# Patient Record
Sex: Female | Born: 1935
Health system: Southern US, Community
[De-identification: ages and names within clinical notes are randomized; demographics above are authoritative.]

## PROBLEM LIST (undated history)

## (undated) DIAGNOSIS — L0390 Cellulitis, unspecified: Secondary | ICD-10-CM

## (undated) HISTORY — PX: PR VEIN BYPASS GRAFT,AORTO-FEM-POP: 35551

## (undated) HISTORY — DX: Cellulitis, unspecified: L03.90

---

## 2008-09-23 ENCOUNTER — Ambulatory Visit: Payer: Self-pay | Admitting: Vascular Surgery

## 2008-11-04 ENCOUNTER — Ambulatory Visit: Payer: Self-pay | Admitting: Vascular Surgery

## 2008-11-25 ENCOUNTER — Ambulatory Visit: Payer: Self-pay | Admitting: Vascular Surgery

## 2008-12-01 ENCOUNTER — Ambulatory Visit: Payer: Self-pay | Admitting: Vascular Surgery

## 2008-12-01 ENCOUNTER — Ambulatory Visit (HOSPITAL_COMMUNITY): Admission: RE | Admit: 2008-12-01 | Discharge: 2008-12-01 | Payer: Self-pay | Admitting: Vascular Surgery

## 2008-12-16 ENCOUNTER — Ambulatory Visit: Payer: Self-pay | Admitting: Vascular Surgery

## 2008-12-19 ENCOUNTER — Inpatient Hospital Stay (HOSPITAL_COMMUNITY): Admission: RE | Admit: 2008-12-19 | Discharge: 2008-12-21 | Payer: Self-pay | Admitting: Vascular Surgery

## 2009-01-06 ENCOUNTER — Ambulatory Visit: Payer: Self-pay | Admitting: Vascular Surgery

## 2009-02-03 ENCOUNTER — Ambulatory Visit: Payer: Self-pay | Admitting: Vascular Surgery

## 2009-04-02 ENCOUNTER — Ambulatory Visit: Payer: Self-pay | Admitting: Vascular Surgery

## 2009-05-14 ENCOUNTER — Ambulatory Visit: Payer: Self-pay | Admitting: Vascular Surgery

## 2010-07-16 ENCOUNTER — Ambulatory Visit: Payer: Self-pay | Admitting: Vascular Surgery

## 2010-11-16 LAB — CBC
HCT: 30.4 % — ABNORMAL LOW (ref 36.0–46.0)
HCT: 34.3 % — ABNORMAL LOW (ref 36.0–46.0)
Hemoglobin: 10.5 g/dL — ABNORMAL LOW (ref 12.0–15.0)
Hemoglobin: 11.8 g/dL — ABNORMAL LOW (ref 12.0–15.0)
MCHC: 34.5 g/dL (ref 30.0–36.0)
MCHC: 34.7 g/dL (ref 30.0–36.0)
MCV: 94.3 fL (ref 78.0–100.0)
Platelets: 166 10*3/uL (ref 150–400)
Platelets: 211 K/uL (ref 150–400)
RBC: 3.64 MIL/uL — ABNORMAL LOW (ref 3.87–5.11)
RDW: 13.8 % (ref 11.5–15.5)
RDW: 13.9 % (ref 11.5–15.5)
WBC: 7.1 K/uL (ref 4.0–10.5)

## 2010-11-16 LAB — COMPREHENSIVE METABOLIC PANEL WITH GFR
ALT: 11 U/L (ref 0–35)
AST: 15 U/L (ref 0–37)
Albumin: 3.5 g/dL (ref 3.5–5.2)
Alkaline Phosphatase: 101 U/L (ref 39–117)
BUN: 13 mg/dL (ref 6–23)
CO2: 23 meq/L (ref 19–32)
Calcium: 9 mg/dL (ref 8.4–10.5)
Chloride: 111 meq/L (ref 96–112)
Creatinine, Ser: 1.05 mg/dL (ref 0.4–1.2)
GFR calc Af Amer: >60 mL/min (ref >60)
GFR calc non Af Amer: 52 mL/min — ABNORMAL LOW (ref >60)
Glucose, Bld: 112 mg/dL — ABNORMAL HIGH (ref 70–99)
Potassium: 4.1 meq/L (ref 3.5–5.1)
Sodium: 141 meq/L (ref 135–145)
Total Bilirubin: 0.5 mg/dL (ref 0.3–1.2)
Total Protein: 5.9 g/dL — ABNORMAL LOW (ref 6.0–8.3)

## 2010-11-16 LAB — BASIC METABOLIC PANEL
BUN: 13 mg/dL (ref 6–23)
CO2: 26 mEq/L (ref 19–32)
Calcium: 8.6 mg/dL (ref 8.4–10.5)
GFR calc non Af Amer: >60 mL/min (ref >60)
Glucose, Bld: 116 mg/dL — ABNORMAL HIGH (ref 70–99)
Potassium: 3.7 mEq/L (ref 3.5–5.1)
Sodium: 140 mEq/L (ref 135–145)

## 2010-11-16 LAB — URINALYSIS, ROUTINE W REFLEX MICROSCOPIC
Glucose, UA: NEGATIVE mg/dL
Hgb urine dipstick: NEGATIVE
Ketones, ur: NEGATIVE mg/dL
Nitrite: NEGATIVE
Protein, ur: NEGATIVE mg/dL
Specific Gravity, Urine: 1.023 (ref 1.005–1.030)
Urobilinogen, UA: 1 mg/dL (ref 0.0–1.0)
pH: 6 (ref 5.0–8.0)

## 2010-11-16 LAB — APTT: aPTT: 29 seconds (ref 24–37)

## 2010-11-16 LAB — BLOOD GAS, ARTERIAL
Acid-base deficit: 0.2 mmol/L (ref 0.0–2.0)
Bicarbonate: 23.3 meq/L (ref 20.0–24.0)
Drawn by: 181601
FIO2: 0.21 %
O2 Saturation: 97.4 %
Patient temperature: 98.6
TCO2: 24.4 mmol/L (ref 0–100)
pCO2 arterial: 34.5 mmHg — ABNORMAL LOW (ref 35.0–45.0)
pH, Arterial: 7.445 — ABNORMAL HIGH (ref 7.350–7.400)
pO2, Arterial: 85.3 mmHg (ref 80.0–100.0)

## 2010-11-16 LAB — ABO/RH: ABO/RH(D): A POS

## 2010-11-16 LAB — TYPE AND SCREEN: Antibody Screen: NEGATIVE

## 2010-11-16 LAB — PROTIME-INR: INR: 1 (ref 0.00–1.49)

## 2010-11-17 LAB — POCT I-STAT, CHEM 8
Calcium, Ion: 1.08 mmol/L — ABNORMAL LOW (ref 1.12–1.32)
Glucose, Bld: 96 mg/dL (ref 70–99)
HCT: 41 % (ref 36.0–46.0)
Hemoglobin: 13.9 g/dL (ref 12.0–15.0)

## 2010-12-21 NOTE — Assessment & Plan Note (Signed)
OFFICE VISIT   DEMIA, VIERA  DOB:  06/16/1936                                       01/06/2009  ZOXWR#:[id number]   I saw the patient in the office today for followup after her recent left  femoral to below knee pop bypass with a 6 mm Propaten graft on  12/19/2008.  This is a 75 year old woman who I have been following with  a punctate wound adjacent to her left medial malleolus.  She had  combined arterial insufficiency and chronic venous insufficiency and the  wound was not progressing.  She previously had a vein taken from both  legs and did not have any usable saphenous vein on either side by vein  mapping.  She underwent the bypass on 12/19/2008 and returns for her  first followup visit.   PHYSICAL EXAMINATION:  On examination she has a palpable femoral and  popliteal pulse and palpable dorsalis pedis pulse in the left.  The  wound looks much better in terms of the color of the granulation tissue  and it measures 1.9 cm in maximum diameter.   I have stressed with her the importance of compression therapy and  elevation given that this is a venous ulcer.  I will see her back in 1  month.  She knows to call sooner if she has problems.   Di Kindle. Edilia Bo, M.D.  Electronically Signed   CSD/MEDQ  D:  01/06/2009  T:  01/07/2009  Job:  [id number]

## 2010-12-21 NOTE — Assessment & Plan Note (Signed)
OFFICE VISIT   SISTER, [name]  DOB:  February 12, 1936                                       11/25/2008  ZOXWR#:[id number]   I saw the patient for continued follow-up of her left leg wound.  I have  been following her with a punctate wound on the aspect of her left foot.  She has had a toe pressure of 67 mmHg, suggesting adequate circulation  for healing despite the fact that she has evidence of infrainguinal  arterial occlusive disease on exam.  She comes in for a 3-week follow-up  of this.  She had no significant fever or chills.  She had no  significant drainage.  She had been doing dressing changes with  hydrogel.   On examination, the wound looks the same and measures 19 mm x 15 mm and  has thus not really improved over the last 3 weeks.  For this reason I  have recommended that we proceed with arteriography to see what options  she might have for revascularization.  As noted previously, she has  previously had vein taken from both legs and so our options for  revascularization will be limited by this.  We have also discussed the  possibility of finding an SFA lesion amenable to angioplasty.  She would  like to discuss this with her family and then will call to schedule her  arteriogram.  We have discussed the indications for arteriography and  the potential complications including but not limited to bleeding,  arterial injury and renal insufficiency.  All of her questions were  answered.  The only other possibility would be to place a VAC on the  wound, which I have discussed with her.   Tamara Kindle. [name], M.D.  Electronically Signed   CSD/MEDQ  D:  11/25/2008  T:  11/26/2008  Job:  [id number]

## 2010-12-21 NOTE — Assessment & Plan Note (Signed)
OFFICE VISIT   Tamara Johnson, Tamara Johnson  DOB:  09-28-1935                                       05/14/2009  EAVWU#:[id number]   I saw the patient for continued followup of her left medial malleolar  wound.  This is a 75 year old woman who had a punctate wound adjacent to  her left medial malleolus for some time.  She has both combined arterial  insufficiency and chronic venous insufficiency.  Her arteriogram showed  a long segment superficial femoral artery occlusion and she underwent a  left femoral to below knee popliteal artery bypass with a 6 mm Propaten  graft on 12/19/2008.  Of note, she had undergone vein mapping which did  not show any usable saphenous vein for autogenous conduit.  She comes in  for a routine wound check.  Of note, she has had no fever or chills.  She has had no chest pain or chest pressure.   PHYSICAL EXAMINATION:  Blood pressure is 158/82, heart rate is 61.  She  has a palpable left femoral pulse.  I cannot palpate a popliteal pulse  but she has fairly brisk popliteal, anterior tibial and posterior tibial  and peroneal signal at the Doppler.  The wound has epithelialized with  really no open area at this point.   At this point I have recommended that she keep the skin well lubricated  with Eucerin but she did not need to continue any dressing changes as  the wound has now epithelialized.  We will follow her fem-pop graft on  our protocol and I plan on seeing her back in 6 months.  She knows to  call sooner if she has problems.  Unfortunately she does continue to  smoke and she understands this can certainly affect graft patency and  wound healing.   Di Kindle. Edilia Bo, M.D.  Electronically Signed   CSD/MEDQ  D:  05/14/2009  T:  05/15/2009  Job:  [id number]

## 2010-12-21 NOTE — Consult Note (Signed)
NEW PATIENT CONSULTATION   CALYNN, FERRERO  DOB:  April 04, 1936                                       09/23/2008  ZOXWR#:[id number]   I saw this patient in the office today in consultation concerning a  wound on her left foot.  This is a pleasant 75 year old woman who 2  weeks ago developed a small punctate wound adjacent to her medial  malleolus on the left foot.  She had been doing dressing changes for  this.  She was noted to have diminished pedal pulses and sent for  vascular evaluation.  She had a Doppler study done at [facility]  which showed an ABI of 60% on the right and 79% on the left with  monophasic Doppler signals in both feet.   Of note, the patient does give a 2-year history of calf claudication in  the right calf which occurs at less than half a block.  These symptoms  have been stable over the last 2 years.  There are no other aggravating  or alleviating factors associated with this.  She has also had some  occasional paresthesias along the lateral aspect of the right leg which  she has also had for 2 years.  She denies any history of rest pain.  She  did previously have an ulcer on the lateral left leg which healed.  She  does have a history of a venous disease.  She had multiple previous  DVTs.  Her most recent DVT was in the left leg approximately 5 years  ago.  She was on Coumadin temporarily but is currently not on Coumadin.   PAST MEDICAL HISTORY:  Fairly unremarkable.  She denies any history of  diabetes, hypertension, hypercholesterolemia, history of previous  myocardial infarction, history of congestive heart failure, or history  of COPD.   FAMILY HISTORY:  There is no history of premature cardiovascular  disease.   SOCIAL HISTORY:  She is married.  She has 1 child.  She smokes a pack  per day of cigarettes, although she is currently trying to quit.  She is  taking Chantix for this.   REVIEW OF SYSTEMS:  Unremarkable except for  her claudication and  nonhealing ulcer, and is documented on the medical history form in her  chart.   MEDICATIONS:  Likewise her medications are documented.   PHYSICAL EXAMINATION:  This is a pleasant 75 year old woman who appears  her stated age.  Blood pressure is 137/77, heart rate is 64.  HEENT:  Unremarkable.  Neck is supple.  No cervical lymphadenopathy.  I do not  detect any carotid bruits.  Lungs are clear bilaterally to auscultation.  Cardiac exam she has a regular rate and rhythm.  Abdomen:  Soft and  nontender.  She has normal pitched bowel sounds.  I cannot palpate an  aneurysm.  She has a slightly diminished right femoral pulse with normal  left femoral pulse.  I cannot palpate popliteal or pedal pulses on  either side.  She has a monophasic dorsalis pedis and posterior tibial  signal bilaterally.  She has a small punctate 5 mm wound adjacent her  left medial malleolus with no significant drainage or cellulitis  currently.  Neurologic:  Exam is nonfocal.   She did have a Doppler study at [facility] that showed an ABI of  60% on the  right and 79% on the left.   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  However, appears that she has adequate circulation to heal  this.  I have explained that if it does not heal, I think we need to  proceed with arteriography.  I think it is probably a venous stasis  ulcer.  She has significant chronic venous insufficiency.  I have  discussed the importance of some mild compression along with her  dressing changes and also intermittent leg elevation.  If her peripheral  vascular disease prohibits her from elevating her leg, this might be  another reason to consider proceeding with arteriography.  However, she  has currently only had the wound for 2 weeks and it is quite small so I  think it is reasonable to simply check  this again in 6 week.  She knows  to call sooner if the wound progresses.  Hopefully, this will gradually  improve without any further intervention.  We have also had a long  discussion about the importance of tobacco cessation.   Di Kindle. Edilia Bo, M.D.  Electronically Signed   CSD/MEDQ  D:  09/23/2008  T:  09/24/2008  Job:  [id number]   cc:   Sharyne Peach

## 2010-12-21 NOTE — Assessment & Plan Note (Signed)
OFFICE VISIT   Tamara Johnson, Tamara Johnson  DOB:  Aug 19, 1935                                       11/04/2008  JWJXB#:[id number]   I saw the patient in the office today for continued followup of her left  foot wound.  I had seen her in consultation on September 23, 2008, with a  small punctate wound adjacent to her left medial malleolus.  At that  time she had a normal femoral pulse with monophasic pedal signals and it  was felt she had infrainguinal arterial occlusive disease bilaterally.  I felt she likely had adequate circulation to heal this wound, but she  comes in for a routine visit.  Unfortunately, since I saw her last, her  husband was on a ventilator for 11 days in the hospital and she spent a  tremendous amount of time at the hospital on her feet and the wound has  progressed some.  She comes in today for a followup visit.  She had no  fever or chills.   On examination, she has a normal femoral pulse on the left with  monophasic Doppler signals in the left foot.  She had ABIs done in the  office today which were 57% on the right and 67% on the left.  Of note,  her toe pressure on the left was 67 mmHg.  The wound measures 2 cm x 1.5  cm in diameter.  There was some fibrin tissue which I tried to debride,  although it is somewhat tender.   I have encouraged her to try to spend less time on her feet now that her  husband is doing better and to continue with dressing changes with  hydrogel daily.  I think the wound has increased in size related to her  being on her feet so much and will have her also use a 4-inch Ace for  mild compression of the wound.  I plan on seeing her back in 3 weeks.  If this wound progresses, I think we need to proceed with arteriography  and evaluate her for a bypass.  She has had a vein previously taken from  both legs but could potentially benefit from prosthetic bypass.  Of  note, however, her toe pressure is 67 mmHg, which does  suggest adequate  circulation for healing.  Hopefully, the wound will start to show some  signs of improvement now that she can stay off her feet some.  She knows  to call sooner if she has problems.   Di Kindle. Edilia Bo, M.D.  Electronically Signed   CSD/MEDQ  D:  11/04/2008  T:  11/05/2008  Job:  [id number]   cc:   Sharyne Peach

## 2010-12-21 NOTE — Assessment & Plan Note (Signed)
OFFICE VISIT   AVION, PATELLA  DOB:  Sep 02, 1935                                       02/03/2009  ZOXWR#:[id number]   I saw the patient in the office today for continued followup of her  wound on her left foot on the medial malleolus.  When I saw her last  this measured 1.9 cm in maximum diameter.  She comes in today for a  wound check.  She is doing well except that her husband is now being  evaluated by hospice.  She has not been elevating her leg as much as  normal for this reason.  She has had no significant drainage and no  fever.   PHYSICAL EXAMINATION:  On examination she has a 1+ dorsalis pedis pulse  on the left foot.  The foot is warm and well-perfused.  This wound still  measures 1.9 cm in maximum diameter and is thus not changed  significantly in size.  For this reason I have recommended we try the  VAC to help stimulate granulation tissue and hopefully get this to  contract quicker.  We will arrange for this to be changed on Mondays,  Wednesdays and Fridays and I will see her back in 2 weeks.  She knows to  call sooner if she has problems.   Di Kindle. Edilia Bo, M.D.  Electronically Signed   CSD/MEDQ  D:  02/03/2009  T:  02/04/2009  Job:  [id number]

## 2010-12-21 NOTE — Op Note (Signed)
Tamara Johnson, Tamara Johnson NO.:  [id number]   MEDICAL RECORD NO.:  [id number]          PATIENT TYPE:  INP   LOCATION:  2024                         FACILITY:  [facility]   PHYSICIAN:  Di Kindle. Edilia Bo, M.D.DATE OF BIRTH:  [birth year]   DATE OF PROCEDURE:  12/19/2008  DATE OF DISCHARGE:                               OPERATIVE REPORT   PREOPERATIVE DIAGNOSIS:  Nonhealing wound of the left leg with  superficial femoral artery occlusion.   POSTOPERATIVE DIAGNOSIS:  Nonhealing wound of the left leg with  superficial femoral artery occlusion.   PROCEDURE:  Left femoral to below-knee popliteal artery bypass graft  with a 6-mm PROPATEN graft and intraoperative arteriogram.   SURGEON:  Di Kindle. Edilia Bo, MD   ASSISTANT:  Billee Cashing, RNFA   ANESTHESIA:  General.   INDICATIONS:  This is a 75 year old woman who I have been following with  a punctate wound adjacent to her left medial malleolus.  She has  combined arterial insufficiency and chronic venous insufficiency.  The  wound failed to heal despite the toe pressure of 67, which suggested she  had reasonable circulation.  An arteriogram was obtained, which showed a  superficial femoral artery occlusion with reconstitution of the  popliteal artery at the level of the knee.  Fem-pop bypass grafting was  recommended as her best chance for limb salvage.  Of note, she has  significant chronic venous insufficiency and has had both saphenous vein  stripped from her legs.  Vein mapping did not show any usable saphenous  vein for autogenous conduit.  The procedure and potential complications  including, but not limited to bleeding, wound healing problems, graft  thrombosis and limb loss were all discussed with the patient  preoperatively.  All her questions were answered.  She was agreeable to  proceed.   TECHNIQUE:  The patient was taken to the operating room and received a  general anesthetic.  The left lower extremity  was prepped and draped in  usual sterile fashion.  After the incision was made longitudinally in  the left groin, the common femoral artery was dissected free along with  one posterior branch.  There was an excellent pulse here.  I did ligate  some venous branches in the groin to lower the venous pressure given her  significant chronic venous insufficiency.  Separate longitudinal  incision was made below-the-knee on the medial aspect of leg and the  popliteal space was entered.  The below-knee popliteal artery was  exposed and was patent.  A tunnel was created between the 2 incisions,  and a 6-mm PROPATEN graft tunneled between the 2 incisions.  The patient  was then heparinized.  Attention was first turned to the proximal  anastomosis.  The common femoral artery was clamped proximally and  distally and a longitudinal arteriotomy was made.  The PROPATEN graft  was spatulated and sewn end-to-side to the common femoral artery using  continuous 6-0 Prolene suture.  The proximal anastomosis was flushed  first and there was an excellent inflow.  Anastomosis was completed and  the graft clamped.  The graft  was pulled to the appropriate tension for  anastomosis to the below-knee popliteal artery.  A tourniquet was placed  on the thigh.  The leg was exsanguinated with an Esmarch bandage and the  tourniquet inflated to 250 mmHg.  Under tourniquet control, a  longitudinal arteriotomy was made in the below-knee popliteal artery.  The graft was cut to appropriate length, spatulated and sewn end-to-side  to the artery using continuous 6-0 Prolene suture.  At the completion,  there was an excellent signal with the foot with the Doppler, which was  graft dependent.  Hemostasis was obtained in the wounds, and the heparin  was partially reversed with protamine.  Below-knee incision was closed  with a deep layer of 3-0 Vicryl and the skin closed with 4-0  subcuticular stitch.  The groin incision was closed  with a deep layer of  2-0 Vicryl.  Subcutaneous layer was closed with 2-0 Vicryl.  The skin  was closed with a 4-0 subcuticular stitch.  Sterile dressing was  applied.  The patient tolerated the procedure well and was transferred  to the recovery room in satisfactory condition.  All needle and sponge  counts were correct.      Di Kindle. Edilia Bo, M.D.  Electronically Signed     CSD/MEDQ  D:  12/19/2008  T:  12/20/2008  Job:  [id number]

## 2010-12-21 NOTE — H&P (Signed)
HISTORY AND PHYSICAL EXAMINATION   Dec 16, 2008   Re:  Tamara Johnson, BEHAN                  DOB:  19-Oct-1935   REASON FOR ADMISSION:  Nonhealing wound of the left foot.   HISTORY:  This is a pleasant 75 year old woman who I have been following  with a punctate wound adjacent to her left medial malleolus.  I  originally saw her in consultation on 09/23/2008.  She has a toe  pressure on the left at 67 mmHg suggesting adequate circulation for  healing.  However, the wound really did not show any significant  improvement despite aggressive outpatient care.  She underwent an  arteriogram on 12/01/2008 which showed a long segment occlusion of her  superficial femoral artery with reconstitution of her below knee  popliteal artery and three vessel runoff on the left.  On her most  recent visit today, 12/16/2008, the wound has not shown any significant  improvement.  She did undergo vein mapping today and there was no usable  saphenous vein remaining, as she had vein taken from both legs.  We also  looked at her cephalic veins which were small on both sides and not  usable.  She is admitted for fem-pop bypass grafting as her best chance  for limb salvage.   PAST MEDICAL HISTORY:  Is fairly unremarkable.  She denies any history  of diabetes, hypertension, hypercholesterolemia, history of previous  myocardial infarction, history of congestive heart failure or history of  COPD.   FAMILY HISTORY:  There is no history of premature cardiovascular  disease.   SOCIAL HISTORY:  She is married.  She has one child.  She smokes a pack  per day of cigarettes although she is in the process of trying to quit.   REVIEW OF SYSTEMS:  GENERAL:  She has had no recent weight loss, weight  gain or problems with her appetite.  CARDIAC:  She has had no chest pain, chest pressure, palpitations or  arrhythmias.  PULMONARY:  She has had no productive cough, bronchitis, asthma or  wheezing.  GI:   She has had no recent change in her bowel habits and has no history  of peptic ulcer disease.  GU:  She has had no dysuria or frequency.  VASCULAR:  She has had no history of DVT or phlebitis.  She has had  bilateral lower extremity claudication more significantly on the left  side.  She has had no history of stroke, TIAs or amaurosis fugax.  NEURO:  She has had no dizziness, blackouts, headaches or seizures.  ORTHO:  She has had no arthritis, joint pain, muscle pain or rash.  ENT:  She has had no recent change in her eyesight.  No change in her  hearing.  HEMATOLOGIC:  She has had no bleeding problems or clotting disorders.   ALLERGIES:  Aspirin.   MEDICATIONS:  Zyrtec and Chantix.   PHYSICAL EXAMINATION:  This is a pleasant 75 year old woman who appears  her stated age.  Her blood pressure is 149/69, heart rate is 70.  HEENT  is unremarkable.  I do not detect any carotid bruits.  Neck is supple.  Lungs are clear bilaterally to auscultation.  On cardiac exam she has a  regular rate and rhythm.  Her abdomen is soft and nontender.  She has  normal pitched bowel sounds.  I cannot palpate an aneurysm.  She has  palpable femoral pulses bilaterally.  I cannot palpate popliteal or  pedal pulses on either side.  She has monophasic Doppler signals in her  feet bilaterally.  She has a 2 cm in diameter wound adjacent to the  medial malleolus of the left foot.   Despite aggressive outpatient wound care the wound has really not shown  any evidence of improvement and actually has gotten slightly larger.  I  feel the only chance for limb salvage is attempted revascularization.  Unfortunately she does not have adequate vein for an autogenous bypass.  I have recommended left femoral to below knee popliteal artery bypass  grafting.  We have discussed the indications for the procedure and the  potential complications including but not limited to graft thrombosis,  wound healing problems, bleeding and  limb loss.  All of her questions  were answered and she is agreeable to proceed.  Her surgery has been  scheduled for May 14.   Di Kindle. Edilia Bo, M.D.  Electronically Signed   CSD/MEDQ  D:  12/16/2008  T:  12/17/2008  Job:  [id number]

## 2010-12-21 NOTE — Assessment & Plan Note (Signed)
OFFICE VISIT   Tamara Johnson, Tamara Johnson  DOB:  May 22, 1936                                       04/02/2009  OZHYQ#:[id number]   I saw the patient in the office today for continued followup of her left  medial malleolar wound.  She has had a left fem-pop bypass graft which  was done with a 6 mm Propaten graft on 12/19/2008.  She had a VAC on  this wound and comes in today for a routine wound check.  Of note, since  I saw her last she lost her husband who was in hospice 22 days before  dying.   On examination blood pressure is 124/79, heart rate is 66.  The foot is  warm and well-perfused.  The wound measures 1 cm in maximum diameter.   I debrided some fibrin tissue today and the wound appears reasonably  well-perfused.  At this point I think we will switch to wet-to-dry  dressing changes with a 2x2 to stimulate granulation and hopefully this  will now epithelialize and completely heal.  She feels comfortable doing  the dressing changes so she will do this twice a day.  I will see her  back in 4 weeks.  She knows to call sooner if she has problems.   Di Kindle. Edilia Bo, M.D.  Electronically Signed   CSD/MEDQ  D:  04/02/2009  T:  04/03/2009  Job:  [id number]

## 2010-12-21 NOTE — Op Note (Signed)
NAMEMAUDE, HETTICH                ACCOUNT NO.:  [id number]   MEDICAL RECORD NO.:  [id number]          PATIENT TYPE:  AMB   LOCATION:  SDS                          FACILITY:  [facility]   PHYSICIAN:  Di Kindle. Edilia Bo, M.D.DATE OF BIRTH:  19-Dec-1935   DATE OF PROCEDURE:  12/01/2008  DATE OF DISCHARGE:                               OPERATIVE REPORT   PREOPERATIVE DIAGNOSIS:  Nonhealing wound of the left foot with  infrainguinal arterial occlusive disease.   POSTOPERATIVE DIAGNOSIS:  Nonhealing wound of the left foot with  infrainguinal arterial occlusive disease.   PROCEDURES:  1. Ultrasound-guided access to the right common femoral artery.  2. Aortogram with bilateral lower extremity runoff.  3. Selective catheterization of the left external iliac artery.  4. Femoral artery closure with Perclose ProGlide device.   INDICATIONS:  This is a pleasant 75 year old woman who I have been  following with a punctate wound adjacent to her left medial malleolus.  This has not really shown any evidence of shrinkage despite aggressive  outpatient wound care.  She has evidence of infrainguinal arterial  occlusive disease on exam, although she had a toe pressure of 67 mmHg,  the wound has not really improved.  She is brought in for diagnostic  arteriography to determine if she is a candidate for revascularization.   TECHNIQUE:  The patient was taken to the PV lab and sedated with a  milligram of Versed and 50 mcg of fentanyl.  Both groins were prepped  and draped in the usual sterile fashion.  The skin was anesthetized with  1% lidocaine and under ultrasound guidance, the right common femoral  artery was cannulated and a guidewire introduced into the infrarenal  aorta under fluoroscopic control.  A 5-French sheath was introduced over  the wire.  Pigtail catheter was positioned at the L1 vertebral body and  flush aortogram obtained.  The catheter was then repositioned above the  aortic  bifurcation and oblique iliac projections were obtained.  The  pigtail catheter was then exchanged for an IMA catheter, which was  positioned into the proximal left common iliac artery.  An angled  Glidewire was advanced down into the external iliac artery on the left  and then the IMA catheter exchanged for the end-hole catheter.  Selective left external iliac arteriogram was obtained with left lower  extremity runoff.  The end-hole catheter was then removed and right  lower extremity films were obtained through the right femoral sheath.  At the completion of the procedure, the 5-French sheath was exchanged  for the Perclose device, which was advanced over the wire.  The wire was  then removed and then the catheter advanced until there was good blood  return from the marker port.  The foot was then deployed and the  catheter retracted until blood to the marker port stopped.  The suture  was then cut and then the foot disengaged and the catheter retracted.  Attention was maintained on the rail suture and then this was tightened  using the Enclose device.  The suture was then cut and there was good  hemostasis obtained.  Pressure was held for 2 minutes.   FINDINGS:  There were single renal arteries bilaterally with no  significant renal artery stenosis identified.  There is mild diffuse  disease in the infrarenal aorta, but no focal stenosis.  Bilateral  common iliac, external iliac, and hypogastric arteries are patent  bilaterally.   On the left side, the superficial femoral artery is occluded at its  origin.  There is reconstitution of the popliteal artery at the level of  the knee with 3-vessel runoff via the anterior tibial, peroneal, and  posterior tibial arteries.  There are extensive collaterals.  On the  right side, the superficial femoral artery is occluded at its origin.  The common femoral and deep femoral arteries are patent.  There was  reconstitution of the above-knee  popliteal artery with some mild disease  above the knee.  There was three-vessel runoff on the right via the  anterior tibial, posterior tibial, and peroneal arteries.   CONCLUSIONS:  1. Long segment SFA occlusion on the left with patent below-knee      popliteal and three-vessel runoff.  2. Long segment SFA occlusion on the right with patent popliteal      artery and three-vessel runoff.      Di Kindle. Edilia Bo, M.D.  Electronically Signed     CSD/MEDQ  D:  12/01/2008  T:  12/01/2008  Job:  [id number]   cc:   Sharyne Peach

## 2010-12-21 NOTE — Procedures (Signed)
VASCULAR LAB EXAM   INDICATION:  Preop vein mapping, bilateral lower extremity vein  stripping per patient.   HISTORY:  Diabetes:  No.  Cardiac:  No.  Hypertension:  No.   EXAM:  Bilateral greater saphenous vein, short saphenous vein, and  cephalic veins were mapped.   See attached worksheet.   IMPRESSION:  1. Right greater saphenous vein shows evidence of stripping.  2. Left greater saphenous vein shows evidence of stripping with a      short straight segment in the proximal thigh and a very short      segment in proximal calf.  3. Bilateral short saphenous veins appear very small, dropping to <0.2      cm by midcalf.  4. Bilateral cephalic veins appear <0.2 cm throughout.   ___________________________________________  Di Kindle. Edilia Bo, M.D.   AS/MEDQ  D:  12/16/2008  T:  12/16/2008  Job:  [id number]

## 2010-12-24 NOTE — Discharge Summary (Signed)
NAMELASSIE, DEMOREST NO.:  [id number]   MEDICAL RECORD NO.:  [id number]          PATIENT TYPE:  INP   LOCATION:  2024                         FACILITY:  [facility]   PHYSICIAN:  Di Kindle. Edilia Bo, M.D.DATE OF BIRTH:  [birth year]   DATE OF ADMISSION:  12/19/2008  DATE OF DISCHARGE:  12/21/2008                               DISCHARGE SUMMARY   FINAL DISCHARGE DIAGNOSES:  1. Ischemic left lower extremity resulting in nonhealing wound      adjacent to the left medial malleolus.  2. History of tobacco abuse.   PROCEDURES PERFORMED:  Left femoral to below-knee popliteal artery  bypass grafting with a 6-mm Propaten graft and intraoperative  arteriogram by Dr. Edilia Bo on Dec 19, 2008.   COMPLICATIONS:  None.   CONDITION AT DISCHARGE:  Stable improving.   DISCHARGE MEDICATIONS:  She is instructed to resume all previously  scheduled medications consisting of Zyrtec p.r.n., Chantix 1 mg p.o.  b.i.d.  She was given a prescription for Tylox 1-2 p.o. q.4 h. p.r.n.  pain total #30 were given.   DISPOSITION:  Following careful instructions regarding the care of her  wounds and activity level, she was discharged home.  She was given a  return appointment with Dr. Edilia Bo in 2 weeks.  The office will arrange  a visit.   BRIEF IDENTIFYING STATEMENT:  For complete details, please refer the  typed history and physical.  Briefly, this very pleasant 75 year old  woman has a fairly unremarkable medical history.  She has developed a  poorly healing wound on her left medial malleolus.  Arteriogram  demonstrated long segment occlusion of her superficial femoral artery on  her left.  She had good reconstitution with three-vessel runoff below  the knee.  Dr. Edilia Bo recommended left femoral to popliteal bypass  grafting to improve her wound healing ability.  She was informed of the  risks and benefits of the procedure and after careful consideration he  elected to proceed with  surgery.   HOSPITAL COURSE:  Preoperative workup was completed as an outpatient.  She was brought in through same-day surgery on Dec 19, 2008 and  underwent the aforementioned revascularization procedure.  For complete  details, please refer the typed operative report.  The procedure was  without complication.  She was returned to the post anesthesia care unit  extubated.  Following stabilization, she was transferred to a bed on a  surgical step-down unit.  She was observed overnight and was able to be transferred to a bed on a  surgical convalescent floor.  Following her transfer, her diet and  activity level were increased as tolerated.  She was able to walk with  the use of a rolling walker.  She was felt stable and was discharged  home on Dec 21, 2008.      Wilmon Arms, PA      Di Kindle. Edilia Bo, M.D.  Electronically Signed    KEL/MEDQ  D:  12/22/2008  T:  12/23/2008  Job:  [id number]

## 2012-06-28 ENCOUNTER — Encounter: Payer: Self-pay | Admitting: Vascular Surgery

## 2013-10-02 DIAGNOSIS — L259 Unspecified contact dermatitis, unspecified cause: Secondary | ICD-10-CM | POA: Diagnosis not present

## 2013-12-17 DIAGNOSIS — L97909 Non-pressure chronic ulcer of unspecified part of unspecified lower leg with unspecified severity: Secondary | ICD-10-CM | POA: Diagnosis not present

## 2013-12-17 DIAGNOSIS — L981 Factitial dermatitis: Secondary | ICD-10-CM | POA: Diagnosis not present

## 2013-12-17 DIAGNOSIS — I8310 Varicose veins of unspecified lower extremity with inflammation: Secondary | ICD-10-CM | POA: Diagnosis not present

## 2014-01-01 DIAGNOSIS — L259 Unspecified contact dermatitis, unspecified cause: Secondary | ICD-10-CM | POA: Diagnosis not present

## 2014-01-01 DIAGNOSIS — L97909 Non-pressure chronic ulcer of unspecified part of unspecified lower leg with unspecified severity: Secondary | ICD-10-CM | POA: Diagnosis not present

## 2014-01-08 DIAGNOSIS — D1801 Hemangioma of skin and subcutaneous tissue: Secondary | ICD-10-CM | POA: Diagnosis not present

## 2014-01-08 DIAGNOSIS — L570 Actinic keratosis: Secondary | ICD-10-CM | POA: Diagnosis not present

## 2014-01-08 DIAGNOSIS — L97909 Non-pressure chronic ulcer of unspecified part of unspecified lower leg with unspecified severity: Secondary | ICD-10-CM | POA: Diagnosis not present

## 2014-01-08 DIAGNOSIS — L820 Inflamed seborrheic keratosis: Secondary | ICD-10-CM | POA: Diagnosis not present

## 2014-01-15 DIAGNOSIS — L97909 Non-pressure chronic ulcer of unspecified part of unspecified lower leg with unspecified severity: Secondary | ICD-10-CM | POA: Diagnosis not present

## 2014-01-22 DIAGNOSIS — L97909 Non-pressure chronic ulcer of unspecified part of unspecified lower leg with unspecified severity: Secondary | ICD-10-CM | POA: Diagnosis not present

## 2014-01-29 DIAGNOSIS — L97909 Non-pressure chronic ulcer of unspecified part of unspecified lower leg with unspecified severity: Secondary | ICD-10-CM | POA: Diagnosis not present

## 2014-02-05 DIAGNOSIS — L97909 Non-pressure chronic ulcer of unspecified part of unspecified lower leg with unspecified severity: Secondary | ICD-10-CM | POA: Diagnosis not present

## 2014-02-12 DIAGNOSIS — L97909 Non-pressure chronic ulcer of unspecified part of unspecified lower leg with unspecified severity: Secondary | ICD-10-CM | POA: Diagnosis not present

## 2014-02-19 DIAGNOSIS — L97909 Non-pressure chronic ulcer of unspecified part of unspecified lower leg with unspecified severity: Secondary | ICD-10-CM | POA: Diagnosis not present

## 2014-02-26 DIAGNOSIS — L259 Unspecified contact dermatitis, unspecified cause: Secondary | ICD-10-CM | POA: Diagnosis not present

## 2014-02-26 DIAGNOSIS — L97909 Non-pressure chronic ulcer of unspecified part of unspecified lower leg with unspecified severity: Secondary | ICD-10-CM | POA: Diagnosis not present

## 2014-03-05 DIAGNOSIS — L259 Unspecified contact dermatitis, unspecified cause: Secondary | ICD-10-CM | POA: Diagnosis not present

## 2014-03-05 DIAGNOSIS — L97909 Non-pressure chronic ulcer of unspecified part of unspecified lower leg with unspecified severity: Secondary | ICD-10-CM | POA: Diagnosis not present

## 2014-10-04 DIAGNOSIS — M545 Low back pain: Secondary | ICD-10-CM | POA: Diagnosis not present

## 2014-12-17 DIAGNOSIS — H3531 Nonexudative age-related macular degeneration: Secondary | ICD-10-CM | POA: Diagnosis not present

## 2015-02-12 DIAGNOSIS — H3531 Nonexudative age-related macular degeneration: Secondary | ICD-10-CM | POA: Diagnosis not present

## 2015-04-29 DIAGNOSIS — Z23 Encounter for immunization: Secondary | ICD-10-CM | POA: Diagnosis not present

## 2015-09-20 DIAGNOSIS — L0390 Cellulitis, unspecified: Secondary | ICD-10-CM | POA: Diagnosis not present

## 2015-09-28 ENCOUNTER — Encounter: Payer: Self-pay | Admitting: Vascular Surgery

## 2015-09-28 ENCOUNTER — Other Ambulatory Visit: Payer: Self-pay

## 2015-09-28 DIAGNOSIS — R230 Cyanosis: Secondary | ICD-10-CM

## 2015-10-08 DIAGNOSIS — L255 Unspecified contact dermatitis due to plants, except food: Secondary | ICD-10-CM | POA: Diagnosis not present

## 2015-10-16 ENCOUNTER — Encounter: Payer: Self-pay | Admitting: Vascular Surgery

## 2015-10-19 ENCOUNTER — Ambulatory Visit (HOSPITAL_COMMUNITY)
Admission: RE | Admit: 2015-10-19 | Discharge: 2015-10-19 | Disposition: A | Discharge status: Home / Self Care | Payer: Medicare Other | Source: Ambulatory Visit | Attending: Vascular Surgery | Admitting: Vascular Surgery

## 2015-10-19 ENCOUNTER — Ambulatory Visit (INDEPENDENT_AMBULATORY_CARE_PROVIDER_SITE_OTHER)
Admission: RE | Admit: 2015-10-19 | Discharge: 2015-10-19 | Disposition: A | Discharge status: Home / Self Care | Payer: Medicare Other | Source: Ambulatory Visit | Attending: Vascular Surgery | Admitting: Vascular Surgery

## 2015-10-19 DIAGNOSIS — R938 Abnormal findings on diagnostic imaging of other specified body structures: Secondary | ICD-10-CM | POA: Insufficient documentation

## 2015-10-19 DIAGNOSIS — R230 Cyanosis: Secondary | ICD-10-CM | POA: Insufficient documentation

## 2015-10-19 DIAGNOSIS — R0989 Other specified symptoms and signs involving the circulatory and respiratory systems: Secondary | ICD-10-CM | POA: Diagnosis present

## 2015-10-21 ENCOUNTER — Ambulatory Visit (INDEPENDENT_AMBULATORY_CARE_PROVIDER_SITE_OTHER): Payer: Medicare Other | Admitting: Vascular Surgery

## 2015-10-21 ENCOUNTER — Encounter: Payer: Self-pay | Admitting: Vascular Surgery

## 2015-10-21 VITALS — BP 136/75 | HR 76 | Temp 97.1°F | Resp 18 | Ht 66.6 in | Wt 133.0 lb

## 2015-10-21 DIAGNOSIS — I739 Peripheral vascular disease, unspecified: Secondary | ICD-10-CM | POA: Diagnosis not present

## 2015-10-21 DIAGNOSIS — L03116 Cellulitis of left lower limb: Secondary | ICD-10-CM | POA: Diagnosis not present

## 2015-10-21 NOTE — Progress Notes (Signed)
Referring Physician: Dr.Hawk History of Present Illness:  Patient is a 80 y.o. year old female who presents for evaluation of cellulitis bilateral LE.  She had itching that started 6 weeks ago and began to scatch her legs enough that they became red.  She saw her family doctor who ordered Keflex BID with triamcinolone cream topical for the itching.    She was sent her for F/U to evaluate her Fem-pop by pass that was performed by Dr. Scot Dock in 2010.  She states she walks as much aas she wants and has no calf pain.  She has not had any open ulcers since her surgery reoccur.  She completely healed her medial ankle ulcer that was present prior to her fem-pop by pass.  Other medical problems include  does not have a problem list on file.  She denise HTN, DM, hyperlipidemia.  She does still smoke.  Past Medical History  Diagnosis Date  . Cellulitis     Past Surgical History  Procedure Laterality Date  . Pr vein bypass graft,aorto-fem-pop      Social History Social History  Substance Use Topics  . Smoking status: Current Every Day Smoker -- 1.00 packs/day    Types: Cigarettes  . Smokeless tobacco: Never Used  . Alcohol Use: No    Family History Family History  Problem Relation Age of Onset  . Heart attack Father     Allergies  Allergies  Allergen Reactions  . Asa [Aspirin]      Current Outpatient Prescriptions  Medication Sig Dispense Refill  . Multiple Vitamins-Minerals (ICAPS AREDS 2) CAPS Take by mouth.    . triamcinolone ointment (KENALOG) 0.5 %   0   No current facility-administered medications for this visit.    ROS:   General:  No weight loss, Fever, chills  HEENT: No recent headaches, no nasal bleeding, no visual changes, no sore throat  Neurologic: No dizziness, blackouts, seizures. No recent symptoms of stroke or mini- stroke. No recent episodes of slurred speech, or temporary blindness.  Cardiac: No recent episodes of chest pain/pressure, no shortness  of breath at rest.  No shortness of breath with exertion.  Denies history of atrial fibrillation or irregular heartbeat  Vascular: No history of rest pain in feet.  No history of claudication.  No history of non-healing ulcer, No history of DVT   Pulmonary: No home oxygen, no productive cough, no hemoptysis,  No asthma or wheezing  Musculoskeletal:  [ ]  Arthritis, [ ]  Low back pain,  [ ]  Joint pain  Hematologic:No history of hypercoagulable state.  No history of easy bleeding.  No history of anemia  Gastrointestinal: No hematochezia or melena,  No gastroesophageal reflux, no trouble swallowing  Urinary: [ ]  chronic Kidney disease, [ ]  on HD - [ ]  MWF or [ ]  TTHS, [ ]  Burning with urination, [ ]  Frequent urination, [ ]  Difficulty urinating;   Skin: positive rashes  Psychological: No history of anxiety,  No history of depression   Physical Examination  Filed Vitals:   10/21/15 1430  BP: 136/75  Pulse: 76  Temp: 97.1 F (36.2 C)  Resp: 18  Height: 5' 6.6" (1.692 m)  Weight: 133 lb (60.328 kg)  SpO2: 98%    Body mass index is 21.07 kg/(m^2).  General:  Alert and oriented, no acute distress HEENT: Normal Neck: No bruit or JVD Pulmonary: Clear to auscultation bilaterally Cardiac: Regular Rate and Rhythm without murmur Abdomen: Soft, non-tender, non-distended, no mass, no  scars Skin: bilateral mild erythema with excoriations on the lower legs.  No ulcers, medical ankle well healed scar. Extremity Pulses:  2+ radial, brachial, femoral, dorsalis pedis,  pulses bilaterally Musculoskeletal: No deformity or edema  Neurologic: Upper and lower extremity motor 5/5 and symmetric  DATA:   ABI's 0.6 bilaterally with toe pressure <0.69 bilaterally Duplex shows no flow in the left fem-pop by pass graft.   ASSESSMENT:  PAD s/p fem-pop by pass left LE. Cellulitis healing with the aide of Keflex times 7 days and topical triamcinolone.    PLAN:   The left fem-pop is occluded.  She  has developed collaterals over time due to her activity level and walking daily.  She has DP palpable pulses bilaterally.  She has no op ulcers and the erythema is improving per her and her son.    She will f/u in 1 year for repeat ABI's.  If she has troubles or concerns before that she will call us sooner.  Continue activity as tolerates.  Continue to keep skin moisturized daily.  She may want to try benadryl topical as an adjunct.   We recommend that she stop smoking.  Theda Sers Reiana [facility] PA-C [facility] Office: [phone]  The patient was seen in conjunction with Dr. Scot Dock

## 2016-02-10 DIAGNOSIS — L300 Nummular dermatitis: Secondary | ICD-10-CM | POA: Diagnosis not present

## 2016-02-10 DIAGNOSIS — L299 Pruritus, unspecified: Secondary | ICD-10-CM | POA: Diagnosis not present

## 2016-02-10 DIAGNOSIS — I8310 Varicose veins of unspecified lower extremity with inflammation: Secondary | ICD-10-CM | POA: Diagnosis not present

## 2016-03-14 DIAGNOSIS — L209 Atopic dermatitis, unspecified: Secondary | ICD-10-CM | POA: Diagnosis not present

## 2016-03-14 DIAGNOSIS — L97911 Non-pressure chronic ulcer of unspecified part of right lower leg limited to breakdown of skin: Secondary | ICD-10-CM | POA: Diagnosis not present

## 2016-03-14 DIAGNOSIS — L299 Pruritus, unspecified: Secondary | ICD-10-CM | POA: Diagnosis not present

## 2016-03-21 DIAGNOSIS — L97911 Non-pressure chronic ulcer of unspecified part of right lower leg limited to breakdown of skin: Secondary | ICD-10-CM | POA: Diagnosis not present

## 2016-03-21 DIAGNOSIS — L299 Pruritus, unspecified: Secondary | ICD-10-CM | POA: Diagnosis not present

## 2016-03-21 DIAGNOSIS — B86 Scabies: Secondary | ICD-10-CM | POA: Diagnosis not present

## 2016-04-04 DIAGNOSIS — L300 Nummular dermatitis: Secondary | ICD-10-CM | POA: Diagnosis not present

## 2016-04-04 DIAGNOSIS — L97911 Non-pressure chronic ulcer of unspecified part of right lower leg limited to breakdown of skin: Secondary | ICD-10-CM | POA: Diagnosis not present

## 2016-04-18 DIAGNOSIS — L97911 Non-pressure chronic ulcer of unspecified part of right lower leg limited to breakdown of skin: Secondary | ICD-10-CM | POA: Diagnosis not present

## 2016-05-11 DIAGNOSIS — Z23 Encounter for immunization: Secondary | ICD-10-CM | POA: Diagnosis not present

## 2016-08-14 DIAGNOSIS — J0100 Acute maxillary sinusitis, unspecified: Secondary | ICD-10-CM | POA: Diagnosis not present

## 2016-10-14 ENCOUNTER — Encounter: Payer: Self-pay | Admitting: Vascular Surgery

## 2016-10-25 ENCOUNTER — Other Ambulatory Visit: Payer: Self-pay

## 2016-10-25 DIAGNOSIS — I739 Peripheral vascular disease, unspecified: Secondary | ICD-10-CM

## 2016-10-26 ENCOUNTER — Inpatient Hospital Stay (HOSPITAL_COMMUNITY): Admission: RE | Admit: 2016-10-26 | Payer: Medicare Other | Source: Ambulatory Visit

## 2016-10-26 ENCOUNTER — Ambulatory Visit: Payer: Medicare Other | Admitting: Vascular Surgery

## 2017-06-14 DIAGNOSIS — Z23 Encounter for immunization: Secondary | ICD-10-CM | POA: Diagnosis not present

## 2018-06-16 DIAGNOSIS — Z79899 Other long term (current) drug therapy: Secondary | ICD-10-CM | POA: Diagnosis not present

## 2018-06-16 DIAGNOSIS — F17210 Nicotine dependence, cigarettes, uncomplicated: Secondary | ICD-10-CM | POA: Diagnosis not present

## 2018-06-16 DIAGNOSIS — K5190 Ulcerative colitis, unspecified, without complications: Secondary | ICD-10-CM | POA: Diagnosis not present

## 2018-06-16 DIAGNOSIS — J45909 Unspecified asthma, uncomplicated: Secondary | ICD-10-CM | POA: Diagnosis not present

## 2018-06-16 DIAGNOSIS — K529 Noninfective gastroenteritis and colitis, unspecified: Secondary | ICD-10-CM | POA: Diagnosis not present

## 2018-06-25 DIAGNOSIS — Z1339 Encounter for screening examination for other mental health and behavioral disorders: Secondary | ICD-10-CM | POA: Diagnosis not present

## 2018-06-25 DIAGNOSIS — R110 Nausea: Secondary | ICD-10-CM | POA: Diagnosis not present

## 2018-06-25 DIAGNOSIS — K529 Noninfective gastroenteritis and colitis, unspecified: Secondary | ICD-10-CM | POA: Diagnosis not present

## 2018-06-25 DIAGNOSIS — Z1331 Encounter for screening for depression: Secondary | ICD-10-CM | POA: Diagnosis not present

## 2018-06-25 DIAGNOSIS — Z9181 History of falling: Secondary | ICD-10-CM | POA: Diagnosis not present

## 2018-06-25 DIAGNOSIS — Z23 Encounter for immunization: Secondary | ICD-10-CM | POA: Diagnosis not present

## 2018-07-11 DIAGNOSIS — K591 Functional diarrhea: Secondary | ICD-10-CM | POA: Diagnosis not present

## 2018-07-17 DIAGNOSIS — D128 Benign neoplasm of rectum: Secondary | ICD-10-CM | POA: Diagnosis not present

## 2018-07-17 DIAGNOSIS — F17210 Nicotine dependence, cigarettes, uncomplicated: Secondary | ICD-10-CM | POA: Diagnosis not present

## 2018-07-17 DIAGNOSIS — R935 Abnormal findings on diagnostic imaging of other abdominal regions, including retroperitoneum: Secondary | ICD-10-CM | POA: Diagnosis not present

## 2018-07-17 DIAGNOSIS — K648 Other hemorrhoids: Secondary | ICD-10-CM | POA: Diagnosis not present

## 2018-07-17 DIAGNOSIS — D122 Benign neoplasm of ascending colon: Secondary | ICD-10-CM | POA: Diagnosis not present

## 2018-07-17 DIAGNOSIS — R197 Diarrhea, unspecified: Secondary | ICD-10-CM | POA: Diagnosis not present

## 2018-07-17 DIAGNOSIS — Z79899 Other long term (current) drug therapy: Secondary | ICD-10-CM | POA: Diagnosis not present

## 2018-07-17 DIAGNOSIS — K621 Rectal polyp: Secondary | ICD-10-CM | POA: Diagnosis not present

## 2018-07-17 DIAGNOSIS — Z800 Family history of malignant neoplasm of digestive organs: Secondary | ICD-10-CM | POA: Diagnosis not present

## 2018-07-17 DIAGNOSIS — Z1211 Encounter for screening for malignant neoplasm of colon: Secondary | ICD-10-CM | POA: Diagnosis not present

## 2019-01-22 DIAGNOSIS — L853 Xerosis cutis: Secondary | ICD-10-CM | POA: Diagnosis not present

## 2019-01-22 DIAGNOSIS — R5383 Other fatigue: Secondary | ICD-10-CM | POA: Diagnosis not present

## 2019-01-22 DIAGNOSIS — Z79899 Other long term (current) drug therapy: Secondary | ICD-10-CM | POA: Diagnosis not present

## 2019-01-22 DIAGNOSIS — D692 Other nonthrombocytopenic purpura: Secondary | ICD-10-CM | POA: Diagnosis not present

## 2019-02-21 DIAGNOSIS — B351 Tinea unguium: Secondary | ICD-10-CM | POA: Diagnosis not present

## 2019-02-21 DIAGNOSIS — B353 Tinea pedis: Secondary | ICD-10-CM | POA: Diagnosis not present

## 2019-03-13 DIAGNOSIS — L97911 Non-pressure chronic ulcer of unspecified part of right lower leg limited to breakdown of skin: Secondary | ICD-10-CM | POA: Diagnosis not present

## 2019-03-13 DIAGNOSIS — L0390 Cellulitis, unspecified: Secondary | ICD-10-CM | POA: Diagnosis not present

## 2019-03-13 DIAGNOSIS — L97921 Non-pressure chronic ulcer of unspecified part of left lower leg limited to breakdown of skin: Secondary | ICD-10-CM | POA: Diagnosis not present

## 2019-03-20 DIAGNOSIS — L97911 Non-pressure chronic ulcer of unspecified part of right lower leg limited to breakdown of skin: Secondary | ICD-10-CM | POA: Diagnosis not present

## 2019-03-20 DIAGNOSIS — L0390 Cellulitis, unspecified: Secondary | ICD-10-CM | POA: Diagnosis not present

## 2019-03-27 DIAGNOSIS — L97911 Non-pressure chronic ulcer of unspecified part of right lower leg limited to breakdown of skin: Secondary | ICD-10-CM | POA: Diagnosis not present

## 2019-03-27 DIAGNOSIS — L0390 Cellulitis, unspecified: Secondary | ICD-10-CM | POA: Diagnosis not present

## 2019-03-27 DIAGNOSIS — L97921 Non-pressure chronic ulcer of unspecified part of left lower leg limited to breakdown of skin: Secondary | ICD-10-CM | POA: Diagnosis not present

## 2019-04-03 DIAGNOSIS — L97911 Non-pressure chronic ulcer of unspecified part of right lower leg limited to breakdown of skin: Secondary | ICD-10-CM | POA: Diagnosis not present

## 2019-04-03 DIAGNOSIS — L97921 Non-pressure chronic ulcer of unspecified part of left lower leg limited to breakdown of skin: Secondary | ICD-10-CM | POA: Diagnosis not present

## 2019-04-10 DIAGNOSIS — L570 Actinic keratosis: Secondary | ICD-10-CM | POA: Diagnosis not present

## 2019-04-10 DIAGNOSIS — C44729 Squamous cell carcinoma of skin of left lower limb, including hip: Secondary | ICD-10-CM | POA: Diagnosis not present

## 2019-04-10 DIAGNOSIS — L97921 Non-pressure chronic ulcer of unspecified part of left lower leg limited to breakdown of skin: Secondary | ICD-10-CM | POA: Diagnosis not present

## 2019-04-10 DIAGNOSIS — L97911 Non-pressure chronic ulcer of unspecified part of right lower leg limited to breakdown of skin: Secondary | ICD-10-CM | POA: Diagnosis not present

## 2019-04-18 DIAGNOSIS — L97921 Non-pressure chronic ulcer of unspecified part of left lower leg limited to breakdown of skin: Secondary | ICD-10-CM | POA: Diagnosis not present

## 2019-04-18 DIAGNOSIS — L97911 Non-pressure chronic ulcer of unspecified part of right lower leg limited to breakdown of skin: Secondary | ICD-10-CM | POA: Diagnosis not present

## 2019-04-22 DIAGNOSIS — L97911 Non-pressure chronic ulcer of unspecified part of right lower leg limited to breakdown of skin: Secondary | ICD-10-CM | POA: Diagnosis not present

## 2019-04-22 DIAGNOSIS — L97921 Non-pressure chronic ulcer of unspecified part of left lower leg limited to breakdown of skin: Secondary | ICD-10-CM | POA: Diagnosis not present

## 2019-05-08 DIAGNOSIS — L97921 Non-pressure chronic ulcer of unspecified part of left lower leg limited to breakdown of skin: Secondary | ICD-10-CM | POA: Diagnosis not present

## 2019-05-08 DIAGNOSIS — L97911 Non-pressure chronic ulcer of unspecified part of right lower leg limited to breakdown of skin: Secondary | ICD-10-CM | POA: Diagnosis not present

## 2019-05-08 DIAGNOSIS — L570 Actinic keratosis: Secondary | ICD-10-CM | POA: Diagnosis not present

## 2019-05-15 DIAGNOSIS — L570 Actinic keratosis: Secondary | ICD-10-CM | POA: Diagnosis not present

## 2019-05-15 DIAGNOSIS — L820 Inflamed seborrheic keratosis: Secondary | ICD-10-CM | POA: Diagnosis not present

## 2019-05-15 DIAGNOSIS — L97921 Non-pressure chronic ulcer of unspecified part of left lower leg limited to breakdown of skin: Secondary | ICD-10-CM | POA: Diagnosis not present

## 2019-05-15 DIAGNOSIS — L97911 Non-pressure chronic ulcer of unspecified part of right lower leg limited to breakdown of skin: Secondary | ICD-10-CM | POA: Diagnosis not present

## 2019-05-22 DIAGNOSIS — L97921 Non-pressure chronic ulcer of unspecified part of left lower leg limited to breakdown of skin: Secondary | ICD-10-CM | POA: Diagnosis not present

## 2019-05-22 DIAGNOSIS — L97911 Non-pressure chronic ulcer of unspecified part of right lower leg limited to breakdown of skin: Secondary | ICD-10-CM | POA: Diagnosis not present

## 2019-05-29 DIAGNOSIS — L578 Other skin changes due to chronic exposure to nonionizing radiation: Secondary | ICD-10-CM | POA: Diagnosis not present

## 2019-05-29 DIAGNOSIS — L97911 Non-pressure chronic ulcer of unspecified part of right lower leg limited to breakdown of skin: Secondary | ICD-10-CM | POA: Diagnosis not present

## 2019-05-29 DIAGNOSIS — L97921 Non-pressure chronic ulcer of unspecified part of left lower leg limited to breakdown of skin: Secondary | ICD-10-CM | POA: Diagnosis not present

## 2019-05-29 DIAGNOSIS — L570 Actinic keratosis: Secondary | ICD-10-CM | POA: Diagnosis not present

## 2019-06-05 DIAGNOSIS — L97911 Non-pressure chronic ulcer of unspecified part of right lower leg limited to breakdown of skin: Secondary | ICD-10-CM | POA: Diagnosis not present

## 2019-06-05 DIAGNOSIS — L578 Other skin changes due to chronic exposure to nonionizing radiation: Secondary | ICD-10-CM | POA: Diagnosis not present

## 2019-06-12 DIAGNOSIS — L570 Actinic keratosis: Secondary | ICD-10-CM | POA: Diagnosis not present

## 2019-06-12 DIAGNOSIS — L97911 Non-pressure chronic ulcer of unspecified part of right lower leg limited to breakdown of skin: Secondary | ICD-10-CM | POA: Diagnosis not present

## 2019-07-01 DIAGNOSIS — Z23 Encounter for immunization: Secondary | ICD-10-CM | POA: Diagnosis not present

## 2019-08-29 DIAGNOSIS — L97911 Non-pressure chronic ulcer of unspecified part of right lower leg limited to breakdown of skin: Secondary | ICD-10-CM | POA: Diagnosis not present

## 2019-09-05 DIAGNOSIS — L97911 Non-pressure chronic ulcer of unspecified part of right lower leg limited to breakdown of skin: Secondary | ICD-10-CM | POA: Diagnosis not present

## 2019-09-09 ENCOUNTER — Telehealth: Payer: Self-pay

## 2019-09-09 NOTE — Telephone Encounter (Signed)
Pt called requesting an appt for a non-healing area on R ankle. She stated she has been seeing dermatology for this for weeks. Pt has not been here for more than 2 years, a new pt appt has been scheduled for her.

## 2019-09-12 DIAGNOSIS — L97911 Non-pressure chronic ulcer of unspecified part of right lower leg limited to breakdown of skin: Secondary | ICD-10-CM | POA: Diagnosis not present

## 2019-09-12 DIAGNOSIS — L0390 Cellulitis, unspecified: Secondary | ICD-10-CM | POA: Diagnosis not present

## 2019-09-18 DIAGNOSIS — L97911 Non-pressure chronic ulcer of unspecified part of right lower leg limited to breakdown of skin: Secondary | ICD-10-CM | POA: Diagnosis not present

## 2019-09-25 DIAGNOSIS — L97911 Non-pressure chronic ulcer of unspecified part of right lower leg limited to breakdown of skin: Secondary | ICD-10-CM | POA: Diagnosis not present

## 2019-10-02 ENCOUNTER — Other Ambulatory Visit: Payer: Self-pay

## 2019-10-02 ENCOUNTER — Encounter: Payer: Self-pay | Admitting: Vascular Surgery

## 2019-10-02 ENCOUNTER — Ambulatory Visit (INDEPENDENT_AMBULATORY_CARE_PROVIDER_SITE_OTHER): Payer: Medicare Other | Admitting: Vascular Surgery

## 2019-10-02 ENCOUNTER — Ambulatory Visit (HOSPITAL_COMMUNITY)
Admission: RE | Admit: 2019-10-02 | Discharge: 2019-10-02 | Disposition: A | Discharge status: Home / Self Care | Payer: Medicare Other | Source: Ambulatory Visit | Attending: Vascular Surgery | Admitting: Vascular Surgery

## 2019-10-02 ENCOUNTER — Other Ambulatory Visit: Payer: Self-pay | Admitting: *Deleted

## 2019-10-02 VITALS — BP 176/75 | HR 78 | Temp 98.0°F | Resp 20 | Ht 66.0 in | Wt 116.0 lb

## 2019-10-02 DIAGNOSIS — L97909 Non-pressure chronic ulcer of unspecified part of unspecified lower leg with unspecified severity: Secondary | ICD-10-CM | POA: Diagnosis not present

## 2019-10-02 DIAGNOSIS — R0989 Other specified symptoms and signs involving the circulatory and respiratory systems: Secondary | ICD-10-CM | POA: Diagnosis not present

## 2019-10-02 DIAGNOSIS — I739 Peripheral vascular disease, unspecified: Secondary | ICD-10-CM | POA: Insufficient documentation

## 2019-10-02 DIAGNOSIS — I70299 Other atherosclerosis of native arteries of extremities, unspecified extremity: Secondary | ICD-10-CM | POA: Diagnosis not present

## 2019-10-02 DIAGNOSIS — F17210 Nicotine dependence, cigarettes, uncomplicated: Secondary | ICD-10-CM | POA: Diagnosis not present

## 2019-10-02 NOTE — Progress Notes (Signed)
REASON FOR CONSULT:    Nonhealing wound of right ankle.   ASSESSMENT & PLAN:   ATHEROSCLEROSIS WITH NONHEALING ULCER RIGHT LEG: Given that this patient has had this ulcer for 8 to 10 months according to the son and has evidence of significant infrainguinal arterial occlusive disease, I think this could become a limb threatening problem.  I have recommended that we proceed with arteriography to see what options we might have for revascularization.  Hopefully she would be a candidate for endovascular approach given that she is 51.  I spent more than 3 minutes with her discussing the importance of tobacco cessation.  We discussed its role in contributing to atherosclerosis and also how it affects the microcirculation with respect to vasospasm.  She will make an effort to cut back on cigarettes and will discuss options with her primary care physician also.  I have reviewed with the patient the indications for arteriography. In addition, I have reviewed the potential complications of arteriography including but not limited to: Bleeding, arterial injury, arterial thrombosis, dye action, renal insufficiency, or other unpredictable medical problems. I have explained to the patient that if we find disease amenable to angioplasty we could potentially address this at the same time. I have discussed the potential complications of angioplasty and stenting, including but not limited to: Bleeding, arterial thrombosis, arterial injury, dissection, or the need for surgical intervention.  This has been scheduled for Friday, 10/04/2019  RIGHT CAROTID BRUIT: She does have a right carotid bruit and next time she returns to the office she will need a carotid duplex scan which I can order after her arteriogram on Friday.   Deitra Mayo, MD Office: [phone]   HPI:   Tamara Johnson is a pleasant 84 y.o. female, who has previously undergone a left femoropopliteal bypass graft in 2010.  This graft is chronically  occluded.  She has had ulcers on both ankles but the ulcer on the right ankle according to the son has been present for 8 to 10 months.  Patient says it has been present for 4 months.  This was treated with an Unna boot but continues to persist on the right although the ulcer on the left is healed.  She does describe some calf claudication bilaterally and also rest pain of the right foot.  Her risk factors for peripheral vascular disease include hypertension, a family history of premature cardiovascular disease, and tobacco use.  She smokes a pack per day of cigarettes and has been smoking for over 40 years.  Past Medical History:  Diagnosis Date  . Cellulitis     Family History  Problem Relation Age of Onset  . Heart attack Father     SOCIAL HISTORY: Social History   Socioeconomic History  . Marital status: Married    Spouse name: Not on file  . Number of children: Not on file  . Years of education: Not on file  . Highest education level: Not on file  Occupational History  . Not on file  Tobacco Use  . Smoking status: Current Every Day Smoker    Packs/day: 1.00    Types: Cigarettes  . Smokeless tobacco: Never Used  Substance and Sexual Activity  . Alcohol use: No    Alcohol/week: 0.0 standard drinks  . Drug use: No  . Sexual activity: Not on file  Other Topics Concern  . Not on file  Social History Narrative  . Not on file   Social Determinants of Health  Financial Resource Strain:   . Difficulty of Paying Living Expenses: Not on file  Food Insecurity:   . Worried About Charity fundraiser in the Last Year: Not on file  . Ran Out of Food in the Last Year: Not on file  Transportation Needs:   . Lack of Transportation (Medical): Not on file  . Lack of Transportation (Non-Medical): Not on file  Physical Activity:   . Days of Exercise per Week: Not on file  . Minutes of Exercise per Session: Not on file  Stress:   . Feeling of Stress : Not on file  Social  Connections:   . Frequency of Communication with Friends and Family: Not on file  . Frequency of Social Gatherings with Friends and Family: Not on file  . Attends Religious Services: Not on file  . Active Member of Clubs or Organizations: Not on file  . Attends Archivist Meetings: Not on file  . Marital Status: Not on file  Intimate Partner Violence:   . Fear of Current or Ex-Partner: Not on file  . Emotionally Abused: Not on file  . Physically Abused: Not on file  . Sexually Abused: Not on file    Allergies  Allergen Reactions  . Asa [Aspirin]     Current Outpatient Medications  Medication Sig Dispense Refill  . fexofenadine (ALLEGRA) 60 MG tablet Take 60 mg by mouth 2 (two) times daily.    . Multiple Vitamins-Minerals (ICAPS AREDS 2) CAPS Take by mouth.     No current facility-administered medications for this visit.    REVIEW OF SYSTEMS:  [X]  denotes positive finding, [ ]  denotes negative finding Cardiac  Comments:  Chest pain or chest pressure:    Shortness of breath upon exertion:    Short of breath when lying flat:    Irregular heart rhythm:        Vascular    Pain in calf, thigh, or hip brought on by ambulation: x   Pain in feet at night that wakes you up from your sleep:  x   Blood clot in your veins:    Leg swelling:         Pulmonary    Oxygen at home:    Productive cough:     Wheezing:         Neurologic    Sudden weakness in arms or legs:     Sudden numbness in arms or legs:     Sudden onset of difficulty speaking or slurred speech:    Temporary loss of vision in one eye:     Problems with dizziness:         Gastrointestinal    Blood in stool:     Vomited blood:         Genitourinary    Burning when urinating:     Blood in urine:        Psychiatric    Major depression:         Hematologic    Bleeding problems:    Problems with blood clotting too easily:        Skin    Rashes or ulcers: x       Constitutional    Fever or  chills:     PHYSICAL EXAM:   Vitals:   10/02/19 1050  BP: (!) 176/75  Pulse: 78  Resp: 20  Temp: 98 F (36.7 C)  SpO2: 97%  Weight: 116 lb (52.6 kg)  Height: 5\' 6"  (1.676  m)    GENERAL: The patient is a well-nourished female, in no acute distress. The vital signs are documented above. CARDIAC: There is a regular rate and rhythm.  VASCULAR: She has a right carotid bruit. On the right side, which is the side of concern she has a palpable femoral pulse.  I cannot palpate pedal pulses. On the left side she has a palpable femoral pulse.  I cannot palpate pedal pulses. She has no significant lower extremity swelling. PULMONARY: There is good air exchange bilaterally without wheezing or rales. ABDOMEN: Soft and non-tender with normal pitched bowel sounds.  I do not palpate an abdominal aortic aneurysm. MUSCULOSKELETAL: There are no major deformities or cyanosis. NEUROLOGIC: No focal weakness or paresthesias are detected. SKIN: She has a small wound on her right lateral malleolus as documented below.    PSYCHIATRIC: The patient has a normal affect.  DATA:    ARTERIAL DOPPLER STUDY: I have independently interpreted her arterial Doppler study today.  On the right side, which is the side of concern, there are monophasic Doppler signals in the dorsalis pedis and posterior tibial position.  ABI is 46%.  Toe pressure is 48 mmHg.  On the left side there a monophasic dorsalis pedis and posterior tibial signals.  ABI is 49%.  Toe pressures 51 mmHg.  LABS: I do not see any recent labs.  We will have to check her renal function prior to her arteriogram.  Her GFR was normal back in 2010.

## 2019-10-02 NOTE — H&P (View-Only) (Signed)
REASON FOR CONSULT:    Nonhealing wound of right ankle.   ASSESSMENT & PLAN:   ATHEROSCLEROSIS WITH NONHEALING ULCER RIGHT LEG: Given that this patient has had this ulcer for 8 to 10 months according to the son and has evidence of significant infrainguinal arterial occlusive disease, I think this could become a limb threatening problem.  I have recommended that we proceed with arteriography to see what options we might have for revascularization.  Hopefully she would be a candidate for endovascular approach given that she is 11.  I spent more than 3 minutes with her discussing the importance of tobacco cessation.  We discussed its role in contributing to atherosclerosis and also how it affects the microcirculation with respect to vasospasm.  She will make an effort to cut back on cigarettes and will discuss options with her primary care physician also.  I have reviewed with the patient the indications for arteriography. In addition, I have reviewed the potential complications of arteriography including but not limited to: Bleeding, arterial injury, arterial thrombosis, dye action, renal insufficiency, or other unpredictable medical problems. I have explained to the patient that if we find disease amenable to angioplasty we could potentially address this at the same time. I have discussed the potential complications of angioplasty and stenting, including but not limited to: Bleeding, arterial thrombosis, arterial injury, dissection, or the need for surgical intervention.  This has been scheduled for Friday, 10/04/2019  RIGHT CAROTID BRUIT: She does have a right carotid bruit and next time she returns to the office she will need a carotid duplex scan which I can order after her arteriogram on Friday.   Deitra Mayo, MD Office: [phone]   HPI:   Tamara Johnson is a pleasant 84 y.o. female, who has previously undergone a left femoropopliteal bypass graft in 2010.  This graft is chronically  occluded.  She has had ulcers on both ankles but the ulcer on the right ankle according to the son has been present for 8 to 10 months.  Patient says it has been present for 4 months.  This was treated with an Unna boot but continues to persist on the right although the ulcer on the left is healed.  She does describe some calf claudication bilaterally and also rest pain of the right foot.  Her risk factors for peripheral vascular disease include hypertension, a family history of premature cardiovascular disease, and tobacco use.  She smokes a pack per day of cigarettes and has been smoking for over 40 years.  Past Medical History:  Diagnosis Date  . Cellulitis     Family History  Problem Relation Age of Onset  . Heart attack Father     SOCIAL HISTORY: Social History   Socioeconomic History  . Marital status: Married    Spouse name: Not on file  . Number of children: Not on file  . Years of education: Not on file  . Highest education level: Not on file  Occupational History  . Not on file  Tobacco Use  . Smoking status: Current Every Day Smoker    Packs/day: 1.00    Types: Cigarettes  . Smokeless tobacco: Never Used  Substance and Sexual Activity  . Alcohol use: No    Alcohol/week: 0.0 standard drinks  . Drug use: No  . Sexual activity: Not on file  Other Topics Concern  . Not on file  Social History Narrative  . Not on file   Social Determinants of Health  Financial Resource Strain:   . Difficulty of Paying Living Expenses: Not on file  Food Insecurity:   . Worried About Charity fundraiser in the Last Year: Not on file  . Ran Out of Food in the Last Year: Not on file  Transportation Needs:   . Lack of Transportation (Medical): Not on file  . Lack of Transportation (Non-Medical): Not on file  Physical Activity:   . Days of Exercise per Week: Not on file  . Minutes of Exercise per Session: Not on file  Stress:   . Feeling of Stress : Not on file  Social  Connections:   . Frequency of Communication with Friends and Family: Not on file  . Frequency of Social Gatherings with Friends and Family: Not on file  . Attends Religious Services: Not on file  . Active Member of Clubs or Organizations: Not on file  . Attends Archivist Meetings: Not on file  . Marital Status: Not on file  Intimate Partner Violence:   . Fear of Current or Ex-Partner: Not on file  . Emotionally Abused: Not on file  . Physically Abused: Not on file  . Sexually Abused: Not on file    Allergies  Allergen Reactions  . Asa [Aspirin]     Current Outpatient Medications  Medication Sig Dispense Refill  . fexofenadine (ALLEGRA) 60 MG tablet Take 60 mg by mouth 2 (two) times daily.    . Multiple Vitamins-Minerals (ICAPS AREDS 2) CAPS Take by mouth.     No current facility-administered medications for this visit.    REVIEW OF SYSTEMS:  [X]  denotes positive finding, [ ]  denotes negative finding Cardiac  Comments:  Chest pain or chest pressure:    Shortness of breath upon exertion:    Short of breath when lying flat:    Irregular heart rhythm:        Vascular    Pain in calf, thigh, or hip brought on by ambulation: x   Pain in feet at night that wakes you up from your sleep:  x   Blood clot in your veins:    Leg swelling:         Pulmonary    Oxygen at home:    Productive cough:     Wheezing:         Neurologic    Sudden weakness in arms or legs:     Sudden numbness in arms or legs:     Sudden onset of difficulty speaking or slurred speech:    Temporary loss of vision in one eye:     Problems with dizziness:         Gastrointestinal    Blood in stool:     Vomited blood:         Genitourinary    Burning when urinating:     Blood in urine:        Psychiatric    Major depression:         Hematologic    Bleeding problems:    Problems with blood clotting too easily:        Skin    Rashes or ulcers: x       Constitutional    Fever or  chills:     PHYSICAL EXAM:   Vitals:   10/02/19 1050  BP: (!) 176/75  Pulse: 78  Resp: 20  Temp: 98 F (36.7 C)  SpO2: 97%  Weight: 116 lb (52.6 kg)  Height: 5\' 6"  (1.676  m)    GENERAL: The patient is a well-nourished female, in no acute distress. The vital signs are documented above. CARDIAC: There is a regular rate and rhythm.  VASCULAR: She has a right carotid bruit. On the right side, which is the side of concern she has a palpable femoral pulse.  I cannot palpate pedal pulses. On the left side she has a palpable femoral pulse.  I cannot palpate pedal pulses. She has no significant lower extremity swelling. PULMONARY: There is good air exchange bilaterally without wheezing or rales. ABDOMEN: Soft and non-tender with normal pitched bowel sounds.  I do not palpate an abdominal aortic aneurysm. MUSCULOSKELETAL: There are no major deformities or cyanosis. NEUROLOGIC: No focal weakness or paresthesias are detected. SKIN: She has a small wound on her right lateral malleolus as documented below.    PSYCHIATRIC: The patient has a normal affect.  DATA:    ARTERIAL DOPPLER STUDY: I have independently interpreted her arterial Doppler study today.  On the right side, which is the side of concern, there are monophasic Doppler signals in the dorsalis pedis and posterior tibial position.  ABI is 46%.  Toe pressure is 48 mmHg.  On the left side there a monophasic dorsalis pedis and posterior tibial signals.  ABI is 49%.  Toe pressures 51 mmHg.  LABS: I do not see any recent labs.  We will have to check her renal function prior to her arteriogram.  Her GFR was normal back in 2010.

## 2019-10-03 ENCOUNTER — Other Ambulatory Visit (HOSPITAL_COMMUNITY)
Admission: RE | Admit: 2019-10-03 | Discharge: 2019-10-03 | Disposition: A | Discharge status: Home / Self Care | Payer: Medicare Other | Source: Ambulatory Visit | Attending: Vascular Surgery | Admitting: Vascular Surgery

## 2019-10-03 DIAGNOSIS — Z01812 Encounter for preprocedural laboratory examination: Secondary | ICD-10-CM | POA: Insufficient documentation

## 2019-10-03 DIAGNOSIS — Z20822 Contact with and (suspected) exposure to covid-19: Secondary | ICD-10-CM | POA: Diagnosis not present

## 2019-10-03 LAB — SARS CORONAVIRUS 2 (TAT 6-24 HRS): SARS Coronavirus 2: NEGATIVE

## 2019-10-04 ENCOUNTER — Ambulatory Visit (HOSPITAL_COMMUNITY)
Admission: RE | Admit: 2019-10-04 | Discharge: 2019-10-04 | Disposition: A | Discharge status: Home / Self Care | Payer: Medicare Other | Attending: Vascular Surgery | Admitting: Vascular Surgery

## 2019-10-04 ENCOUNTER — Encounter (HOSPITAL_COMMUNITY)
Admission: RE | Disposition: A | Discharge status: Home / Self Care | Payer: Self-pay | Source: Home / Self Care | Attending: Vascular Surgery

## 2019-10-04 ENCOUNTER — Other Ambulatory Visit: Payer: Self-pay

## 2019-10-04 ENCOUNTER — Encounter: Payer: Self-pay | Admitting: Vascular Surgery

## 2019-10-04 DIAGNOSIS — F17210 Nicotine dependence, cigarettes, uncomplicated: Secondary | ICD-10-CM | POA: Insufficient documentation

## 2019-10-04 DIAGNOSIS — I10 Essential (primary) hypertension: Secondary | ICD-10-CM | POA: Insufficient documentation

## 2019-10-04 DIAGNOSIS — I70238 Atherosclerosis of native arteries of right leg with ulceration of other part of lower right leg: Secondary | ICD-10-CM | POA: Diagnosis not present

## 2019-10-04 DIAGNOSIS — Z8249 Family history of ischemic heart disease and other diseases of the circulatory system: Secondary | ICD-10-CM | POA: Insufficient documentation

## 2019-10-04 DIAGNOSIS — Z886 Allergy status to analgesic agent status: Secondary | ICD-10-CM | POA: Diagnosis not present

## 2019-10-04 DIAGNOSIS — I70221 Atherosclerosis of native arteries of extremities with rest pain, right leg: Secondary | ICD-10-CM

## 2019-10-04 DIAGNOSIS — L97219 Non-pressure chronic ulcer of right calf with unspecified severity: Secondary | ICD-10-CM | POA: Diagnosis not present

## 2019-10-04 HISTORY — PX: ABDOMINAL AORTOGRAM W/LOWER EXTREMITY: CATH118223

## 2019-10-04 LAB — POCT I-STAT, CHEM 8
BUN: 21 mg/dL (ref 8–23)
Calcium, Ion: 1.15 mmol/L (ref 1.15–1.40)
Chloride: 105 mmol/L (ref 98–111)
Creatinine, Ser: 0.9 mg/dL (ref 0.44–1.00)
Glucose, Bld: 105 mg/dL — ABNORMAL HIGH (ref 70–99)
HCT: 39 % (ref 36.0–46.0)
Hemoglobin: 13.3 g/dL (ref 12.0–15.0)
Potassium: 3.9 mmol/L (ref 3.5–5.1)
Sodium: 141 mmol/L (ref 135–145)
TCO2: 30 mmol/L (ref 22–32)

## 2019-10-04 SURGERY — ABDOMINAL AORTOGRAM W/LOWER EXTREMITY
Anesthesia: LOCAL | Laterality: Bilateral

## 2019-10-04 MED ORDER — LIDOCAINE HCL (PF) 1 % IJ SOLN
INTRAMUSCULAR | Status: AC
  Filled 2019-10-04: qty 30

## 2019-10-04 MED ORDER — SODIUM CHLORIDE 0.9 % IV SOLN: INTRAVENOUS | Status: DC

## 2019-10-04 MED ORDER — MIDAZOLAM HCL 2 MG/2ML IJ SOLN
INTRAMUSCULAR | Status: AC
  Filled 2019-10-04: qty 2

## 2019-10-04 MED ORDER — HYDRALAZINE HCL 20 MG/ML IJ SOLN: 5.0000 mg | INTRAMUSCULAR | Status: DC | PRN

## 2019-10-04 MED ORDER — LABETALOL HCL 5 MG/ML IV SOLN: 10.0000 mg | INTRAVENOUS | Status: DC | PRN

## 2019-10-04 MED ORDER — SODIUM CHLORIDE 0.9 % IV SOLN: 250.0000 mL | INTRAVENOUS | Status: DC | PRN

## 2019-10-04 MED ORDER — HEPARIN (PORCINE) IN NACL 1000-0.9 UT/500ML-% IV SOLN
INTRAVENOUS | Status: AC
  Filled 2019-10-04: qty 500

## 2019-10-04 MED ORDER — MIDAZOLAM HCL 2 MG/2ML IJ SOLN
INTRAMUSCULAR | Status: DC | PRN
  Administered 2019-10-04: 0.5 mg via INTRAVENOUS

## 2019-10-04 MED ORDER — SODIUM CHLORIDE 0.9% FLUSH: 3.0000 mL | INTRAVENOUS | Status: DC | PRN

## 2019-10-04 MED ORDER — LIDOCAINE HCL (PF) 1 % IJ SOLN
INTRAMUSCULAR | Status: DC | PRN
  Administered 2019-10-04: 15 mL via INTRADERMAL

## 2019-10-04 MED ORDER — SODIUM CHLORIDE 0.9 % WEIGHT BASED INFUSION: 1.0000 mL/kg/h | INTRAVENOUS | Status: DC

## 2019-10-04 MED ORDER — ONDANSETRON HCL 4 MG/2ML IJ SOLN: 4.0000 mg | Freq: Four times a day (QID) | INTRAMUSCULAR | Status: DC | PRN

## 2019-10-04 MED ORDER — SODIUM CHLORIDE 0.9% FLUSH: 3.0000 mL | Freq: Two times a day (BID) | INTRAVENOUS | Status: DC

## 2019-10-04 MED ORDER — HEPARIN (PORCINE) IN NACL 1000-0.9 UT/500ML-% IV SOLN
INTRAVENOUS | Status: DC | PRN
  Administered 2019-10-04 (×2): 500 mL

## 2019-10-04 MED ORDER — FENTANYL CITRATE (PF) 100 MCG/2ML IJ SOLN
INTRAMUSCULAR | Status: AC
  Filled 2019-10-04: qty 2

## 2019-10-04 MED ORDER — ACETAMINOPHEN 325 MG PO TABS: 650.0000 mg | ORAL_TABLET | ORAL | Status: DC | PRN

## 2019-10-04 MED ORDER — FENTANYL CITRATE (PF) 100 MCG/2ML IJ SOLN
INTRAMUSCULAR | Status: DC | PRN
  Administered 2019-10-04: 25 ug via INTRAVENOUS

## 2019-10-04 MED ORDER — IODIXANOL 320 MG/ML IV SOLN
INTRAVENOUS | Status: DC | PRN
  Administered 2019-10-04: 105 mL via INTRA_ARTERIAL

## 2019-10-04 SURGICAL SUPPLY — 12 items
CATH ANGIO 5F PIGTAIL 65CM (CATHETERS) ×2 IMPLANT
CATH CROSS OVER TEMPO 5F (CATHETERS) ×2 IMPLANT
CATH STRAIGHT 5FR 65CM (CATHETERS) ×2 IMPLANT
CLOSURE MYNX CONTROL 5F (Vascular Products) ×2 IMPLANT
KIT MICROPUNCTURE NIT STIFF (SHEATH) ×2 IMPLANT
KIT PV (KITS) ×2 IMPLANT
SHEATH PINNACLE 5F 10CM (SHEATH) ×2 IMPLANT
SHEATH PROBE COVER 6X72 (BAG) ×2 IMPLANT
SYR MEDRAD MARK V 150ML (SYRINGE) ×2 IMPLANT
TRANSDUCER W/STOPCOCK (MISCELLANEOUS) ×2 IMPLANT
TRAY PV CATH (CUSTOM PROCEDURE TRAY) ×2 IMPLANT
WIRE BENTSON .035X145CM (WIRE) ×2 IMPLANT

## 2019-10-04 NOTE — Discharge Instructions (Signed)
Femoral Site Care This sheet gives you information about how to care for yourself after your procedure. Your health care provider may also give you more specific instructions. If you have problems or questions, contact your health care provider. What can I expect after the procedure? After the procedure, it is common to have:  Bruising that usually fades within 1-2 weeks.  Tenderness at the site. Follow these instructions at home: Wound care  Follow instructions from your health care provider about how to take care of your insertion site. Make sure you: ? Wash your hands with soap and water before you change your bandage (dressing). If soap and water are not available, use hand sanitizer. ? Change your dressing as told by your health care provider. ? Leave stitches (sutures), skin glue, or adhesive strips in place. These skin closures may need to stay in place for 2 weeks or longer. If adhesive strip edges start to loosen and curl up, you may trim the loose edges. Do not remove adhesive strips completely unless your health care provider tells you to do that.  Do not take baths, swim, or use a hot tub until your health care provider approves.  You may shower 24-48 hours after the procedure or as told by your health care provider. ? Gently wash the site with plain soap and water. ? Pat the area dry with a clean towel. ? Do not rub the site. This may cause bleeding.  Do not apply powder or lotion to the site. Keep the site clean and dry.  Check your femoral site every day for signs of infection. Check for: ? Redness, swelling, or pain. ? Fluid or blood. ? Warmth. ? Pus or a bad smell. Activity  For the first 2-3 days after your procedure, or as long as directed: ? Avoid climbing stairs as much as possible. ? Do not squat.  Do not lift anything that is heavier than 10 lb (4.5 kg), or the limit that you are told, until your health care provider says that it is safe.  Rest as  directed. ? Avoid sitting for a long time without moving. Get up to take short walks every 1-2 hours.  Do not drive for 24 hours if you were given a medicine to help you relax (sedative). General instructions  Take over-the-counter and prescription medicines only as told by your health care provider.  Keep all follow-up visits as told by your health care provider. This is important. Contact a health care provider if you have:  A fever or chills.  You have redness, swelling, or pain around your insertion site. Get help right away if:  The catheter insertion area swells very fast.  You pass out.  You suddenly start to sweat or your skin gets clammy.  The catheter insertion area is bleeding, and the bleeding does not stop when you hold steady pressure on the area.  The area near or just beyond the catheter insertion site becomes pale, cool, tingly, or numb. These symptoms may represent a serious problem that is an emergency. Do not wait to see if the symptoms will go away. Get medical help right away. Call your local emergency services (911 in the [place] Do not drive yourself to the hospital. Summary  After the procedure, it is common to have bruising that usually fades within 1-2 weeks.  Check your femoral site every day for signs of infection.  Do not lift anything that is heavier than 10 lb (4.5 kg), or the   limit that you are told, until your health care provider says that it is safe. This information is not intended to replace advice given to you by your health care provider. Make sure you discuss any questions you have with your health care provider. Document Revised: 08/07/2017 Document Reviewed: 08/07/2017 Elsevier Patient Education  2020 [employer]  

## 2019-10-04 NOTE — Op Note (Signed)
PATIENT: Tamara Johnson      MRN: [id number] DOB: 1935/11/27    DATE OF PROCEDURE: 10/04/2019  INDICATIONS:    Tamara Johnson is a 84 y.o. female who presents with rest pain of the right foot and also a wound she has had on her lateral malleolus for some time. She presents for arteriography.  PROCEDURE:    1. Conscious sedation 2. Ultrasound-guided access to the left common femoral artery 3. Aortogram with bilateral iliac arteriogram 4. Selective catheterization of the right external iliac artery with right lower extremity runoff 5. Retrograde left femoral arteriogram 6. Mynx closure of left common femoral artery  SURGEON: Judeth Cornfield. Scot Dock, MD, FACS  ANESTHESIA: Local with sedation  EBL: Minimal  TECHNIQUE: The patient was brought to the peripheral vascular lab and was sedated. The period of conscious sedation was 32 minutes.  During that time period, I was present face-to-face 100% of the time.  The patient was administered half a milligram of Versed and 25 mcg of fentanyl. The patient's heart rate, blood pressure, and oxygen saturation were monitored by the nurse continuously during the procedure.  Both groins were prepped and draped in the usual sterile fashion.  Under ultrasound guidance, after the skin was anesthetized, I cannulated the left common femoral artery with a micropuncture needle and a micropuncture sheath was introduced over a wire.  This was exchanged for a 5 [place] sheath over a Bentson wire.  By ultrasound the femoral artery was patent. A real-time image was obtained and sent to the server.  A pigtail catheter was positioned at the L1 vertebral body and flush aortogram obtained. The catheter was in position above the aortic bifurcation and oblique iliac projections were obtained. I then exchanged the pigtail catheter for a crossover catheter which was positioned into the right common iliac artery. I advanced the wire into the external iliac artery and exchanged  the crossover catheter for a straight catheter. Selective right external iliac arteriogram was obtained with right lower extremity runoff. I then remove this catheter over a sheath.  A retrograde left femoral arteriogram was then obtained with left lower extremity runoff.  At the completion of the procedure the 5 French sheath was prepped and then the minx device positioned through the sheath and then deployed without difficulty with good hemostasis.  FINDINGS:   1. There are single renal arteries bilaterally with no significant renal artery stenosis identified. The infrarenal aorta has mild diffuse disease with some eccentric plaque in the mid aorta but this does not appear to produce a significant stenosis. 2. On the right side, which is the symptomatic side, the common iliac artery and external iliac artery are patent. The hypogastric artery is patent.  The common femoral artery is patent with 1 focal area of stenosis. The deep femoral artery has a proximal stenosis but is patent below that. The superficial femoral artery is occluded at its origin with reconstitution of the above-knee popliteal artery. There is three-vessel runoff on the right via the anterior tibial, posterior tibial, and peroneal arteries. The posterior tibial is a dominant runoff. 3. On the left side the common iliac external iliac and hypogastric arteries are patent. The common femoral and deep femoral artery are patent. The superficial femoral artery is occluded at its origin with reconstitution of the popliteal artery at the level of the knee. There is two-vessel runoff on the left via the posterior tibial artery and peroneal artery.  CLINICAL NOTE: This patient was not a candidate  for an endovascular approach. Given that she is 34 I will see her back in the office to see if the wound is improving. If it is not I think we would have to consider her for right Pham above-knee or below-knee popliteal artery bypass. I will vein map  her when she comes in and also get a carotid duplex that she had a right carotid bruit.   Deitra Mayo, MD, FACS [facility]  DATE OF DICTATION:   10/04/2019

## 2019-10-04 NOTE — Progress Notes (Signed)
Ambulated in hallway and to the bathroom tol well  No bleeding noted before or after ambulation.

## 2019-10-04 NOTE — Interval H&P Note (Signed)
History and Physical Interval Note:  10/04/2019 10:35 AM  Tamara Johnson  has presented today for surgery, with the diagnosis of claudication.  The various methods of treatment have been discussed with the patient and family. After consideration of risks, benefits and other options for treatment, the patient has consented to  Procedure(s): ABDOMINAL AORTOGRAM W/LOWER EXTREMITY (N/A) as a surgical intervention.  The patient's history has been reviewed, patient examined, no change in status, stable for surgery.  I have reviewed the patient's chart and labs.  Questions were answered to the patient's satisfaction.     Deitra Mayo

## 2019-10-17 ENCOUNTER — Other Ambulatory Visit: Payer: Self-pay | Admitting: *Deleted

## 2019-10-17 DIAGNOSIS — R0989 Other specified symptoms and signs involving the circulatory and respiratory systems: Secondary | ICD-10-CM

## 2019-10-17 DIAGNOSIS — I70299 Other atherosclerosis of native arteries of extremities, unspecified extremity: Secondary | ICD-10-CM

## 2019-10-17 DIAGNOSIS — I739 Peripheral vascular disease, unspecified: Secondary | ICD-10-CM

## 2019-10-17 DIAGNOSIS — L97909 Non-pressure chronic ulcer of unspecified part of unspecified lower leg with unspecified severity: Secondary | ICD-10-CM

## 2019-10-18 ENCOUNTER — Ambulatory Visit (HOSPITAL_COMMUNITY)
Admission: RE | Admit: 2019-10-18 | Discharge: 2019-10-18 | Disposition: A | Discharge status: Home / Self Care | Payer: Medicare Other | Source: Ambulatory Visit | Attending: Vascular Surgery | Admitting: Vascular Surgery

## 2019-10-18 ENCOUNTER — Ambulatory Visit (INDEPENDENT_AMBULATORY_CARE_PROVIDER_SITE_OTHER)
Admission: RE | Admit: 2019-10-18 | Discharge: 2019-10-18 | Disposition: A | Discharge status: Home / Self Care | Payer: Medicare Other | Source: Ambulatory Visit | Attending: Vascular Surgery | Admitting: Vascular Surgery

## 2019-10-18 ENCOUNTER — Other Ambulatory Visit: Payer: Self-pay

## 2019-10-18 DIAGNOSIS — I739 Peripheral vascular disease, unspecified: Secondary | ICD-10-CM | POA: Insufficient documentation

## 2019-10-18 DIAGNOSIS — I70299 Other atherosclerosis of native arteries of extremities, unspecified extremity: Secondary | ICD-10-CM

## 2019-10-18 DIAGNOSIS — L97909 Non-pressure chronic ulcer of unspecified part of unspecified lower leg with unspecified severity: Secondary | ICD-10-CM | POA: Insufficient documentation

## 2019-10-18 DIAGNOSIS — R0989 Other specified symptoms and signs involving the circulatory and respiratory systems: Secondary | ICD-10-CM

## 2019-10-22 ENCOUNTER — Telehealth (HOSPITAL_COMMUNITY): Payer: Self-pay

## 2019-10-22 NOTE — Telephone Encounter (Signed)

## 2019-10-23 ENCOUNTER — Other Ambulatory Visit: Payer: Self-pay

## 2019-10-23 ENCOUNTER — Encounter: Payer: Self-pay | Admitting: Vascular Surgery

## 2019-10-23 ENCOUNTER — Ambulatory Visit (INDEPENDENT_AMBULATORY_CARE_PROVIDER_SITE_OTHER): Payer: Self-pay | Admitting: Vascular Surgery

## 2019-10-23 VITALS — BP 142/79 | HR 78 | Temp 97.7°F | Resp 20 | Ht 66.0 in | Wt 114.0 lb

## 2019-10-23 DIAGNOSIS — I70299 Other atherosclerosis of native arteries of extremities, unspecified extremity: Secondary | ICD-10-CM

## 2019-10-23 DIAGNOSIS — L97909 Non-pressure chronic ulcer of unspecified part of unspecified lower leg with unspecified severity: Secondary | ICD-10-CM

## 2019-10-23 MED ORDER — ATORVASTATIN CALCIUM 10 MG PO TABS: 10.0000 mg | ORAL_TABLET | Freq: Every day | ORAL | 11 refills | Status: DC

## 2019-10-23 MED ORDER — ASPIRIN EC 81 MG PO TBEC: 81.0000 mg | DELAYED_RELEASE_TABLET | Freq: Every day | ORAL | 2 refills | Status: DC

## 2019-10-23 NOTE — Progress Notes (Signed)
Patient name: Tamara Johnson MRN: [id number] DOB: [birth year] Sex: female  REASON FOR VISIT:   Critical limb ischemia.  HPI:   Tamara Johnson is a pleasant 84 y.o. female who I saw in consultation on 10/02/2019 with peripheral vascular disease and a nonhealing wound of the right leg.  The patient had developed an ulcer on her right leg approximately 8 to 10 months ago and had evidence of significant infrainguinal arterial occlusive disease on exam.  I felt that this was a limb threatening problem.  We discussed the importance of tobacco cessation.  I set her up for an arteriogram hoping that we would find something to do from a endovascular standpoint given her age and somewhat debilitated state.  In addition at the time of that visit she had a right carotid bruit.  Since her arteriogram she has no specific complaints.  She states that the wound on her right ankle has improved.  She denies fever or chills.  She denies any history of stroke, TIAs, expressive or receptive aphasia, or amaurosis fugax.  Past Medical History:  Diagnosis Date  . Cellulitis     Family History  Problem Relation Age of Onset  . Heart attack Father     SOCIAL HISTORY: Social History   Tobacco Use  . Smoking status: Current Every Day Smoker    Packs/day: 1.00    Types: Cigarettes  . Smokeless tobacco: Never Used  Substance Use Topics  . Alcohol use: No    Alcohol/week: 0.0 standard drinks    Allergies  Allergen Reactions  . Asa [Aspirin]     Stomach pain    Current Outpatient Medications  Medication Sig Dispense Refill  . acetaminophen (TYLENOL) 500 MG tablet Take 1,000 mg by mouth 2 (two) times daily as needed for moderate pain.    . fexofenadine (ALLEGRA) 180 MG tablet Take 180 mg by mouth daily.    . Multiple Vitamins-Minerals (ICAPS AREDS 2) CAPS Take 1 tablet by mouth in the morning and at bedtime.     . mupirocin cream (BACTROBAN) 2 % Apply 1 application topically every Wednesday.     Marland Kitchen  aspirin EC 81 MG tablet Take 1 tablet (81 mg total) by mouth daily. 150 tablet 2  . atorvastatin (LIPITOR) 10 MG tablet Take 1 tablet (10 mg total) by mouth daily. 30 tablet 11   No current facility-administered medications for this visit.    REVIEW OF SYSTEMS:  [X]  denotes positive finding, [ ]  denotes negative finding Cardiac  Comments:  Chest pain or chest pressure:    Shortness of breath upon exertion:    Short of breath when lying flat:    Irregular heart rhythm:        Vascular    Pain in calf, thigh, or hip brought on by ambulation:    Pain in feet at night that wakes you up from your sleep:     Blood clot in your veins:    Leg swelling:         Pulmonary    Oxygen at home:    Productive cough:     Wheezing:         Neurologic    Sudden weakness in arms or legs:     Sudden numbness in arms or legs:     Sudden onset of difficulty speaking or slurred speech:    Temporary loss of vision in one eye:     Problems with dizziness:  Gastrointestinal    Blood in stool:     Vomited blood:         Genitourinary    Burning when urinating:     Blood in urine:        Psychiatric    Major depression:         Hematologic    Bleeding problems:    Problems with blood clotting too easily:        Skin    Rashes or ulcers:        Constitutional    Fever or chills:     PHYSICAL EXAM:   Vitals:   10/23/19 1032 10/23/19 1035  BP: 129/72 (!) 142/79  Pulse: 78   Resp: 20   Temp: 97.7 F (36.5 C)   SpO2: 97%   Weight: 114 lb (51.7 kg)   Height: 5\' 6"  (1.676 m)     GENERAL: The patient is a well-nourished female, in no acute distress. The vital signs are documented above. CARDIAC: There is a regular rate and rhythm.  VASCULAR: She has a right carotid bruit. I cannot palpate pedal pulses. PULMONARY: There is good air exchange bilaterally without wheezing or rales. ABDOMEN: Soft and non-tender with normal pitched bowel sounds.  MUSCULOSKELETAL: There are no  major deformities or cyanosis. NEUROLOGIC: No focal weakness or paresthesias are detected. SKIN: The wound on her right lateral malleolus has improved.    PSYCHIATRIC: The patient has a normal affect.  DATA:    CAROTID DUPLEX: I have reviewed the carotid duplex scan that was done on 10/18/2019.  On the right side, which was the side with the bruit, there is a 60 to 79% right carotid stenosis.  The right vertebral artery is patent with antegrade flow.  On the left side there is a less than 39% stenosis.  The left vertebral artery is patent with antegrade flow.  BILATERAL SAPHENOUS VEIN MAP: I have reviewed the saphenous vein map that was done on 10/18/2019.  She did not have usable saphenous vein in either lower extremity.  MEDICAL ISSUES:   PERIPHERAL VASCULAR DISEASE: The patient has a superficial femoral artery occlusion on the right.  Her toe pressure is 48 mmHg which is borderline.  However the wound has slightly improved.  Given that she is not a candidate for endovascular approach and her only option would be a prosthetic bypass to the below-knee popliteal artery I think we will only consider this if the wound regresses.  We have again discussed the importance of tobacco cessation.  I encouraged her to stay as active as possible.  If the wound regresses then I think she would be a candidate for a right femoral to below-knee popliteal artery bypass with PTFE.  Her vein map shows that she did not have adequate vein for autogenous conduit.  RIGHT CAROTID BRUIT: She has an asymptomatic 60 to 79% right carotid stenosis.  I have ordered a follow-up carotid duplex scan in 6 months.  She understands we would not consider right carotid endarterectomy was the stenosis progressed to greater than 80% or she developed new right hemispheric symptoms.  I have sent a prescription to her pharmacy for 81 mg of enteric-coated aspirin daily and also a low-dose Lipitor (10 mg p.o. daily).  Deitra Mayo  [facility] [phone]

## 2019-10-24 ENCOUNTER — Other Ambulatory Visit: Payer: Self-pay | Admitting: *Deleted

## 2019-10-24 DIAGNOSIS — I739 Peripheral vascular disease, unspecified: Secondary | ICD-10-CM

## 2019-10-24 DIAGNOSIS — Z23 Encounter for immunization: Secondary | ICD-10-CM | POA: Diagnosis not present

## 2019-10-24 DIAGNOSIS — R0989 Other specified symptoms and signs involving the circulatory and respiratory systems: Secondary | ICD-10-CM

## 2019-11-21 DIAGNOSIS — Z23 Encounter for immunization: Secondary | ICD-10-CM | POA: Diagnosis not present

## 2020-04-21 ENCOUNTER — Telehealth: Payer: Self-pay | Admitting: *Deleted

## 2020-04-21 NOTE — Telephone Encounter (Signed)
Patient son called requesting an appt for patient due to new wound developing on her left ankle he describes it as quarter size red area with pea size open area. He states patient does not have any fever at this time. Spoke with Dr Carlis Abbott ok to schedule patient on PA schedule with ABI prior to being seen . Patient son give appt time to be seen 04/24/20 10:00 for ABI and 1045 for office visit.

## 2020-04-24 ENCOUNTER — Other Ambulatory Visit: Payer: Self-pay

## 2020-04-24 ENCOUNTER — Ambulatory Visit (HOSPITAL_COMMUNITY)
Admission: RE | Admit: 2020-04-24 | Discharge: 2020-04-24 | Disposition: A | Discharge status: Home / Self Care | Payer: Medicare Other | Source: Ambulatory Visit | Attending: Vascular Surgery | Admitting: Vascular Surgery

## 2020-04-24 ENCOUNTER — Ambulatory Visit (INDEPENDENT_AMBULATORY_CARE_PROVIDER_SITE_OTHER): Payer: Medicare Other | Admitting: Physician Assistant

## 2020-04-24 VITALS — BP 136/76 | HR 69 | Temp 97.9°F | Resp 20 | Ht 66.0 in | Wt 113.0 lb

## 2020-04-24 DIAGNOSIS — I739 Peripheral vascular disease, unspecified: Secondary | ICD-10-CM

## 2020-04-24 DIAGNOSIS — L97321 Non-pressure chronic ulcer of left ankle limited to breakdown of skin: Secondary | ICD-10-CM

## 2020-04-24 MED ORDER — ATORVASTATIN CALCIUM 10 MG PO TABS: 10.0000 mg | ORAL_TABLET | Freq: Every day | ORAL | 6 refills | Status: DC

## 2020-04-24 MED ORDER — MUPIROCIN 2 % EX OINT: 1.0000 "application " | TOPICAL_OINTMENT | Freq: Two times a day (BID) | CUTANEOUS | 0 refills | Status: DC

## 2020-04-24 MED ORDER — ASPIRIN EC 81 MG PO TBEC: 81.0000 mg | DELAYED_RELEASE_TABLET | Freq: Every day | ORAL | 2 refills | Status: DC

## 2020-04-24 NOTE — Addendum Note (Signed)
Addended by: Barbie Banner on: 04/24/2020 02:40 PM   Modules accepted: Orders

## 2020-04-24 NOTE — Progress Notes (Signed)
Office Note     CC:  follow up Requesting Provider:  Bonnita Nasuti, MD  HPI: Tamara Johnson is a 84 y.o. (1936/07/09) female who presents with new left lower extremity wound.  The patient has a history of peripheral arterial disease and underwent arteriogram in February of this year.  This showed chronic occlusion of both superficial femoral arteries.  She was deemed not to be a candidate for endovascular intervention.  Follow-up vein mapping and reevaluation in the office by Dr. Scot Dock in March of this year.  Her right medial malleolus wound at that time showed improvement and she was advised she would need lower extremity bypass with PTFE graft should she regress.  Her daughter-in-law accompanies her today.  The patient complains of pain of her left medial malleolus.  Approxi-2 weeks ago she bumped this area against a hard surface and the wound has been more painful.  She denies fever or chills.  She denies claudication or rest pain.  She was seen by dermatologist for other skin issues and Mupirocin ointment was recommended for the ulcer.  She has been applying this daily.  She continues to decrease her smoking and is down to approximate 4 to 5 cigarettes daily.  She is compliant with her statin and aspirin.  She walks approximately half mile a day with her dogs.  Past Medical History:  Diagnosis Date  . Cellulitis     Past Surgical History:  Procedure Laterality Date  . ABDOMINAL AORTOGRAM W/LOWER EXTREMITY Bilateral 10/04/2019   Procedure: ABDOMINAL AORTOGRAM W/LOWER EXTREMITY;  Surgeon: Angelia Mould, MD;  Location: [facility];  Service: Cardiovascular;  Laterality: Bilateral;  . PR VEIN BYPASS GRAFT,AORTO-FEM-POP      Social History   Socioeconomic History  . Marital status: Married    Spouse name: Not on file  . Number of children: Not on file  . Years of education: Not on file  . Highest education level: Not on file  Occupational History  . Not on file    Tobacco Use  . Smoking status: Current Every Day Smoker    Packs/day: 1.00    Types: Cigarettes  . Smokeless tobacco: Never Used  Vaping Use  . Vaping Use: Never used  Substance and Sexual Activity  . Alcohol use: No    Alcohol/week: 0.0 standard drinks  . Drug use: No  . Sexual activity: Not on file  Other Topics Concern  . Not on file  Social History Narrative  . Not on file   Social Determinants of Health   Financial Resource Strain:   . Difficulty of Paying Living Expenses: Not on file  Food Insecurity:   . Worried About Charity fundraiser in the Last Year: Not on file  . Ran Out of Food in the Last Year: Not on file  Transportation Needs:   . Lack of Transportation (Medical): Not on file  . Lack of Transportation (Non-Medical): Not on file  Physical Activity:   . Days of Exercise per Week: Not on file  . Minutes of Exercise per Session: Not on file  Stress:   . Feeling of Stress : Not on file  Social Connections:   . Frequency of Communication with Friends and Family: Not on file  . Frequency of Social Gatherings with Friends and Family: Not on file  . Attends Religious Services: Not on file  . Active Member of Clubs or Organizations: Not on file  . Attends Archivist Meetings: Not  on file  . Marital Status: Not on file  Intimate Partner Violence:   . Fear of Current or Ex-Partner: Not on file  . Emotionally Abused: Not on file  . Physically Abused: Not on file  . Sexually Abused: Not on file   Family History  Problem Relation Age of Onset  . Heart attack Father     Current Outpatient Medications  Medication Sig Dispense Refill  . acetaminophen (TYLENOL) 500 MG tablet Take 1,000 mg by mouth 2 (two) times daily as needed for moderate pain.    Marland Kitchen aspirin EC 81 MG tablet Take 1 tablet (81 mg total) by mouth daily. 150 tablet 2  . atorvastatin (LIPITOR) 10 MG tablet Take 1 tablet (10 mg total) by mouth daily. 30 tablet 11  . fexofenadine (ALLEGRA)  180 MG tablet Take 180 mg by mouth daily.    . Multiple Vitamins-Minerals (ICAPS AREDS 2) CAPS Take 1 tablet by mouth in the morning and at bedtime.     . mupirocin cream (BACTROBAN) 2 % Apply 1 application topically every Wednesday.      No current facility-administered medications for this visit.    Allergies  Allergen Reactions  . Asa [Aspirin]     Stomach pain     REVIEW OF SYSTEMS:   [X]  denotes positive finding, [ ]  denotes negative finding Cardiac  Comments:  Chest pain or chest pressure:    Shortness of breath upon exertion:    Short of breath when lying flat:    Irregular heart rhythm:        Vascular    Pain in calf, thigh, or hip brought on by ambulation:    Pain in feet at night that wakes you up from your sleep:     Blood clot in your veins:    Leg swelling:         Pulmonary    Oxygen at home:    Productive cough:     Wheezing:         Neurologic    Sudden weakness in arms or legs:     Sudden numbness in arms or legs:     Sudden onset of difficulty speaking or slurred speech:    Temporary loss of vision in one eye:     Problems with dizziness:         Gastrointestinal    Blood in stool:     Vomited blood:         Genitourinary    Burning when urinating:     Blood in urine:        Psychiatric    Major depression:         Hematologic    Bleeding problems:    Problems with blood clotting too easily:        Skin    Rashes or ulcers:        Constitutional    Fever or chills:      PHYSICAL EXAMINATION:  Vitals:   04/24/20 1039  BP: 136/76  Pulse: 69  Resp: 20  Temp: 97.9 F (36.6 C)  SpO2: 99%   General:  WDWN in NAD; vital signs documented above Gait: Unaided, no ataxia HENT: WNL, normocephalic Pulmonary: normal non-labored breathing , without Rales, rhonchi,  wheezing Cardiac: regular HR,  Skin: without rashes Vascular Exam/Pulses: Extremities: without ischemic changes, without Gangrene , without cellulitis; with open wounds;  approximately 1/2 to 1 cm ulcer involving skin edge disruption of the left medial ankle.  No  surrounding cellulitis.  No drainage.  She has dopplerable AT, digital, PT and peroneal signals on the left.  She has dopplerable digital, PT and peroneal signals on the right.  Motor function and sensation are intact Musculoskeletal: no muscle wasting or atrophy  Neurologic: A&O X 3;  No focal weakness or paresthesias are detected Psychiatric:  The pt has Normal affect.  Left medial ankle     Non-Invasive Vascular Imaging:   04/24/2020 ABI/TBIToday's ABIToday's TBIPrevious ABIPrevious TBI  +-------+-----------+-----------+------------+------------+  Right 0.56    0.29    0.46    0.27      +-------+-----------+-----------+------------+------------+  Left  0.67    0.33    0.49    0.29   Right great toe pressure is 48.  Waveforms are monophasic Left great toe pressure is 53 waveforms are monophasic  Arteriogram dated October 04, 2019 by Dr. Scot Dock: Right SFA occlusion with three-vessel runoff.  Left SFA occlusion with reconstitution of the popliteal artery at the level of the knee.  Posterior tibial and peroneal runoff.  ASSESSMENT/PLAN:: 84 y.o. female here for left medial malleolus ulcer.  Ischemic versus venous stasis.  She does not have edema.  Her ABIs and toe pressures are essentially unchanged and are no worse as compared to February of this year.  I recommended continue observation and protecting the wound with light dressing.  She can continue the ointment.  Continue walking program.  She is doing well with smoking cessation and states her goal is to be smoking only 1 cigarette/day when she presents for follow-up.  We will refill her statin and aspirin.  Encouraged her and her daughter-in-law to call our office if she develops rest pain or worsening appearance of this area.  Follow-up in 3 to 4 weeks to reassess wound healing.  History of bilateral  carotid artery stenosis.  Patient has follow-up carotid duplex appointment arranged in November.  Barbie Banner, PA-C [facility] [phone]  Clinic MD:   Donzetta Matters

## 2020-04-27 DIAGNOSIS — Z20822 Contact with and (suspected) exposure to covid-19: Secondary | ICD-10-CM | POA: Diagnosis not present

## 2020-05-20 ENCOUNTER — Other Ambulatory Visit: Payer: Self-pay

## 2020-05-20 ENCOUNTER — Ambulatory Visit (INDEPENDENT_AMBULATORY_CARE_PROVIDER_SITE_OTHER): Payer: Medicare Other | Admitting: Physician Assistant

## 2020-05-20 VITALS — BP 147/78 | HR 81 | Temp 97.8°F | Resp 20 | Ht 66.0 in | Wt 115.2 lb

## 2020-05-20 DIAGNOSIS — L97909 Non-pressure chronic ulcer of unspecified part of unspecified lower leg with unspecified severity: Secondary | ICD-10-CM

## 2020-05-20 DIAGNOSIS — I739 Peripheral vascular disease, unspecified: Secondary | ICD-10-CM

## 2020-05-20 DIAGNOSIS — I70299 Other atherosclerosis of native arteries of extremities, unspecified extremity: Secondary | ICD-10-CM

## 2020-05-20 NOTE — Progress Notes (Signed)
POST OPERATIVE OFFICE NOTE    CC:  F/u for surgery  HPI:  This is a 84 y.o. female who is here for a wound check on the medial malleolus of her left LE.  She is s/p angiogram  on 10/04/2019 by Dr. Scot Dock.   She was noted to have SFA occlusion on the left with reconstitution of the popliteal artery and there is two-vessel runoff on the left via the posterior tibial artery and peroneal artery.  She was at a funeral and stood still for 1 hour.  After that she developed pain at the wound site for 24 hours.  Since then she is back to baseline.  She stays active around the house and walks outside.  She has cut down on her smoking and take ASA and Statin daily.  She denise rest pain, changes with the wound appearence and no symptoms of claudication.   Allergies  Allergen Reactions  . Asa [Aspirin]     Stomach pain    Current Outpatient Medications  Medication Sig Dispense Refill  . acetaminophen (TYLENOL) 500 MG tablet Take 1,000 mg by mouth 2 (two) times daily as needed for moderate pain.    Marland Kitchen aspirin EC 81 MG tablet Take 1 tablet (81 mg total) by mouth daily. 150 tablet 2  . atorvastatin (LIPITOR) 10 MG tablet Take 1 tablet (10 mg total) by mouth daily. 30 tablet 11  . atorvastatin (LIPITOR) 10 MG tablet Take 1 tablet (10 mg total) by mouth daily. 30 tablet 6  . cetirizine (ZYRTEC) 10 MG tablet Take 10 mg by mouth daily.    . Multiple Vitamins-Minerals (ICAPS AREDS 2) CAPS Take 1 tablet by mouth in the morning and at bedtime.     . mupirocin cream (BACTROBAN) 2 % Apply 1 application topically every Wednesday.     . mupirocin ointment (BACTROBAN) 2 % Apply 1 application topically 2 (two) times daily. 22 g 0   No current facility-administered medications for this visit.     ROS:  See HPI  Physical Exam:   Extremities:  B LE doppler DP/PT/Peroneal signals.  Multiple varicose veins B LE without edema.  Motor and sensation are intact.  The wound appearence has not changed.  No drainage  and very superficial.   Lungs: non labored breathing Heart :  RRR  Assessment/Plan:   PAD with left medial malleolus chronic non healing wound.    This is a 84 y.o. female who is s/p: angiogram: 1. There are single renal arteries bilaterally with no significant renal artery stenosis identified. The infrarenal aorta has mild diffuse disease with some eccentric plaque in the mid aorta but this does not appear to produce a significant stenosis. 2. On the right side, which is the symptomatic side, the common iliac artery and external iliac artery are patent. The hypogastric artery is patent.  The common femoral artery is patent with 1 focal area of stenosis. The deep femoral artery has a proximal stenosis but is patent below that. The superficial femoral artery is occluded at its origin with reconstitution of the above-knee popliteal artery. There is three-vessel runoff on the right via the anterior tibial, posterior tibial, and peroneal arteries. The posterior tibial is a dominant runoff. 3. On the left side the common iliac external iliac and hypogastric arteries are patent. The common femoral and deep femoral artery are patent. The superficial femoral artery is occluded at its origin with reconstitution of the popliteal artery at the level of the knee. There is  two-vessel runoff on the left via the posterior tibial artery and peroneal artery.    The wound is unchanged without signs of infection.  She denise symptoms of claudication and rest pain.  At this point she is stable and so is the non healing wound.  She will cont. To work on decreased tobacco use, take her daily ASA and Statin.    Roxy Horseman PA-C [facility] [phone]  Clinic MD:  Scot Dock

## 2020-05-21 ENCOUNTER — Other Ambulatory Visit: Payer: Self-pay

## 2020-05-21 DIAGNOSIS — L97909 Non-pressure chronic ulcer of unspecified part of unspecified lower leg with unspecified severity: Secondary | ICD-10-CM

## 2020-06-10 ENCOUNTER — Encounter: Payer: Self-pay | Admitting: Vascular Surgery

## 2020-06-10 ENCOUNTER — Ambulatory Visit (INDEPENDENT_AMBULATORY_CARE_PROVIDER_SITE_OTHER): Payer: Medicare Other | Admitting: Vascular Surgery

## 2020-06-10 ENCOUNTER — Ambulatory Visit (HOSPITAL_COMMUNITY)
Admission: RE | Admit: 2020-06-10 | Discharge: 2020-06-10 | Disposition: A | Discharge status: Home / Self Care | Payer: Medicare Other | Source: Ambulatory Visit | Attending: Vascular Surgery | Admitting: Vascular Surgery

## 2020-06-10 ENCOUNTER — Ambulatory Visit (INDEPENDENT_AMBULATORY_CARE_PROVIDER_SITE_OTHER)
Admission: RE | Admit: 2020-06-10 | Discharge: 2020-06-10 | Disposition: A | Discharge status: Home / Self Care | Payer: Medicare Other | Source: Ambulatory Visit | Attending: Vascular Surgery | Admitting: Vascular Surgery

## 2020-06-10 ENCOUNTER — Other Ambulatory Visit: Payer: Self-pay

## 2020-06-10 VITALS — BP 130/63 | HR 68 | Temp 97.8°F | Resp 20 | Ht 66.0 in | Wt 115.0 lb

## 2020-06-10 DIAGNOSIS — R0989 Other specified symptoms and signs involving the circulatory and respiratory systems: Secondary | ICD-10-CM

## 2020-06-10 DIAGNOSIS — I70299 Other atherosclerosis of native arteries of extremities, unspecified extremity: Secondary | ICD-10-CM | POA: Diagnosis not present

## 2020-06-10 DIAGNOSIS — L97909 Non-pressure chronic ulcer of unspecified part of unspecified lower leg with unspecified severity: Secondary | ICD-10-CM | POA: Diagnosis not present

## 2020-06-10 DIAGNOSIS — I6523 Occlusion and stenosis of bilateral carotid arteries: Secondary | ICD-10-CM | POA: Diagnosis not present

## 2020-06-10 MED ORDER — MUPIROCIN 2 % EX OINT: TOPICAL_OINTMENT | CUTANEOUS | 1 refills | Status: DC

## 2020-06-10 MED ORDER — ATORVASTATIN CALCIUM 10 MG PO TABS
10.0000 mg | ORAL_TABLET | Freq: Every day | ORAL | Status: AC
Start: 2020-06-10 — End: 2020-07-10

## 2020-06-10 MED ORDER — ASPIRIN EC 81 MG PO TBEC: 81.0000 mg | DELAYED_RELEASE_TABLET | Freq: Every day | ORAL | 2 refills | Status: DC

## 2020-06-10 NOTE — Progress Notes (Signed)
REASON FOR VISIT:   Follow-up of peripheral vascular disease and carotid disease  MEDICAL ISSUES:   PERIPHERAL VASCULAR DISEASE: The patient's wounds on both feet have healed.  She is undergone a previous arteriogram and did not have any good options from an endovascular standpoint for revascularization.  Given her age she is not an ideal candidate for surgery.  However fortunately her circulation is stable and her ABIs are stable as documented below.  We have again had a long discussion about the importance of tobacco cessation.  According to the daughter she smoking up to a pack and a half a day.  She had smoked 2 packs/day previously.  I have refilled her prescription for Lipitor and aspirin.  I encouraged her to stay as active as possible.  RIGHT CAROTID STENOSIS: The patient has an asymptomatic 60 to 79% right carotid stenosis with no significant stenosis on the left.  She is asymptomatic.  She is on aspirin and is on a statin.  I explained we would not consider right carotid endarterectomy less the stenosis progressed to greater than 80% or she develop new right hemispheric symptoms.  I have ordered a follow-up carotid duplex scan in 6 months and I will see her back at that time.  She knows to call sooner if she has problems.   HPI:   Tamara Johnson is a pleasant 84 y.o. female who I last saw in February of this year.  She had presented with rest pain of the right foot and a wound on the lateral malleolus.  She underwent an arteriogram on 10/04/2019.  On the right side the common iliac and external iliac arteries were patent.  The common femoral artery was patent with 1 focal area of stenosis.  The deep femoral artery had a proximal stenosis but was patent below that.  The superficial femoral artery was occluded at its origin with reconstitution of the above-knee popliteal artery.  There was three-vessel runoff on the right.  She was not a candidate for an endovascular approach.  Given her  age I felt we would only consider bypass if the wounds fail to heal.  She comes in for routine follow-up visit.  She did have a right carotid bruit and also I recommended a carotid duplex scan.  Since I saw her last she has been putting Bactroban on her left medial malleolar wound which is now completely healed.  The wound on her right leg is also healed.  I do not get any symptoms of claudication although her activity is fairly limited.  She denies any history of rest pain.  She is on aspirin and is on a statin.  She continues to smoke.  With respect to her carotid stenosis I previously detected a carotid bruit which prompted a duplex scan which showed a 60 to 79% right carotid stenosis.  She denies any history of stroke, TIAs, expressive or receptive aphasia, or amaurosis fugax.  Past Medical History:  Diagnosis Date  . Cellulitis     Family History  Problem Relation Age of Onset  . Heart attack Father     SOCIAL HISTORY: Social History   Tobacco Use  . Smoking status: Current Every Day Smoker    Packs/day: 1.00    Types: Cigarettes  . Smokeless tobacco: Never Used  Substance Use Topics  . Alcohol use: No    Alcohol/week: 0.0 standard drinks    Allergies  Allergen Reactions  . Asa [Aspirin]     Stomach  pain    Current Outpatient Medications  Medication Sig Dispense Refill  . acetaminophen (TYLENOL) 500 MG tablet Take 1,000 mg by mouth 2 (two) times daily as needed for moderate pain.    Marland Kitchen aspirin EC 81 MG tablet Take 1 tablet (81 mg total) by mouth daily. 150 tablet 2  . atorvastatin (LIPITOR) 10 MG tablet Take 1 tablet (10 mg total) by mouth daily. 30 tablet 11  . atorvastatin (LIPITOR) 10 MG tablet Take 1 tablet (10 mg total) by mouth daily. 30 tablet 6  . cetirizine (ZYRTEC) 10 MG tablet Take 10 mg by mouth daily.    . Multiple Vitamins-Minerals (ICAPS AREDS 2) CAPS Take 1 tablet by mouth in the morning and at bedtime.     . mupirocin cream (BACTROBAN) 2 % Apply 1  application topically every Wednesday.     . mupirocin ointment (BACTROBAN) 2 % Apply 1 application topically 2 (two) times daily. 22 g 0   No current facility-administered medications for this visit.    REVIEW OF SYSTEMS:  [X]  denotes positive finding, [ ]  denotes negative finding Cardiac  Comments:  Chest pain or chest pressure:    Shortness of breath upon exertion:    Short of breath when lying flat:    Irregular heart rhythm:        Vascular    Pain in calf, thigh, or hip brought on by ambulation:    Pain in feet at night that wakes you up from your sleep:     Blood clot in your veins:    Leg swelling:         Pulmonary    Oxygen at home:    Productive cough:     Wheezing:         Neurologic    Sudden weakness in arms or legs:     Sudden numbness in arms or legs:     Sudden onset of difficulty speaking or slurred speech:    Temporary loss of vision in one eye:     Problems with dizziness:         Gastrointestinal    Blood in stool:     Vomited blood:         Genitourinary    Burning when urinating:     Blood in urine:        Psychiatric    Major depression:         Hematologic    Bleeding problems:    Problems with blood clotting too easily:        Skin    Rashes or ulcers:        Constitutional    Fever or chills:     PHYSICAL EXAM:   Vitals:   06/10/20 1123 06/10/20 1125  BP: 125/89 130/63  Pulse: 68   Resp: 20   Temp: 97.8 F (36.6 C)   SpO2: 90%   Weight: 115 lb (52.2 kg)   Height: 5\' 6"  (1.676 m)     GENERAL: The patient is a well-nourished female, in no acute distress. The vital signs are documented above. CARDIAC: There is a regular rate and rhythm.  VASCULAR: I do not detect carotid bruits. He has palpable femoral pulses. I cannot palpate pedal pulses. PULMONARY: There is good air exchange bilaterally without wheezing or rales. ABDOMEN: Soft and non-tender with normal pitched bowel sounds.  MUSCULOSKELETAL: There are no major  deformities or cyanosis. NEUROLOGIC: No focal weakness or paresthesias are detected. SKIN: There are no  ulcers or rashes noted. PSYCHIATRIC: The patient has a normal affect.  DATA:    ARTERIAL DOPPLER STUDY: I have independently interpreted her arterial Doppler study today.  On the right side there is a monophasic dorsalis pedis and posterior tibial signal.  The ABI is 55%.  The toe pressures 56 mmHg.  On the left side there is a monophasic dorsalis pedis and posterior tibial signal.  ABI 65%.  Toe pressure 62 mmHg.  Her ABIs have not changed significantly since her previous study.   CAROTID DUPLEX: I have independently interpreted her carotid duplex scan today.  On the right side there is a 60 to 79% stenosis.  The right vertebral artery is patent with antegrade flow.  On the left side there is a less than 39% stenosis.  The left vertebral artery is patent with antegrade flow.  Deitra Mayo [facility] [phone]

## 2020-06-14 DIAGNOSIS — F17210 Nicotine dependence, cigarettes, uncomplicated: Secondary | ICD-10-CM | POA: Diagnosis not present

## 2020-06-14 DIAGNOSIS — J441 Chronic obstructive pulmonary disease with (acute) exacerbation: Secondary | ICD-10-CM | POA: Diagnosis not present

## 2020-06-14 DIAGNOSIS — R0602 Shortness of breath: Secondary | ICD-10-CM | POA: Diagnosis not present

## 2020-06-14 DIAGNOSIS — R059 Cough, unspecified: Secondary | ICD-10-CM | POA: Diagnosis not present

## 2020-06-14 DIAGNOSIS — J439 Emphysema, unspecified: Secondary | ICD-10-CM | POA: Diagnosis not present

## 2020-06-14 DIAGNOSIS — J449 Chronic obstructive pulmonary disease, unspecified: Secondary | ICD-10-CM | POA: Diagnosis not present

## 2020-07-19 DIAGNOSIS — Z23 Encounter for immunization: Secondary | ICD-10-CM | POA: Diagnosis not present

## 2020-07-28 DIAGNOSIS — E119 Type 2 diabetes mellitus without complications: Secondary | ICD-10-CM | POA: Diagnosis not present

## 2020-07-28 DIAGNOSIS — E038 Other specified hypothyroidism: Secondary | ICD-10-CM | POA: Diagnosis not present

## 2020-07-28 DIAGNOSIS — I10 Essential (primary) hypertension: Secondary | ICD-10-CM | POA: Diagnosis not present

## 2020-07-28 DIAGNOSIS — E782 Mixed hyperlipidemia: Secondary | ICD-10-CM | POA: Diagnosis not present

## 2020-07-28 DIAGNOSIS — D518 Other vitamin B12 deficiency anemias: Secondary | ICD-10-CM | POA: Diagnosis not present

## 2020-07-28 DIAGNOSIS — E559 Vitamin D deficiency, unspecified: Secondary | ICD-10-CM | POA: Diagnosis not present

## 2020-07-28 DIAGNOSIS — J449 Chronic obstructive pulmonary disease, unspecified: Secondary | ICD-10-CM | POA: Diagnosis not present

## 2020-09-13 ENCOUNTER — Other Ambulatory Visit: Payer: Self-pay | Admitting: Vascular Surgery

## 2021-09-10 ENCOUNTER — Other Ambulatory Visit: Payer: Self-pay | Admitting: Vascular Surgery

## 2021-10-06 ENCOUNTER — Other Ambulatory Visit: Payer: Self-pay

## 2021-10-06 DIAGNOSIS — I6523 Occlusion and stenosis of bilateral carotid arteries: Secondary | ICD-10-CM

## 2021-10-21 ENCOUNTER — Ambulatory Visit (HOSPITAL_COMMUNITY)
Admission: RE | Admit: 2021-10-21 | Discharge: 2021-10-21 | Disposition: A | Discharge status: Home / Self Care | Payer: Medicare Other | Source: Ambulatory Visit | Attending: Vascular Surgery | Admitting: Vascular Surgery

## 2021-10-21 ENCOUNTER — Ambulatory Visit (INDEPENDENT_AMBULATORY_CARE_PROVIDER_SITE_OTHER): Payer: Medicare Other | Admitting: Vascular Surgery

## 2021-10-21 ENCOUNTER — Other Ambulatory Visit: Payer: Self-pay

## 2021-10-21 ENCOUNTER — Encounter: Payer: Self-pay | Admitting: Vascular Surgery

## 2021-10-21 VITALS — BP 151/66 | HR 86 | Temp 97.7°F | Resp 20 | Ht 66.0 in | Wt 118.0 lb

## 2021-10-21 DIAGNOSIS — I6521 Occlusion and stenosis of right carotid artery: Secondary | ICD-10-CM | POA: Diagnosis not present

## 2021-10-21 DIAGNOSIS — I6523 Occlusion and stenosis of bilateral carotid arteries: Secondary | ICD-10-CM | POA: Diagnosis present

## 2021-10-21 DIAGNOSIS — I739 Peripheral vascular disease, unspecified: Secondary | ICD-10-CM

## 2021-10-21 NOTE — Progress Notes (Signed)
? ? ?REASON FOR VISIT:  ? ?Follow-up of right carotid stenosis. ? ?MEDICAL ISSUES:  ? ?RIGHT CAROTID STENOSIS: This patient has an asymptomatic 60 to 79% right carotid stenosis.  She is on aspirin and is on a statin.  We have again discussed the importance of tobacco cessation.  I explained that we would not consider right carotid endarterectomy unless she developed right hemispheric symptoms or the stenosis progressed to greater than 80%.  I did explain that we need to follow this on a 81-monthinterval.  I have ordered a follow-up carotid duplex scan in 6 months and I will see her back at that time.  She knows to call sooner if she has problems. ? ?PERIPHERAL ARTERIAL DISEASE: This patient does have evidence of peripheral arterial disease bilaterally.  On the right side she has a known right superficial femoral artery occlusion.  She also has combined venous insufficiency.  She has been elevating her legs have encouraged her to not elevate her legs as this could potentially compromise her circulation.  She stated that it did hurt when she elevated her legs.  I have ordered follow-up ABIs when she returns in 6 months.  She will call sooner if the wound on the left leg is not improving or she develops any new wounds on the right side.  If she does develop worsening symptoms we would have to consider arteriography and reevaluate her for revascularization.  I have encouraged her to stay as active as possible ? ?HPI:  ? ?Tamara OFALLONis a pleasant 86y.o. female who comes in for continued follow-up of her peripheral arterial disease and right carotid stenosis.  I last saw her on 06/10/2020 with a 60 to 79% asymptomatic right carotid stenosis.  She was scheduled for a carotid duplex scan in 6 months but the best I can tell she was then lost to follow-up.  In addition I was following her with peripheral arterial disease.  She has a known superficial femoral artery occlusion on the right.  She had undergone an  arteriogram was not a candidate for endovascular approach.  Her only option on the right would have been a prosthetic for him below-knee pop bypass.  Her vein map showed that she did not have adequate vein for autogenous conduit. ? ?On my history today, the patient denies any history of stroke, TIAs, expressive or receptive aphasia, or amaurosis fugax.  She is on aspirin and is on a statin. ? ?She did bump her left leg and has a small wound on the pretibial area.  She is also complained of some soreness in her right ankle.  Its been somewhat erythematous.  She does not have any open wounds.  I do not get any clear-cut history of claudication although I think her activity is fairly limited.  She denies any history of rest pain. ? ?She is cut back to about 10 cigarettes a day. ? ?Past Medical History:  ?Diagnosis Date  ? Cellulitis   ? ? ?Family History  ?Problem Relation Age of Onset  ? Heart attack Father   ? ? ?SOCIAL HISTORY: ?Social History  ? ?Tobacco Use  ? Smoking status: Every Day  ?  Packs/day: 1.00  ?  Types: Cigarettes  ? Smokeless tobacco: Never  ?Substance Use Topics  ? Alcohol use: No  ?  Alcohol/week: 0.0 standard drinks  ? ? ?Allergies  ?Allergen Reactions  ? Asa [Aspirin]   ?  Stomach pain  ? ? ?Current Outpatient Medications  ?  Medication Sig Dispense Refill  ? acetaminophen (TYLENOL) 500 MG tablet Take 1,000 mg by mouth 2 (two) times daily as needed for moderate pain.    ? aspirin EC 81 MG tablet Take 1 tablet (81 mg total) by mouth daily. 150 tablet 2  ? ASPIRIN LOW DOSE 81 MG EC tablet TAKE 1 TABLET BY MOUTH EVERY DAY 150 tablet 2  ? atorvastatin (LIPITOR) 10 MG tablet Take 1 tablet (10 mg total) by mouth daily. 30 tablet 6  ? atorvastatin (LIPITOR) 10 MG tablet TAKE 1 TABLET(10 MG) BY MOUTH DAILY 30 tablet 11  ? cetirizine (ZYRTEC) 10 MG tablet Take 10 mg by mouth daily.    ? Multiple Vitamins-Minerals (ICAPS AREDS 2) CAPS Take 1 tablet by mouth in the morning and at bedtime.     ? mupirocin  cream (BACTROBAN) 2 % Apply 1 application topically every Wednesday.     ? mupirocin ointment (BACTROBAN) 2 % Apply to wound twice a day. 22 g 1  ? ?No current facility-administered medications for this visit.  ? ? ?REVIEW OF SYSTEMS:  ?'[X]'$  denotes positive finding, '[ ]'$  denotes negative finding ?Cardiac  Comments:  ?Chest pain or chest pressure:    ?Shortness of breath upon exertion: x   ?Short of breath when lying flat:    ?Irregular heart rhythm:    ?    ?Vascular    ?Pain in calf, thigh, or hip brought on by ambulation: x   ?Pain in feet at night that wakes you up from your sleep:     ?Blood clot in your veins:    ?Leg swelling:     ?    ?Pulmonary    ?Oxygen at home:    ?Productive cough:     ?Wheezing:     ?    ?Neurologic    ?Sudden weakness in arms or legs:     ?Sudden numbness in arms or legs:     ?Sudden onset of difficulty speaking or slurred speech:    ?Temporary loss of vision in one eye:     ?Problems with dizziness:     ?    ?Gastrointestinal    ?Blood in stool:     ?Vomited blood:     ?    ?Genitourinary    ?Burning when urinating:     ?Blood in urine:    ?    ?Psychiatric    ?Major depression:     ?    ?Hematologic    ?Bleeding problems:    ?Problems with blood clotting too easily:    ?    ?Skin    ?Rashes or ulcers: x   ?    ?Constitutional    ?Fever or chills:    ? ?PHYSICAL EXAM:  ? ?Vitals:  ? 10/21/21 1403 10/21/21 1406  ?BP: (!) 150/77 (!) 151/66  ?Pulse: 86   ?Resp: 20   ?Temp: 97.7 ?F (36.5 ?C)   ?SpO2: 92%   ?Weight: 118 lb (53.5 kg)   ?Height: '5\' 6"'$  (1.676 m)   ? ? ?GENERAL: The patient is a well-nourished female, in no acute distress. The vital signs are documented above. ?CARDIAC: There is a regular rate and rhythm.  ?VASCULAR: I do not detect carotid bruits. ?She has palpable femoral pulses. ?On the right side I cannot palpate pedal pulses.  She has a monophasic dorsalis pedis and posterior tibial signal with the Doppler. ?On the left side she has a biphasic posterior tibial signal with  a monophasic  anterior tibial signal. ?She has mild right lower extremity swelling. ? ? ? ? ?PULMONARY: There is good air exchange bilaterally without wheezing or rales. ?ABDOMEN: Soft and non-tender with normal pitched bowel sounds.  ?MUSCULOSKELETAL: There are no major deformities or cyanosis. ?NEUROLOGIC: No focal weakness or paresthesias are detected. ?SKIN: She has a wound on her left pretibial area where she bumped her leg. ? ? ?PSYCHIATRIC: The patient has a normal affect. ? ?DATA:   ? ?CAROTID DUPLEX: I have independently interpreted her carotid duplex scan today.   ? ?On the right side she has a 60 to 79% carotid stenosis.  End-diastolic velocity 78 cm/s.  The right vertebral artery is patent with antegrade flow. ? ?On the left side she has a less than 39% stenosis.  The left vertebral artery is patent with antegrade flow. ? ? ?Deitra Mayo ?[facility] ?Office [phone] ?

## 2021-10-25 ENCOUNTER — Other Ambulatory Visit: Payer: Self-pay | Admitting: *Deleted

## 2021-10-25 DIAGNOSIS — L97909 Non-pressure chronic ulcer of unspecified part of unspecified lower leg with unspecified severity: Secondary | ICD-10-CM

## 2021-10-25 DIAGNOSIS — I6521 Occlusion and stenosis of right carotid artery: Secondary | ICD-10-CM

## 2021-11-26 ENCOUNTER — Telehealth: Payer: Self-pay | Admitting: *Deleted

## 2021-11-26 NOTE — Telephone Encounter (Signed)
Patient son Tamara Johnson called and states patient wound is getting worse she is having drainage and low grade fever at times. Patient scheduled to be seen for wound check on 11/29/21 by PA. Advised patients son to take patient to Er if symptoms get worse. Ronnie verbalized understanding. ?

## 2021-11-29 ENCOUNTER — Other Ambulatory Visit: Payer: Self-pay

## 2021-11-29 ENCOUNTER — Ambulatory Visit (INDEPENDENT_AMBULATORY_CARE_PROVIDER_SITE_OTHER): Payer: Medicare Other | Admitting: Physician Assistant

## 2021-11-29 VITALS — BP 161/80 | HR 86 | Temp 98.2°F | Resp 20 | Ht 66.0 in | Wt 119.7 lb

## 2021-11-29 DIAGNOSIS — L97909 Non-pressure chronic ulcer of unspecified part of unspecified lower leg with unspecified severity: Secondary | ICD-10-CM | POA: Diagnosis not present

## 2021-11-29 DIAGNOSIS — S91001D Unspecified open wound, right ankle, subsequent encounter: Secondary | ICD-10-CM | POA: Diagnosis not present

## 2021-11-29 DIAGNOSIS — I70299 Other atherosclerosis of native arteries of extremities, unspecified extremity: Secondary | ICD-10-CM

## 2021-11-29 MED ORDER — OXYCODONE-ACETAMINOPHEN 5-325 MG PO TABS: 1.0000 | ORAL_TABLET | ORAL | 0 refills | Status: DC | PRN

## 2021-11-29 MED ORDER — CEPHALEXIN 500 MG PO CAPS: 500.0000 mg | ORAL_CAPSULE | Freq: Three times a day (TID) | ORAL | 0 refills | Status: DC

## 2021-11-29 NOTE — Progress Notes (Signed)
?Office Note  ? ? ? ?CC:  follow up ?Requesting Provider:  Bonnita Nasuti, MD ? ?HPI: Tamara Johnson is a 86 y.o. (12/09/1935) female who returns to clinic due to worsening right lateral ankle wound.  She was last seen by Dr. Scot Dock on 10/21/2021.  At that time, right lateral ankle wound was nearly healed.  She has a known right SFA occlusion.  She reports worsening pain and redness to this area.  It is keeping her from sleeping most nights.  She was sent back to our office from the wound center.  She is a daily smoker.  She continues to take aspirin, Plavix, statin daily.  She is also followed by Dr. Scot Dock for carotid artery stenosis.  She has a follow-up duplex in 6 months. ? ? ?Past Medical History:  ?Diagnosis Date  ? Cellulitis   ? ? ?Past Surgical History:  ?Procedure Laterality Date  ? ABDOMINAL AORTOGRAM W/LOWER EXTREMITY Bilateral 10/04/2019  ? Procedure: ABDOMINAL AORTOGRAM W/LOWER EXTREMITY;  Surgeon: Angelia Mould, MD;  Location: [facility];  Service: Cardiovascular;  Laterality: Bilateral;  ? PR VEIN BYPASS GRAFT,AORTO-FEM-POP    ? ? ?Social History  ? ?Socioeconomic History  ? Marital status: Married  ?  Spouse name: Not on file  ? Number of children: Not on file  ? Years of education: Not on file  ? Highest education level: Not on file  ?Occupational History  ? Not on file  ?Tobacco Use  ? Smoking status: Every Day  ?  Packs/day: 1.00  ?  Types: Cigarettes  ?  Passive exposure: Never  ? Smokeless tobacco: Never  ?Vaping Use  ? Vaping Use: Never used  ?Substance and Sexual Activity  ? Alcohol use: No  ?  Alcohol/week: 0.0 standard drinks  ? Drug use: No  ? Sexual activity: Not on file  ?Other Topics Concern  ? Not on file  ?Social History Narrative  ? Not on file  ? ?Social Determinants of Health  ? ?Financial Resource Strain: Not on file  ?Food Insecurity: Not on file  ?Transportation Needs: Not on file  ?Physical Activity: Not on file  ?Stress: Not on file  ?Social Connections: Not  on file  ?Intimate Partner Violence: Not on file  ? ? ?Family History  ?Problem Relation Age of Onset  ? Heart attack Father   ? ? ?Current Outpatient Medications  ?Medication Sig Dispense Refill  ? acetaminophen (TYLENOL) 500 MG tablet Take 1,000 mg by mouth 2 (two) times daily as needed for moderate pain.    ? aspirin EC 81 MG tablet Take 1 tablet (81 mg total) by mouth daily. 150 tablet 2  ? ASPIRIN LOW DOSE 81 MG EC tablet TAKE 1 TABLET BY MOUTH EVERY DAY 150 tablet 2  ? atorvastatin (LIPITOR) 10 MG tablet Take 1 tablet (10 mg total) by mouth daily. 30 tablet 6  ? atorvastatin (LIPITOR) 10 MG tablet TAKE 1 TABLET(10 MG) BY MOUTH DAILY 30 tablet 11  ? buPROPion (ZYBAN) 150 MG 12 hr tablet Take by mouth.    ? cephALEXin (KEFLEX) 500 MG capsule Take 1 capsule (500 mg total) by mouth 3 (three) times daily. 21 capsule 0  ? cetirizine (ZYRTEC) 10 MG tablet Take 10 mg by mouth daily.    ? FEROSUL 325 (65 Fe) MG tablet Take 325 mg by mouth daily.    ? levothyroxine (SYNTHROID) 25 MCG tablet Take 25 mcg by mouth every morning.    ? Multiple  Vitamins-Minerals (ICAPS AREDS 2) CAPS Take 1 tablet by mouth in the morning and at bedtime.     ? mupirocin cream (BACTROBAN) 2 % Apply 1 application topically every Wednesday.     ? mupirocin ointment (BACTROBAN) 2 % Apply to wound twice a day. 22 g 1  ? oxyCODONE-acetaminophen (PERCOCET/ROXICET) 5-325 MG tablet Take 1 tablet by mouth every 4 (four) hours as needed for severe pain. 20 tablet 0  ? rOPINIRole (REQUIP) 0.5 MG tablet Take 0.5 mg by mouth at bedtime.    ? SSD 1 % cream Apply topically daily.    ? triamcinolone cream (KENALOG) 0.1 % Apply topically 2 (two) times daily.    ? Vitamin D, Ergocalciferol, (DRISDOL) 1.25 MG (50000 UNIT) CAPS capsule Take 50,000 Units by mouth once a week.    ? ?No current facility-administered medications for this visit.  ? ? ?Allergies  ?Allergen Reactions  ? Asa [Aspirin]   ?  Stomach pain  ? ? ? ?REVIEW OF SYSTEMS:  ? ?'[X]'$  denotes positive  finding, '[ ]'$  denotes negative finding ?Cardiac  Comments:  ?Chest pain or chest pressure:    ?Shortness of breath upon exertion:    ?Short of breath when lying flat:    ?Irregular heart rhythm:    ?    ?Vascular    ?Pain in calf, thigh, or hip brought on by ambulation:    ?Pain in feet at night that wakes you up from your sleep:     ?Blood clot in your veins:    ?Leg swelling:     ?    ?Pulmonary    ?Oxygen at home:    ?Productive cough:     ?Wheezing:     ?    ?Neurologic    ?Sudden weakness in arms or legs:     ?Sudden numbness in arms or legs:     ?Sudden onset of difficulty speaking or slurred speech:    ?Temporary loss of vision in one eye:     ?Problems with dizziness:     ?    ?Gastrointestinal    ?Blood in stool:     ?Vomited blood:     ?    ?Genitourinary    ?Burning when urinating:     ?Blood in urine:    ?    ?Psychiatric    ?Major depression:     ?    ?Hematologic    ?Bleeding problems:    ?Problems with blood clotting too easily:    ?    ?Skin    ?Rashes or ulcers:    ?    ?Constitutional    ?Fever or chills:    ? ? ?PHYSICAL EXAMINATION: ? ?Vitals:  ? 11/29/21 1019  ?BP: (!) 161/80  ?Pulse: 86  ?Resp: 20  ?Temp: 98.2 ?F (36.8 ?C)  ?TempSrc: Temporal  ?SpO2: 95%  ?Weight: 119 lb 11.2 oz (54.3 kg)  ?Height: '5\' 6"'$  (1.676 m)  ? ? ?General:  WDWN in NAD; vital signs documented above ?Gait: Not observed ?HENT: WNL, normocephalic ?Pulmonary: normal non-labored breathing , without Rales, rhonchi,  wheezing ?Cardiac: regular HR ?Abdomen: soft, NT, no masses ?Skin: without rashes ?Vascular Exam/Pulses: ? Right Left  ?Radial 2+ (normal) 2+ (normal)  ?Femoral 2+ (normal) 2+ (normal)  ?DP absent absent  ?PT absent absent  ? ?Extremities: Multiple small superficial wounds with surrounding erythema of the right lateral ankle ?Musculoskeletal: no muscle wasting or atrophy  ?Neurologic: A&O X 3;  No focal weakness or paresthesias  are detected ?Psychiatric:  The pt has Normal affect. ? ? ?Non-Invasive Vascular Imaging:    ?None ? ? ? ? ?ASSESSMENT/PLAN:: 86 y.o. female returns to clinic due to worsening wound of right lateral ankle ? ?-Subjectively patient has had worsening pain and tissue changes over the right lateral ankle.  Pain is keeping her up at night. ?-Case was discussed with Dr. Trula Slade in the office today.  Dr. Scot Dock is out of town this week however I do not feel like treatment can wait an additional week.  Patient will be scheduled for right leg arteriogram with Dr. Trula Slade tomorrow 4/25.  This was discussed with the patient and her son including risks and they agree to proceed.  Patient will be bridged with Percocet.  I have also prescribed Keflex due to the appearance of the wound. ? ? ?Dagoberto Ligas, PA-C ?[facility] ?[phone] ? ?Clinic MD:   Trula Slade ? ?

## 2021-11-29 NOTE — H&P (View-Only) (Signed)
?Office Note  ? ? ? ?CC:  follow up ?Requesting Provider:  Bonnita Nasuti, MD ? ?HPI: Tamara Johnson is a 86 y.o. (1935/09/13) female who returns to clinic due to worsening right lateral ankle wound.  She was last seen by Dr. Scot Dock on 10/21/2021.  At that time, right lateral ankle wound was nearly healed.  She has a known right SFA occlusion.  She reports worsening pain and redness to this area.  It is keeping her from sleeping most nights.  She was sent back to our office from the wound center.  She is a daily smoker.  She continues to take aspirin, Plavix, statin daily.  She is also followed by Dr. Scot Dock for carotid artery stenosis.  She has a follow-up duplex in 6 months. ? ? ?Past Medical History:  ?Diagnosis Date  ? Cellulitis   ? ? ?Past Surgical History:  ?Procedure Laterality Date  ? ABDOMINAL AORTOGRAM W/LOWER EXTREMITY Bilateral 10/04/2019  ? Procedure: ABDOMINAL AORTOGRAM W/LOWER EXTREMITY;  Surgeon: Angelia Mould, MD;  Location: [facility];  Service: Cardiovascular;  Laterality: Bilateral;  ? PR VEIN BYPASS GRAFT,AORTO-FEM-POP    ? ? ?Social History  ? ?Socioeconomic History  ? Marital status: Married  ?  Spouse name: Not on file  ? Number of children: Not on file  ? Years of education: Not on file  ? Highest education level: Not on file  ?Occupational History  ? Not on file  ?Tobacco Use  ? Smoking status: Every Day  ?  Packs/day: 1.00  ?  Types: Cigarettes  ?  Passive exposure: Never  ? Smokeless tobacco: Never  ?Vaping Use  ? Vaping Use: Never used  ?Substance and Sexual Activity  ? Alcohol use: No  ?  Alcohol/week: 0.0 standard drinks  ? Drug use: No  ? Sexual activity: Not on file  ?Other Topics Concern  ? Not on file  ?Social History Narrative  ? Not on file  ? ?Social Determinants of Health  ? ?Financial Resource Strain: Not on file  ?Food Insecurity: Not on file  ?Transportation Needs: Not on file  ?Physical Activity: Not on file  ?Stress: Not on file  ?Social Connections: Not  on file  ?Intimate Partner Violence: Not on file  ? ? ?Family History  ?Problem Relation Age of Onset  ? Heart attack Father   ? ? ?Current Outpatient Medications  ?Medication Sig Dispense Refill  ? acetaminophen (TYLENOL) 500 MG tablet Take 1,000 mg by mouth 2 (two) times daily as needed for moderate pain.    ? aspirin EC 81 MG tablet Take 1 tablet (81 mg total) by mouth daily. 150 tablet 2  ? ASPIRIN LOW DOSE 81 MG EC tablet TAKE 1 TABLET BY MOUTH EVERY DAY 150 tablet 2  ? atorvastatin (LIPITOR) 10 MG tablet Take 1 tablet (10 mg total) by mouth daily. 30 tablet 6  ? atorvastatin (LIPITOR) 10 MG tablet TAKE 1 TABLET(10 MG) BY MOUTH DAILY 30 tablet 11  ? buPROPion (ZYBAN) 150 MG 12 hr tablet Take by mouth.    ? cephALEXin (KEFLEX) 500 MG capsule Take 1 capsule (500 mg total) by mouth 3 (three) times daily. 21 capsule 0  ? cetirizine (ZYRTEC) 10 MG tablet Take 10 mg by mouth daily.    ? FEROSUL 325 (65 Fe) MG tablet Take 325 mg by mouth daily.    ? levothyroxine (SYNTHROID) 25 MCG tablet Take 25 mcg by mouth every morning.    ? Multiple  Vitamins-Minerals (ICAPS AREDS 2) CAPS Take 1 tablet by mouth in the morning and at bedtime.     ? mupirocin cream (BACTROBAN) 2 % Apply 1 application topically every Wednesday.     ? mupirocin ointment (BACTROBAN) 2 % Apply to wound twice a day. 22 g 1  ? oxyCODONE-acetaminophen (PERCOCET/ROXICET) 5-325 MG tablet Take 1 tablet by mouth every 4 (four) hours as needed for severe pain. 20 tablet 0  ? rOPINIRole (REQUIP) 0.5 MG tablet Take 0.5 mg by mouth at bedtime.    ? SSD 1 % cream Apply topically daily.    ? triamcinolone cream (KENALOG) 0.1 % Apply topically 2 (two) times daily.    ? Vitamin D, Ergocalciferol, (DRISDOL) 1.25 MG (50000 UNIT) CAPS capsule Take 50,000 Units by mouth once a week.    ? ?No current facility-administered medications for this visit.  ? ? ?Allergies  ?Allergen Reactions  ? Asa [Aspirin]   ?  Stomach pain  ? ? ? ?REVIEW OF SYSTEMS:  ? ?'[X]'$  denotes positive  finding, '[ ]'$  denotes negative finding ?Cardiac  Comments:  ?Chest pain or chest pressure:    ?Shortness of breath upon exertion:    ?Short of breath when lying flat:    ?Irregular heart rhythm:    ?    ?Vascular    ?Pain in calf, thigh, or hip brought on by ambulation:    ?Pain in feet at night that wakes you up from your sleep:     ?Blood clot in your veins:    ?Leg swelling:     ?    ?Pulmonary    ?Oxygen at home:    ?Productive cough:     ?Wheezing:     ?    ?Neurologic    ?Sudden weakness in arms or legs:     ?Sudden numbness in arms or legs:     ?Sudden onset of difficulty speaking or slurred speech:    ?Temporary loss of vision in one eye:     ?Problems with dizziness:     ?    ?Gastrointestinal    ?Blood in stool:     ?Vomited blood:     ?    ?Genitourinary    ?Burning when urinating:     ?Blood in urine:    ?    ?Psychiatric    ?Major depression:     ?    ?Hematologic    ?Bleeding problems:    ?Problems with blood clotting too easily:    ?    ?Skin    ?Rashes or ulcers:    ?    ?Constitutional    ?Fever or chills:    ? ? ?PHYSICAL EXAMINATION: ? ?Vitals:  ? 11/29/21 1019  ?BP: (!) 161/80  ?Pulse: 86  ?Resp: 20  ?Temp: 98.2 ?F (36.8 ?C)  ?TempSrc: Temporal  ?SpO2: 95%  ?Weight: 119 lb 11.2 oz (54.3 kg)  ?Height: '5\' 6"'$  (1.676 m)  ? ? ?General:  WDWN in NAD; vital signs documented above ?Gait: Not observed ?HENT: WNL, normocephalic ?Pulmonary: normal non-labored breathing , without Rales, rhonchi,  wheezing ?Cardiac: regular HR ?Abdomen: soft, NT, no masses ?Skin: without rashes ?Vascular Exam/Pulses: ? Right Left  ?Radial 2+ (normal) 2+ (normal)  ?Femoral 2+ (normal) 2+ (normal)  ?DP absent absent  ?PT absent absent  ? ?Extremities: Multiple small superficial wounds with surrounding erythema of the right lateral ankle ?Musculoskeletal: no muscle wasting or atrophy  ?Neurologic: A&O X 3;  No focal weakness or paresthesias  are detected ?Psychiatric:  The pt has Normal affect. ? ? ?Non-Invasive Vascular Imaging:    ?None ? ? ? ? ?ASSESSMENT/PLAN:: 86 y.o. female returns to clinic due to worsening wound of right lateral ankle ? ?-Subjectively patient has had worsening pain and tissue changes over the right lateral ankle.  Pain is keeping her up at night. ?-Case was discussed with Dr. Trula Slade in the office today.  Dr. Scot Dock is out of town this week however I do not feel like treatment can wait an additional week.  Patient will be scheduled for right leg arteriogram with Dr. Trula Slade tomorrow 4/25.  This was discussed with the patient and her son including risks and they agree to proceed.  Patient will be bridged with Percocet.  I have also prescribed Keflex due to the appearance of the wound. ? ? ?Dagoberto Ligas, PA-C ?[facility] ?[phone] ? ?Clinic MD:   Trula Slade ? ?

## 2021-11-30 ENCOUNTER — Ambulatory Visit (HOSPITAL_COMMUNITY)
Admission: RE | Admit: 2021-11-30 | Discharge: 2021-11-30 | Disposition: A | Discharge status: Home / Self Care | Payer: Medicare Other | Attending: Surgery | Admitting: Surgery

## 2021-11-30 ENCOUNTER — Encounter (HOSPITAL_COMMUNITY)
Admission: RE | Disposition: A | Discharge status: Home / Self Care | Payer: Self-pay | Source: Home / Self Care | Attending: Surgery

## 2021-11-30 DIAGNOSIS — Z7902 Long term (current) use of antithrombotics/antiplatelets: Secondary | ICD-10-CM | POA: Diagnosis not present

## 2021-11-30 DIAGNOSIS — I70249 Atherosclerosis of native arteries of left leg with ulceration of unspecified site: Secondary | ICD-10-CM | POA: Diagnosis not present

## 2021-11-30 DIAGNOSIS — I6529 Occlusion and stenosis of unspecified carotid artery: Secondary | ICD-10-CM | POA: Diagnosis not present

## 2021-11-30 DIAGNOSIS — Z7982 Long term (current) use of aspirin: Secondary | ICD-10-CM | POA: Diagnosis not present

## 2021-11-30 DIAGNOSIS — I70233 Atherosclerosis of native arteries of right leg with ulceration of ankle: Secondary | ICD-10-CM | POA: Insufficient documentation

## 2021-11-30 DIAGNOSIS — F17210 Nicotine dependence, cigarettes, uncomplicated: Secondary | ICD-10-CM | POA: Diagnosis not present

## 2021-11-30 DIAGNOSIS — L97319 Non-pressure chronic ulcer of right ankle with unspecified severity: Secondary | ICD-10-CM | POA: Diagnosis not present

## 2021-11-30 HISTORY — PX: ABDOMINAL AORTOGRAM W/LOWER EXTREMITY: CATH118223

## 2021-11-30 HISTORY — PX: PERIPHERAL VASCULAR INTERVENTION: CATH118257

## 2021-11-30 LAB — POCT I-STAT, CHEM 8
BUN: 21 mg/dL (ref 8–23)
BUN: 36 mg/dL — ABNORMAL HIGH (ref 8–23)
Calcium, Ion: 1 mmol/L — ABNORMAL LOW (ref 1.15–1.40)
Calcium, Ion: 1.17 mmol/L (ref 1.15–1.40)
Chloride: 101 mmol/L (ref 98–111)
Chloride: 103 mmol/L (ref 98–111)
Creatinine, Ser: 0.6 mg/dL (ref 0.44–1.00)
Creatinine, Ser: 0.6 mg/dL (ref 0.44–1.00)
Glucose, Bld: 98 mg/dL (ref 70–99)
Glucose, Bld: 99 mg/dL (ref 70–99)
HCT: 35 % — ABNORMAL LOW (ref 36.0–46.0)
HCT: 36 % (ref 36.0–46.0)
Hemoglobin: 11.9 g/dL — ABNORMAL LOW (ref 12.0–15.0)
Hemoglobin: 12.2 g/dL (ref 12.0–15.0)
Potassium: 4.1 mmol/L (ref 3.5–5.1)
Potassium: 6.8 mmol/L (ref 3.5–5.1)
Sodium: 136 mmol/L (ref 135–145)
Sodium: 139 mmol/L (ref 135–145)
TCO2: 28 mmol/L (ref 22–32)
TCO2: 30 mmol/L (ref 22–32)

## 2021-11-30 SURGERY — ABDOMINAL AORTOGRAM W/LOWER EXTREMITY
Anesthesia: LOCAL

## 2021-11-30 MED ORDER — CLOPIDOGREL BISULFATE 75 MG PO TABS: 75.0000 mg | ORAL_TABLET | Freq: Every day | ORAL | 11 refills | Status: DC

## 2021-11-30 MED ORDER — MIDAZOLAM HCL 2 MG/2ML IJ SOLN
INTRAMUSCULAR | Status: DC | PRN
  Administered 2021-11-30: 2 mg via INTRAVENOUS
  Administered 2021-11-30: 1 mg via INTRAVENOUS

## 2021-11-30 MED ORDER — HEPARIN (PORCINE) IN NACL 1000-0.9 UT/500ML-% IV SOLN
INTRAVENOUS | Status: AC
  Filled 2021-11-30: qty 500

## 2021-11-30 MED ORDER — CLOPIDOGREL BISULFATE 75 MG PO TABS
ORAL_TABLET | ORAL | Status: AC
  Filled 2021-11-30: qty 1

## 2021-11-30 MED ORDER — SODIUM CHLORIDE 0.9 % IV SOLN: INTRAVENOUS | Status: DC

## 2021-11-30 MED ORDER — CLOPIDOGREL BISULFATE 75 MG PO TABS: 75.0000 mg | ORAL_TABLET | Freq: Every day | ORAL | Status: DC

## 2021-11-30 MED ORDER — HEPARIN (PORCINE) IN NACL 1000-0.9 UT/500ML-% IV SOLN
INTRAVENOUS | Status: DC | PRN
  Administered 2021-11-30 (×2): 500 mL

## 2021-11-30 MED ORDER — LIDOCAINE HCL (PF) 1 % IJ SOLN
INTRAMUSCULAR | Status: AC
Start: 2021-11-30 — End: ?
  Filled 2021-11-30: qty 30

## 2021-11-30 MED ORDER — HEPARIN SODIUM (PORCINE) 1000 UNIT/ML IJ SOLN
INTRAMUSCULAR | Status: AC
  Filled 2021-11-30: qty 10

## 2021-11-30 MED ORDER — ONDANSETRON HCL 4 MG/2ML IJ SOLN: 4.0000 mg | Freq: Four times a day (QID) | INTRAMUSCULAR | Status: DC | PRN

## 2021-11-30 MED ORDER — SODIUM CHLORIDE 0.9% FLUSH: 3.0000 mL | INTRAVENOUS | Status: DC | PRN

## 2021-11-30 MED ORDER — CLOPIDOGREL BISULFATE 75 MG PO TABS
ORAL_TABLET | ORAL | Status: DC | PRN
Start: 2021-11-30 — End: 2021-11-30
  Administered 2021-11-30: 75 mg via ORAL

## 2021-11-30 MED ORDER — MORPHINE SULFATE (PF) 2 MG/ML IV SOLN: 2.0000 mg | INTRAVENOUS | Status: DC | PRN

## 2021-11-30 MED ORDER — FENTANYL CITRATE (PF) 100 MCG/2ML IJ SOLN
INTRAMUSCULAR | Status: DC | PRN
  Administered 2021-11-30: 50 ug via INTRAVENOUS
  Administered 2021-11-30: 25 ug via INTRAVENOUS

## 2021-11-30 MED ORDER — SODIUM CHLORIDE 0.9 % WEIGHT BASED INFUSION: 1.0000 mL/kg/h | INTRAVENOUS | Status: DC

## 2021-11-30 MED ORDER — HYDRALAZINE HCL 20 MG/ML IJ SOLN: 5.0000 mg | INTRAMUSCULAR | Status: DC | PRN

## 2021-11-30 MED ORDER — MIDAZOLAM HCL 2 MG/2ML IJ SOLN
INTRAMUSCULAR | Status: AC
  Filled 2021-11-30: qty 2

## 2021-11-30 MED ORDER — FENTANYL CITRATE (PF) 100 MCG/2ML IJ SOLN
INTRAMUSCULAR | Status: AC
  Filled 2021-11-30: qty 2

## 2021-11-30 MED ORDER — LIDOCAINE HCL (PF) 1 % IJ SOLN
INTRAMUSCULAR | Status: DC | PRN
  Administered 2021-11-30: 20 mL

## 2021-11-30 MED ORDER — OXYCODONE HCL 5 MG PO TABS: 5.0000 mg | ORAL_TABLET | ORAL | Status: DC | PRN

## 2021-11-30 MED ORDER — HEPARIN SODIUM (PORCINE) 1000 UNIT/ML IJ SOLN
INTRAMUSCULAR | Status: DC | PRN
  Administered 2021-11-30: 6000 [IU] via INTRAVENOUS

## 2021-11-30 MED ORDER — SODIUM CHLORIDE 0.9 % IV SOLN: 250.0000 mL | INTRAVENOUS | Status: DC | PRN

## 2021-11-30 MED ORDER — FENTANYL CITRATE (PF) 100 MCG/2ML IJ SOLN
INTRAMUSCULAR | Status: AC
Start: 2021-11-30 — End: ?
  Filled 2021-11-30: qty 2

## 2021-11-30 MED ORDER — SODIUM CHLORIDE 0.9% FLUSH: 3.0000 mL | Freq: Two times a day (BID) | INTRAVENOUS | Status: DC

## 2021-11-30 MED ORDER — TRAMADOL HCL 50 MG PO TABS: 50.0000 mg | ORAL_TABLET | Freq: Four times a day (QID) | ORAL | 0 refills | Status: DC | PRN

## 2021-11-30 MED ORDER — LABETALOL HCL 5 MG/ML IV SOLN: 10.0000 mg | INTRAVENOUS | Status: DC | PRN

## 2021-11-30 MED ORDER — ACETAMINOPHEN 325 MG PO TABS: 650.0000 mg | ORAL_TABLET | ORAL | Status: DC | PRN

## 2021-11-30 MED ORDER — IODIXANOL 320 MG/ML IV SOLN
INTRAVENOUS | Status: DC | PRN
  Administered 2021-11-30: 100 mL via INTRA_ARTERIAL

## 2021-11-30 SURGICAL SUPPLY — 24 items
BAG SNAP BAND KOVER 36X36 (MISCELLANEOUS) ×1 IMPLANT
BALLN MUSTANG 5X150X135 (BALLOONS) ×3
BALLOON MUSTANG 5X150X135 (BALLOONS) IMPLANT
CATH MUSTANG 4X200X135 (BALLOONS) ×1 IMPLANT
CATH OMNI FLUSH 5F 65CM (CATHETERS) ×1 IMPLANT
CATH QUICKCROSS SUPP .035X90CM (MICROCATHETER) ×1 IMPLANT
COVER DOME SNAP 22 D (MISCELLANEOUS) ×1 IMPLANT
DEVICE TORQUE H2O (MISCELLANEOUS) ×1 IMPLANT
DEVICE VASC CLSR CELT ART 6 (Vascular Products) ×1 IMPLANT
GUIDEWIRE ANGLED .035X260CM (WIRE) ×1 IMPLANT
KIT ENCORE 26 ADVANTAGE (KITS) ×1 IMPLANT
KIT MICROPUNCTURE NIT STIFF (SHEATH) ×1 IMPLANT
KIT PV (KITS) ×3 IMPLANT
SHEATH PINNACLE 5F 10CM (SHEATH) ×1 IMPLANT
SHEATH PINNACLE 6F 10CM (SHEATH) ×1 IMPLANT
SHEATH PINNACLE MP 6F 45CM (SHEATH) ×1 IMPLANT
SHEATH PROBE COVER 6X72 (BAG) ×1 IMPLANT
STENT ELUVIA 6X150X130 (Permanent Stent) ×2 IMPLANT
STENT ELUVIA 6X80X130 (Permanent Stent) ×1 IMPLANT
SYR MEDRAD MARK V 150ML (SYRINGE) ×1 IMPLANT
TRANSDUCER W/STOPCOCK (MISCELLANEOUS) ×3 IMPLANT
TRAY PV CATH (CUSTOM PROCEDURE TRAY) ×3 IMPLANT
WIRE BENTSON .035X145CM (WIRE) ×1 IMPLANT
WIRE HI TORQ VERSACORE 300 (WIRE) ×1 IMPLANT

## 2021-11-30 NOTE — Progress Notes (Signed)
Patient had an EKG that was reading STEMI.  Dr Trula Slade aware, he asked to have cardiology review EKG.  Dr Casandra Doffing reviewed the EKG, states no STEMI, patient is not having any chest pain has never seen a cardiologist.  Dr Trula Slade aware of cardiology review of EKG. ? ? ?

## 2021-11-30 NOTE — Progress Notes (Signed)
CRITICAL RESULT PROVIDER NOTIFICATION ? ?Test performed and critical result:  12lead EKG ? ?Date and time result received:  11/30/2021 0945 ? ?Provider name/title: Dr Trula Slade ? ?Date and time provider notified: 11/30/2021 ? ?Date and time provider responded: 0945 ? ?Provider response:No new orders ? ? ? ?

## 2021-11-30 NOTE — Interval H&P Note (Signed)
History and Physical Interval Note: ? ?11/30/2021 ?10:52 AM ? ?MEAGHAN WHISTLER  has presented today for surgery, with the diagnosis of ischemia right lower extremity.  The various methods of treatment have been discussed with the patient and family. After consideration of risks, benefits and other options for treatment, the patient has consented to  Procedure(s): ?ABDOMINAL AORTOGRAM W/LOWER EXTREMITY (N/A) as a surgical intervention.  The patient's history has been reviewed, patient examined, no change in status, stable for surgery.  I have reviewed the patient's chart and labs.  Questions were answered to the patient's satisfaction.   ? ? ?Wells Kohl Polinsky ? ? ?

## 2021-11-30 NOTE — Op Note (Signed)
? ? ?  Patient name: Tamara Johnson MRN: [id number] DOB: [birth year] Sex: female ? ?11/30/2021 ?Pre-operative Diagnosis: right leg ulcer ?Post-operative diagnosis:  Same ?Surgeon:  Annamarie Major ?Procedure Performed: ? 1.  U/s guided access left femoral artery ? 2.  Abdominal aortogram ? 3.  Right leg runoff ? 4.  Stent, right SFA ? 5.  Conscious sedation, 55 minutes ? 6.  Closure device, Celt ? ? ? ?Indications: This is a 86 year old female with right lower extremity ulcer who comes in today for angiography. ? ?Procedure:  The patient was identified in the holding area and taken to room 8.  The patient was then placed supine on the table and prepped and draped in the usual sterile fashion.  A time out was called.  Conscious sedation was administered with the use of IV fentanyl and Versed under continuous physician and nurse monitoring.  Heart rate, blood pressure, and oxygen saturation were continuously monitored.  Total sedation time was 54mnutes.  Ultrasound was used to evaluate the left common femoral artery.  It was patent .  A digital ultrasound image was acquired.  A micropuncture needle was used to access the left common femoral artery under ultrasound guidance.  An 018 wire was advanced without resistance and a micropuncture sheath was placed.  The 018 wire was removed and a benson wire was placed.  The micropuncture sheath was exchanged for a 5 french sheath.  An omniflush catheter was advanced over the wire to the level of L-1.  An abdominal angiogram was obtained.  Next, using the omniflush catheter and a benson wire, the aortic bifurcation was crossed and the catheter was placed into theright external iliac artery and right runoff was obtained.   ? ?Findings:  ? Aortogram: No significant renal artery stenosis was identified.  The infrarenal abdominal aorta is widely patent.  Bilateral common and external iliac arteries are widely patent. ? Right Lower Extremity: The right common femoral and profundofemoral  artery are patent.  There is a high-grade stenosis at the origin of the profundofemoral artery.  The superficial femoral artery occludes just after its origin with reconstitution of the above-knee popliteal artery.  The below-knee popliteal artery is widely patent with three-vessel runoff. ? Left Lower Extremity: Not evaluated ? ?Intervention: After the above images were acquired the decision was made to proceed with intervention.  A 6 French 45 cm sheath was advanced over the aortic bifurcation and placed into the right external iliac artery.  The patient was fully heparinized.  Next using an 035 Glidewire and a quick cross catheter, subintimal recanalization was performed with reentry at the level of the patella.  This was confirmed with a contrast injection.  A versa core wire was then inserted.  The subintimal tract was then dilated with a 4 mm balloon.  I then placed overlapping Elluvia stents.  I used a 6 x 150 x 2 and a 6 x 80.  The stents were then postdilated with a 5 mm balloon.  Completion imaging showed widely patent superficial femoral artery with three-vessel runoff.  The groin was closed with a Celt ? ?Impression: ? #1  Right superficial femoral artery occlusion, successfully recanalized and stented with overlapping 6 mm Elluvia stents.  There is three-vessel runoff ? #2  High-grade ostial profunda stenosis on the right ? ? ?V. WAnnamarie Major M.D., FACS ?[facility] ?Office: [phone]?Pager:  [phone] ?

## 2021-12-01 ENCOUNTER — Encounter (HOSPITAL_COMMUNITY): Payer: Self-pay | Admitting: Surgery

## 2021-12-03 ENCOUNTER — Other Ambulatory Visit: Payer: Self-pay | Admitting: *Deleted

## 2021-12-03 DIAGNOSIS — I70299 Other atherosclerosis of native arteries of extremities, unspecified extremity: Secondary | ICD-10-CM

## 2021-12-03 DIAGNOSIS — I739 Peripheral vascular disease, unspecified: Secondary | ICD-10-CM

## 2021-12-13 NOTE — Progress Notes (Signed)
?HISTORY AND PHYSICAL  ? ? ? ?CC:  follow up. ?Requesting Provider:  Bonnita Nasuti, MD ? ?HPI: This is a 86 y.o. female who is here today for follow up for PAD.  Pt has hx of left femoral to below knee popliteal bypass with propaten 12/19/2008 by Dr. Scot Dock for medial malleolus wound.  She had hx of mixed arterial and venous insufficiency.  She had hx of bilateral vein harvest in the past.  ? ?She was seen in 2017 and her LLE bypass was occluded.  She had developed collaterals and her DP pulses were palpable bilaterally.   ? ?In 2021, she was seen for non healing wound on the right ankle and underwent aortogram but was not a candidate for endovascular revascularization.   She was seen back and her wound had healed.  She also had carotid duplex that revealed 60-79% right ICA stenosis without any neurological sx.  ? ?She was seen in March 2023 and she remained asymptomatic from her carotid disease.  She had a known right SFA occlusion with combined venous and arterial insufficiency.  She was seen back in April 2023 with worsening pain and redness of the RLE that was keeping her from sleeping.  She underwent arteriogram with stenting of the right SFA on 11/30/2021 by Dr. Trula Slade.   ? ?The pt returns today for follow up.  Her son is on the telephone and she has another gentleman with her in person.  She tells me that her wound is still painful.  She has been to the wound center a couple of times.  She was given medihoney.  She does not want to go back to the wound center.  She does not have any rest pain or claudication.  She did start having some swelling in the right leg about a week after the procedure.  Her son and support person present tell me that when she was home after the procedure, she was not on her feet very much and the swelling was better but once she was up and about, her right leg started to swell more.  This improves after she has been in bed overnight and is worse as she is on it throughout the day.    ? ?The pt is on a statin for cholesterol management.    ?The pt is on an aspirin.    Other AC:  Plavix ?The pt is not on medication for hypertension.  ?The pt does not have diabetes. ?Tobacco hx:  current-she is on Chantix ? ?Past Medical History:  ?Diagnosis Date  ? Cellulitis   ? ? ?Past Surgical History:  ?Procedure Laterality Date  ? ABDOMINAL AORTOGRAM W/LOWER EXTREMITY Bilateral 10/04/2019  ? Procedure: ABDOMINAL AORTOGRAM W/LOWER EXTREMITY;  Surgeon: Angelia Mould, MD;  Location: [facility];  Service: Cardiovascular;  Laterality: Bilateral;  ? ABDOMINAL AORTOGRAM W/LOWER EXTREMITY N/A 11/30/2021  ? Procedure: ABDOMINAL AORTOGRAM W/LOWER EXTREMITY;  Surgeon: Serafina Mitchell, MD;  Location: [facility];  Service: Cardiovascular;  Laterality: N/A;  ? PERIPHERAL VASCULAR INTERVENTION  11/30/2021  ? Procedure: PERIPHERAL VASCULAR INTERVENTION;  Surgeon: Serafina Mitchell, MD;  Location: [facility];  Service: Cardiovascular;;  ? PR VEIN BYPASS GRAFT,AORTO-FEM-POP    ? ? ?Allergies  ?Allergen Reactions  ? Asa [Aspirin]   ?  Stomach pain  ? ? ?Current Outpatient Medications  ?Medication Sig Dispense Refill  ? acetaminophen (TYLENOL) 500 MG tablet Take 1,000 mg by mouth 2 (two) times daily as  needed for moderate pain.    ? aspirin EC 81 MG tablet Take 1 tablet (81 mg total) by mouth daily. 150 tablet 2  ? ASPIRIN LOW DOSE 81 MG EC tablet TAKE 1 TABLET BY MOUTH EVERY DAY 150 tablet 2  ? atorvastatin (LIPITOR) 10 MG tablet Take 1 tablet (10 mg total) by mouth daily. 30 tablet 6  ? atorvastatin (LIPITOR) 10 MG tablet TAKE 1 TABLET(10 MG) BY MOUTH DAILY 30 tablet 11  ? buPROPion (ZYBAN) 150 MG 12 hr tablet Take by mouth.    ? cephALEXin (KEFLEX) 500 MG capsule Take 1 capsule (500 mg total) by mouth 3 (three) times daily. 21 capsule 0  ? cetirizine (ZYRTEC) 10 MG tablet Take 10 mg by mouth daily.    ? clopidogrel (PLAVIX) 75 MG tablet Take 1 tablet (75 mg total) by mouth daily. 30 tablet 11  ?  FEROSUL 325 (65 Fe) MG tablet Take 325 mg by mouth daily.    ? levothyroxine (SYNTHROID) 25 MCG tablet Take 25 mcg by mouth every morning.    ? Multiple Vitamins-Minerals (ICAPS AREDS 2) CAPS Take 1 tablet by mouth in the morning and at bedtime.     ? mupirocin cream (BACTROBAN) 2 % Apply 1 application topically every Wednesday.     ? mupirocin ointment (BACTROBAN) 2 % Apply to wound twice a day. 22 g 1  ? oxyCODONE-acetaminophen (PERCOCET/ROXICET) 5-325 MG tablet Take 1 tablet by mouth every 4 (four) hours as needed for severe pain. 20 tablet 0  ? rOPINIRole (REQUIP) 0.5 MG tablet Take 0.5 mg by mouth at bedtime.    ? SSD 1 % cream Apply topically daily.    ? traMADol (ULTRAM) 50 MG tablet Take 1 tablet (50 mg total) by mouth every 6 (six) hours as needed. 30 tablet 0  ? triamcinolone cream (KENALOG) 0.1 % Apply topically 2 (two) times daily.    ? Vitamin D, Ergocalciferol, (DRISDOL) 1.25 MG (50000 UNIT) CAPS capsule Take 50,000 Units by mouth once a week.    ? ?No current facility-administered medications for this visit.  ? ? ?Family History  ?Problem Relation Age of Onset  ? Heart attack Father   ? ? ?Social History  ? ?Socioeconomic History  ? Marital status: Married  ?  Spouse name: Not on file  ? Number of children: Not on file  ? Years of education: Not on file  ? Highest education level: Not on file  ?Occupational History  ? Not on file  ?Tobacco Use  ? Smoking status: Every Day  ?  Packs/day: 1.00  ?  Types: Cigarettes  ?  Passive exposure: Never  ? Smokeless tobacco: Never  ?Vaping Use  ? Vaping Use: Never used  ?Substance and Sexual Activity  ? Alcohol use: No  ?  Alcohol/week: 0.0 standard drinks  ? Drug use: No  ? Sexual activity: Not on file  ?Other Topics Concern  ? Not on file  ?Social History Narrative  ? Not on file  ? ?Social Determinants of Health  ? ?Financial Resource Strain: Not on file  ?Food Insecurity: Not on file  ?Transportation Needs: Not on file  ?Physical Activity: Not on file   ?Stress: Not on file  ?Social Connections: Not on file  ?Intimate Partner Violence: Not on file  ? ? ? ?REVIEW OF SYSTEMS:  ? ?'[X]'$  denotes positive finding, '[ ]'$  denotes negative finding ?Cardiac  Comments:  ?Chest pain or chest pressure:    ?Shortness of breath upon  exertion:    ?Short of breath when lying flat:    ?Irregular heart rhythm:    ?    ?Vascular    ?Pain in calf, thigh, or hip brought on by ambulation:    ?Pain in feet at night that wakes you up from your sleep:     ?Blood clot in your veins:    ?Leg swelling:     ?    ?Pulmonary    ?Oxygen at home:    ?Productive cough:     ?Wheezing:     ?    ?Neurologic    ?Sudden weakness in arms or legs:     ?Sudden numbness in arms or legs:     ?Sudden onset of difficulty speaking or slurred speech:    ?Temporary loss of vision in one eye:     ?Problems with dizziness:     ?    ?Gastrointestinal    ?Blood in stool:     ?Vomited blood:     ?    ?Genitourinary    ?Burning when urinating:     ?Blood in urine:    ?    ?Psychiatric    ?Major depression:     ?    ?Hematologic    ?Bleeding problems:    ?Problems with blood clotting too easily:    ?    ?Skin    ?Rashes or ulcers: x See HPI  ?    ?Constitutional    ?Fever or chills:    ? ? ?PHYSICAL EXAMINATION: ? ?Today's Vitals  ? 12/20/21 0852  ?BP: (!) 160/73  ?Pulse: 71  ?Resp: 16  ?Temp: (!) 97.2 ?F (36.2 ?C)  ?TempSrc: Temporal  ?SpO2: 97%  ?Weight: 113 lb (51.3 kg)  ?Height: 5' 6.5" (1.689 m)  ?PainSc: 10-Worst pain ever  ? ?Body mass index is 17.97 kg/m?. ? ? ?General:  WDWN in NAD; vital signs documented above ?Gait: Not observed ?HENT: WNL, normocephalic; HOH ?Pulmonary: normal non-labored breathing , without wheezing ?Cardiac: regular HR, without carotid bruits ?Skin: right foot with some dry areas and lateral malleolus wound ?Vascular Exam/Pulses: ?Easily palpable right DP pulse ?Mild RLE swelling ?Extremities:  ? ? ?Musculoskeletal: no muscle wasting or atrophy  ?Neurologic: A&O X 3 ?Psychiatric:  The pt has  Normal affect. ? ? ?Non-Invasive Vascular Imaging:   ?ABI's/TBI's on 12/20/2021: ?Right:  0.98/0.68 - Great toe pressure: 134 ?Left:  0.68/0.40 - Great toe pressure: 78 ? ?Arterial duplex on 12/20/2021: ?+---

## 2021-12-20 ENCOUNTER — Ambulatory Visit (INDEPENDENT_AMBULATORY_CARE_PROVIDER_SITE_OTHER): Payer: Medicare Other | Admitting: Physician Assistant

## 2021-12-20 ENCOUNTER — Encounter: Payer: Self-pay | Admitting: Physician Assistant

## 2021-12-20 ENCOUNTER — Ambulatory Visit (INDEPENDENT_AMBULATORY_CARE_PROVIDER_SITE_OTHER)
Admission: RE | Admit: 2021-12-20 | Discharge: 2021-12-20 | Disposition: A | Discharge status: Home / Self Care | Payer: Medicare Other | Source: Ambulatory Visit | Attending: Surgery | Admitting: Surgery

## 2021-12-20 ENCOUNTER — Ambulatory Visit (HOSPITAL_COMMUNITY)
Admission: RE | Admit: 2021-12-20 | Discharge: 2021-12-20 | Disposition: A | Discharge status: Home / Self Care | Payer: Medicare Other | Source: Ambulatory Visit | Attending: Surgery | Admitting: Surgery

## 2021-12-20 VITALS — BP 160/73 | HR 71 | Temp 97.2°F | Resp 16 | Ht 66.5 in | Wt 113.0 lb

## 2021-12-20 DIAGNOSIS — S91001D Unspecified open wound, right ankle, subsequent encounter: Secondary | ICD-10-CM | POA: Diagnosis not present

## 2021-12-20 DIAGNOSIS — I70299 Other atherosclerosis of native arteries of extremities, unspecified extremity: Secondary | ICD-10-CM

## 2021-12-20 DIAGNOSIS — I739 Peripheral vascular disease, unspecified: Secondary | ICD-10-CM | POA: Diagnosis not present

## 2021-12-20 DIAGNOSIS — L97909 Non-pressure chronic ulcer of unspecified part of unspecified lower leg with unspecified severity: Secondary | ICD-10-CM

## 2021-12-20 DIAGNOSIS — I6523 Occlusion and stenosis of bilateral carotid arteries: Secondary | ICD-10-CM

## 2021-12-28 ENCOUNTER — Other Ambulatory Visit: Payer: Self-pay | Admitting: *Deleted

## 2021-12-28 DIAGNOSIS — I739 Peripheral vascular disease, unspecified: Secondary | ICD-10-CM

## 2021-12-28 DIAGNOSIS — L97909 Non-pressure chronic ulcer of unspecified part of unspecified lower leg with unspecified severity: Secondary | ICD-10-CM

## 2021-12-28 DIAGNOSIS — L97321 Non-pressure chronic ulcer of left ankle limited to breakdown of skin: Secondary | ICD-10-CM

## 2021-12-28 DIAGNOSIS — I6523 Occlusion and stenosis of bilateral carotid arteries: Secondary | ICD-10-CM

## 2022-01-04 ENCOUNTER — Other Ambulatory Visit: Payer: Self-pay

## 2022-01-04 ENCOUNTER — Telehealth: Payer: Self-pay

## 2022-01-04 ENCOUNTER — Inpatient Hospital Stay (HOSPITAL_COMMUNITY)
Admission: EM | Admit: 2022-01-04 | Discharge: 2022-01-10 | DRG: 603 | Disposition: A | Discharge status: Home Health | Payer: Medicare Other | Attending: Family Medicine | Admitting: Family Medicine

## 2022-01-04 ENCOUNTER — Emergency Department (HOSPITAL_COMMUNITY): Payer: Medicare Other

## 2022-01-04 ENCOUNTER — Encounter (HOSPITAL_COMMUNITY): Payer: Self-pay | Admitting: Internal Medicine

## 2022-01-04 DIAGNOSIS — Z681 Body mass index (BMI) 19 or less, adult: Secondary | ICD-10-CM

## 2022-01-04 DIAGNOSIS — E785 Hyperlipidemia, unspecified: Secondary | ICD-10-CM | POA: Diagnosis present

## 2022-01-04 DIAGNOSIS — L97911 Non-pressure chronic ulcer of unspecified part of right lower leg limited to breakdown of skin: Secondary | ICD-10-CM

## 2022-01-04 DIAGNOSIS — Z22322 Carrier or suspected carrier of Methicillin resistant Staphylococcus aureus: Secondary | ICD-10-CM

## 2022-01-04 DIAGNOSIS — L0390 Cellulitis, unspecified: Secondary | ICD-10-CM | POA: Diagnosis present

## 2022-01-04 DIAGNOSIS — Z7951 Long term (current) use of inhaled steroids: Secondary | ICD-10-CM

## 2022-01-04 DIAGNOSIS — E440 Moderate protein-calorie malnutrition: Secondary | ICD-10-CM

## 2022-01-04 DIAGNOSIS — L03115 Cellulitis of right lower limb: Secondary | ICD-10-CM | POA: Diagnosis not present

## 2022-01-04 DIAGNOSIS — Z7982 Long term (current) use of aspirin: Secondary | ICD-10-CM

## 2022-01-04 DIAGNOSIS — I2510 Atherosclerotic heart disease of native coronary artery without angina pectoris: Secondary | ICD-10-CM | POA: Diagnosis present

## 2022-01-04 DIAGNOSIS — F17210 Nicotine dependence, cigarettes, uncomplicated: Secondary | ICD-10-CM

## 2022-01-04 DIAGNOSIS — S81801A Unspecified open wound, right lower leg, initial encounter: Secondary | ICD-10-CM

## 2022-01-04 DIAGNOSIS — J040 Acute laryngitis: Secondary | ICD-10-CM | POA: Diagnosis present

## 2022-01-04 DIAGNOSIS — Z79890 Hormone replacement therapy (postmenopausal): Secondary | ICD-10-CM

## 2022-01-04 DIAGNOSIS — R490 Dysphonia: Secondary | ICD-10-CM

## 2022-01-04 DIAGNOSIS — E039 Hypothyroidism, unspecified: Secondary | ICD-10-CM

## 2022-01-04 DIAGNOSIS — D649 Anemia, unspecified: Secondary | ICD-10-CM | POA: Diagnosis not present

## 2022-01-04 DIAGNOSIS — I739 Peripheral vascular disease, unspecified: Secondary | ICD-10-CM

## 2022-01-04 DIAGNOSIS — N179 Acute kidney failure, unspecified: Secondary | ICD-10-CM | POA: Diagnosis not present

## 2022-01-04 DIAGNOSIS — I872 Venous insufficiency (chronic) (peripheral): Secondary | ICD-10-CM | POA: Diagnosis present

## 2022-01-04 DIAGNOSIS — M79604 Pain in right leg: Secondary | ICD-10-CM

## 2022-01-04 DIAGNOSIS — Z79899 Other long term (current) drug therapy: Secondary | ICD-10-CM

## 2022-01-04 DIAGNOSIS — Z716 Tobacco abuse counseling: Secondary | ICD-10-CM

## 2022-01-04 DIAGNOSIS — Z66 Do not resuscitate: Secondary | ICD-10-CM | POA: Diagnosis present

## 2022-01-04 DIAGNOSIS — Z7902 Long term (current) use of antithrombotics/antiplatelets: Secondary | ICD-10-CM

## 2022-01-04 DIAGNOSIS — Z79891 Long term (current) use of opiate analgesic: Secondary | ICD-10-CM

## 2022-01-04 DIAGNOSIS — D638 Anemia in other chronic diseases classified elsewhere: Secondary | ICD-10-CM | POA: Diagnosis present

## 2022-01-04 DIAGNOSIS — Z720 Tobacco use: Secondary | ICD-10-CM | POA: Diagnosis present

## 2022-01-04 LAB — CBC WITH DIFFERENTIAL/PLATELET
Abs Immature Granulocytes: 0.03 10*3/uL (ref 0.00–0.07)
Basophils Absolute: 0 10*3/uL (ref 0.0–0.1)
Basophils Relative: 0 %
Eosinophils Absolute: 0 10*3/uL (ref 0.0–0.5)
Eosinophils Relative: 0 %
HCT: 36.5 % (ref 36.0–46.0)
Hemoglobin: 10.7 g/dL — ABNORMAL LOW (ref 12.0–15.0)
Immature Granulocytes: 0 %
Lymphocytes Relative: 19 %
Lymphs Abs: 1.5 10*3/uL (ref 0.7–4.0)
MCH: 30.2 pg (ref 26.0–34.0)
MCHC: 29.3 g/dL — ABNORMAL LOW (ref 30.0–36.0)
MCV: 103.1 fL — ABNORMAL HIGH (ref 80.0–100.0)
Monocytes Absolute: 1 10*3/uL (ref 0.1–1.0)
Monocytes Relative: 12 %
Neutro Abs: 5.5 10*3/uL (ref 1.7–7.7)
Neutrophils Relative %: 69 %
Platelets: 271 10*3/uL (ref 150–400)
RBC: 3.54 MIL/uL — ABNORMAL LOW (ref 3.87–5.11)
RDW: 16.6 % — ABNORMAL HIGH (ref 11.5–15.5)
WBC: 8.1 10*3/uL (ref 4.0–10.5)
nRBC: 0 % (ref 0.0–0.2)

## 2022-01-04 LAB — PHOSPHORUS: Phosphorus: 3.1 mg/dL (ref 2.5–4.6)

## 2022-01-04 LAB — BASIC METABOLIC PANEL
Anion gap: 9 (ref 5–15)
BUN: 23 mg/dL (ref 8–23)
CO2: 23 mmol/L (ref 22–32)
Calcium: 8.8 mg/dL — ABNORMAL LOW (ref 8.9–10.3)
Chloride: 107 mmol/L (ref 98–111)
Creatinine, Ser: 1.15 mg/dL — ABNORMAL HIGH (ref 0.44–1.00)
GFR, Estimated: 47 mL/min — ABNORMAL LOW (ref >60)
Glucose, Bld: 103 mg/dL — ABNORMAL HIGH (ref 70–99)
Potassium: 3.7 mmol/L (ref 3.5–5.1)
Sodium: 139 mmol/L (ref 135–145)

## 2022-01-04 LAB — HEPATIC FUNCTION PANEL
ALT: 12 U/L (ref 0–44)
AST: 17 U/L (ref 15–41)
Albumin: 2.8 g/dL — ABNORMAL LOW (ref 3.5–5.0)
Alkaline Phosphatase: 96 U/L (ref 38–126)
Bilirubin, Direct: 0.1 mg/dL (ref 0.0–0.2)
Indirect Bilirubin: 0.2 mg/dL — ABNORMAL LOW (ref 0.3–0.9)
Total Bilirubin: 0.3 mg/dL (ref 0.3–1.2)
Total Protein: 6.1 g/dL — ABNORMAL LOW (ref 6.5–8.1)

## 2022-01-04 LAB — RETICULOCYTES
Immature Retic Fract: 14.1 % (ref 2.3–15.9)
RBC.: 3.28 MIL/uL — ABNORMAL LOW (ref 3.87–5.11)
Retic Count, Absolute: 54.4 10*3/uL (ref 19.0–186.0)
Retic Ct Pct: 1.7 % (ref 0.4–3.1)

## 2022-01-04 LAB — CK: Total CK: 30 U/L — ABNORMAL LOW (ref 38–234)

## 2022-01-04 LAB — FOLATE: Folate: 11.8 ng/mL

## 2022-01-04 LAB — MAGNESIUM: Magnesium: 1.9 mg/dL (ref 1.7–2.4)

## 2022-01-04 LAB — TSH: TSH: 4.67 u[IU]/mL — ABNORMAL HIGH (ref 0.350–4.500)

## 2022-01-04 MED ORDER — BUPROPION HCL ER (SR) 150 MG PO TB12
150.0000 mg | ORAL_TABLET | Freq: Two times a day (BID) | ORAL | Status: DC
  Administered 2022-01-04 – 2022-01-10 (×13): 150 mg via ORAL
  Filled 2022-01-04 (×15): qty 1

## 2022-01-04 MED ORDER — OXYCODONE-ACETAMINOPHEN 5-325 MG PO TABS
1.0000 | ORAL_TABLET | ORAL | Status: DC | PRN
  Administered 2022-01-04 – 2022-01-07 (×8): 1 via ORAL
  Filled 2022-01-04 (×8): qty 1

## 2022-01-04 MED ORDER — ASPIRIN 81 MG PO TBEC
81.0000 mg | DELAYED_RELEASE_TABLET | Freq: Every day | ORAL | Status: DC
  Administered 2022-01-05 – 2022-01-10 (×6): 81 mg via ORAL
  Filled 2022-01-04 (×6): qty 1

## 2022-01-04 MED ORDER — LEVOTHYROXINE SODIUM 25 MCG PO TABS
25.0000 ug | ORAL_TABLET | Freq: Every day | ORAL | Status: DC
  Administered 2022-01-05 – 2022-01-10 (×6): 25 ug via ORAL
  Filled 2022-01-04 (×6): qty 1

## 2022-01-04 MED ORDER — ATORVASTATIN CALCIUM 10 MG PO TABS
10.0000 mg | ORAL_TABLET | Freq: Every day | ORAL | Status: DC
  Administered 2022-01-05 – 2022-01-10 (×6): 10 mg via ORAL
  Filled 2022-01-04 (×6): qty 1

## 2022-01-04 MED ORDER — ACETAMINOPHEN 650 MG RE SUPP: 650.0000 mg | Freq: Four times a day (QID) | RECTAL | Status: DC | PRN

## 2022-01-04 MED ORDER — SODIUM CHLORIDE 0.9 % IV SOLN
75.0000 mL/h | INTRAVENOUS | Status: AC
  Administered 2022-01-04: 75 mL/h via INTRAVENOUS

## 2022-01-04 MED ORDER — CEFAZOLIN SODIUM-DEXTROSE 1-4 GM/50ML-% IV SOLN
1.0000 g | Freq: Once | INTRAVENOUS | Status: AC
  Administered 2022-01-04: 1 g via INTRAVENOUS
  Filled 2022-01-04: qty 50

## 2022-01-04 MED ORDER — FENTANYL CITRATE PF 50 MCG/ML IJ SOSY
50.0000 ug | PREFILLED_SYRINGE | Freq: Once | INTRAMUSCULAR | Status: AC
  Administered 2022-01-04: 50 ug via INTRAVENOUS
  Filled 2022-01-04: qty 1

## 2022-01-04 MED ORDER — ACETAMINOPHEN 325 MG PO TABS
650.0000 mg | ORAL_TABLET | Freq: Four times a day (QID) | ORAL | Status: DC | PRN
  Administered 2022-01-08 – 2022-01-09 (×2): 650 mg via ORAL
  Filled 2022-01-04 (×3): qty 2

## 2022-01-04 MED ORDER — TRAMADOL HCL 50 MG PO TABS
50.0000 mg | ORAL_TABLET | Freq: Four times a day (QID) | ORAL | Status: DC | PRN
  Administered 2022-01-05 – 2022-01-06 (×2): 50 mg via ORAL
  Filled 2022-01-04 (×2): qty 1

## 2022-01-04 MED ORDER — FENTANYL CITRATE PF 50 MCG/ML IJ SOSY
50.0000 ug | PREFILLED_SYRINGE | Freq: Once | INTRAMUSCULAR | Status: AC
Start: 2022-01-04 — End: 2022-01-04
  Administered 2022-01-04: 50 ug via INTRAVENOUS
  Filled 2022-01-04: qty 1

## 2022-01-04 MED ORDER — SODIUM CHLORIDE 0.9 % IV SOLN
2.0000 g | Freq: Two times a day (BID) | INTRAVENOUS | Status: DC
  Administered 2022-01-04 – 2022-01-05 (×2): 2 g via INTRAVENOUS
  Filled 2022-01-04 (×2): qty 12.5

## 2022-01-04 MED ORDER — HYDROCODONE-ACETAMINOPHEN 5-325 MG PO TABS
1.0000 | ORAL_TABLET | ORAL | Status: DC | PRN
  Administered 2022-01-05: 1 via ORAL
  Administered 2022-01-06: 2 via ORAL
  Administered 2022-01-06: 1 via ORAL
  Administered 2022-01-07 – 2022-01-10 (×6): 2 via ORAL
  Filled 2022-01-04 (×5): qty 2
  Filled 2022-01-04: qty 1
  Filled 2022-01-04 (×2): qty 2
  Filled 2022-01-04: qty 1

## 2022-01-04 MED ORDER — ROPINIROLE HCL 0.5 MG PO TABS
0.5000 mg | ORAL_TABLET | Freq: Every day | ORAL | Status: DC
  Administered 2022-01-04 – 2022-01-09 (×6): 0.5 mg via ORAL
  Filled 2022-01-04 (×6): qty 1

## 2022-01-04 MED ORDER — VANCOMYCIN VARIABLE DOSE PER UNSTABLE RENAL FUNCTION (PHARMACIST DOSING): Status: DC

## 2022-01-04 MED ORDER — VANCOMYCIN HCL IN DEXTROSE 1-5 GM/200ML-% IV SOLN
1000.0000 mg | Freq: Once | INTRAVENOUS | Status: AC
  Administered 2022-01-04: 1000 mg via INTRAVENOUS
  Filled 2022-01-04: qty 200

## 2022-01-04 NOTE — ED Provider Notes (Signed)
[facility] EMERGENCY DEPARTMENT Provider Note   CSN: [id number] Arrival date & time: 01/04/22  1245     History  Chief Complaint  Patient presents with   Leg Pain    Tamara Johnson is a 86 y.o. female.   Leg Pain  86 year old female with extensive medical history significant for CAD status post left femoral to below the knee popliteal bypass in 2010 with Dr. Doren Custard, history of occluded left lower extremity bypass and 2017 with subsequent development of collaterals, nonhealing wound on the right ankle in 2021 with subsequent right SFA occlusion and worsening claudication with subsequent stenting of the right SFA on 11/30/2021 by Dr. Trula Slade who presents today with continued right foot wounds, worsening symptoms of claudication, worsening redness and swelling and pain in the right foot.  She remains on aspirin and Plavix.  She denies any dark sticky stools or dark stools in general.  She denies any shortness of breath, chest pain or lightheadedness.  She does continues to endorse sharp pain in her right foot and pain with ambulation.  She denies any fevers or chills.  Home Medications Prior to Admission medications   Medication Sig Start Date End Date Taking? Authorizing Provider  acetaminophen (TYLENOL) 500 MG tablet Take 1,000 mg by mouth 2 (two) times daily as needed for moderate pain.   Yes [provider]  ASPIRIN LOW DOSE 81 MG EC tablet TAKE 1 TABLET BY MOUTH EVERY DAY Patient taking differently: Take 81 mg by mouth daily. 09/13/21  Yes Angelia Mould, MD  atorvastatin (LIPITOR) 10 MG tablet Take 1 tablet (10 mg total) by mouth daily. 04/24/20  Yes Setzer, Ed[address], PA-C  Budeson-Glycopyrrol-Formoterol (BREZTRI AEROSPHERE) 160-9-4.8 MCG/ACT AERO Inhale 1 puff into the lungs in the morning and at bedtime.   Yes [provider]  buPROPion (ZYBAN) 150 MG 12 hr tablet Take 150 mg by mouth 2 (two) times daily. 11/04/21  Yes [provider]   clopidogrel (PLAVIX) 75 MG tablet Take 1 tablet (75 mg total) by mouth daily. 11/30/21  Yes Serafina Mitchell, MD  levothyroxine (SYNTHROID) 25 MCG tablet Take 25 mcg by mouth every morning. 09/08/21  Yes [provider]  loratadine (CLARITIN) 10 MG tablet Take 10 mg by mouth daily.   Yes [provider]  Multiple Vitamins-Minerals (ICAPS AREDS 2) CAPS Take 1 tablet by mouth in the morning and at bedtime.    Yes [provider]  rOPINIRole (REQUIP) 0.5 MG tablet Take 0.5 mg by mouth at bedtime. 11/17/21  Yes [provider]  traMADol (ULTRAM) 50 MG tablet Take 1 tablet (50 mg total) by mouth every 6 (six) hours as needed. Patient taking differently: Take 50 mg by mouth every 6 (six) hours as needed for moderate pain. 11/30/21  Yes Serafina Mitchell, MD  vitamin B-12 (CYANOCOBALAMIN) 500 MCG tablet Take 500 mcg by mouth daily.   Yes [provider]  Vitamin D, Ergocalciferol, (DRISDOL) 1.25 MG (50000 UNIT) CAPS capsule Take 50,000 Units by mouth once a week. 09/08/21  Yes [provider]  aspirin EC 81 MG tablet Take 1 tablet (81 mg total) by mouth daily. Patient not taking: Reported on 01/04/2022 04/24/20   Barbie Banner, PA-C  atorvastatin (LIPITOR) 10 MG tablet TAKE 1 TABLET(10 MG) BY MOUTH DAILY Patient not taking: Reported on 01/04/2022 09/14/20   Angelia Mould, MD  cephALEXin (KEFLEX) 500 MG capsule Take 1 capsule (500 mg total) by mouth 3 (three) times  daily. Patient not taking: Reported on 12/20/2021 11/29/21   Dagoberto Ligas, PA-C  cetirizine (ZYRTEC) 10 MG tablet Take 10 mg by mouth daily. Patient not taking: Reported on 01/04/2022    [provider]  FEROSUL 325 (65 Fe) MG tablet Take 325 mg by mouth daily. Patient not taking: Reported on 01/04/2022 10/16/21   [provider]  mupirocin cream (BACTROBAN) 2 % Apply 1 application topically every Wednesday.  Patient not taking: Reported on 01/04/2022    [provider]  mupirocin ointment (BACTROBAN) 2 % Apply to wound twice a day. Patient not taking: Reported on 01/04/2022 06/10/20   Angelia Mould, MD  oxyCODONE-acetaminophen (PERCOCET/ROXICET) 5-325 MG tablet Take 1 tablet by mouth every 4 (four) hours as needed for severe pain. Patient not taking: Reported on 12/20/2021 11/29/21   Dagoberto Ligas, PA-C  SSD 1 % cream Apply topically daily. Patient not taking: Reported on 01/04/2022 10/05/21   [provider]  triamcinolone cream (KENALOG) 0.1 % Apply topically 2 (two) times daily. Patient not taking: Reported on 01/04/2022 10/29/21   [provider]      Allergies    Patient has no known allergies.    Review of Systems   Review of Systems  All other systems reviewed and are negative.  Physical Exam Updated Vital Signs BP (!) 169/73   Pulse 80   Temp 98.7 F (37.1 C) (Oral)   Resp 16   SpO2 97%  Physical Exam Vitals and nursing note reviewed.  Constitutional:      General: She is not in acute distress.    Appearance: She is well-developed.  HENT:     Head: Normocephalic and atraumatic.  Eyes:     Conjunctiva/sclera: Conjunctivae normal.  Cardiovascular:     Rate and Rhythm: Normal rate and regular rhythm.  Pulmonary:     Effort: Pulmonary effort is normal. No respiratory distress.     Breath sounds: Normal breath sounds.  Abdominal:     Palpations: Abdomen is soft.     Tenderness: There is no abdominal tenderness.  Musculoskeletal:        General: Tenderness present. No swelling.     Cervical back: Neck supple.     Comments: Erythema and ulcerations noted to the right foot, intact 2+ dorsalis pedis pulse on the right  Skin:    General: Skin is warm and dry.     Capillary Refill: Capillary refill takes less than 2 seconds.     Findings: Erythema present.  Neurological:     Mental Status: She is alert.  Psychiatric:        Mood and Affect: Mood normal.       ED Results / Procedures /  Treatments   Labs (all labs ordered are listed, but only abnormal results are displayed) Labs Reviewed  BASIC METABOLIC PANEL - Abnormal; Notable for the following components:      Result Value   Glucose, Bld 103 (*)    Creatinine, Ser 1.15 (*)    Calcium 8.8 (*)    GFR, Estimated 47 (*)    All other components within normal limits  CBC WITH DIFFERENTIAL/PLATELET - Abnormal; Notable for the following components:   RBC 3.54 (*)    Hemoglobin 10.7 (*)    MCV 103.1 (*)    MCHC 29.3 (*)    RDW 16.6 (*)    All other components within normal limits  VITAMIN B12  FOLATE  IRON AND TIBC  FERRITIN  RETICULOCYTES  CK  CREATININE, URINE, RANDOM  HEPATIC FUNCTION PANEL  MAGNESIUM  OSMOLALITY  OSMOLALITY, URINE  PHOSPHORUS  PREALBUMIN  TSH  URINALYSIS, COMPLETE (UACMP) WITH MICROSCOPIC    EKG None  Radiology DG Ankle Complete Right  Result Date: 01/04/2022 CLINICAL DATA:  Right ankle ulcers.  Cellulitis EXAM: RIGHT ANKLE - COMPLETE 3+ VIEW COMPARISON:  None Available. FINDINGS: Diffuse osseous demineralization. Soft tissue wounds or ulcerations overlies the medial and lateral malleoli. No evidence of underlying bone erosion or periosteal elevation. No acute fracture or dislocation. IMPRESSION: 1. Soft tissue wounds or ulcerations overlies the medial and lateral malleoli. No radiographic evidence of underlying acute osteomyelitis. 2. Diffuse osseous demineralization. Electronically Signed   By: Davina Poke D.O.   On: 01/04/2022 15:07    Procedures Procedures    Medications Ordered in ED Medications  ceFAZolin (ANCEF) IVPB 1 g/50 mL premix (has no administration in time range)  fentaNYL (SUBLIMAZE) injection 50 mcg (has no administration in time range)  fentaNYL (SUBLIMAZE) injection 50 mcg (50 mcg Intravenous Given 01/04/22 1813)    ED Course/ Medical Decision Making/ A&P                           Medical Decision Making Risk Prescription drug management. Decision  regarding hospitalization.    86 year old female with extensive medical history significant for CAD status post left femoral to below the knee popliteal bypass in 2010 with Dr. Doren Custard, history of occluded left lower extremity bypass and 2017 with subsequent development of collaterals, nonhealing wound on the right ankle in 2021 with subsequent right SFA occlusion and worsening claudication with subsequent stenting of the right SFA on 11/30/2021 by Dr. Trula Slade who presents today with continued right foot wounds, worsening symptoms of claudication, worsening redness and swelling and pain in the right foot.  She remains on aspirin and Plavix.  She denies any dark sticky stools or dark stools in general.  She denies any shortness of breath, chest pain or lightheadedness.  She does continues to endorse sharp pain in her right foot and pain with ambulation.  She denies any fevers or chills.  On arrival, the patient was vitally stable.  No NSR noted on cardiac telemetry.  Physical exam significant for a right foot with an intact dorsalis pedis pulse with multiple superficial ulcerations noted bilaterally about the medial and lateral malleoli.  Patient endorsing worsening claudication and pain in her right foot.  Differential includes new ischemia although with an intact pulse acute limb ischemia appears to be less likely.  X-ray imaging was performed in triage, reviewed by myself and radiology negative for acute fracture or evidence of osteomyelitis.  Soft tissue ulcerations were noted on the plain film. IMPRESSION:  1. Soft tissue wounds or ulcerations overlies the medial and lateral  malleoli. No radiographic evidence of underlying acute  osteomyelitis.  2. Diffuse osseous demineralization.       No radiographic evidence of osteomyelitis present.  A CBC was performed with no leukocytosis noted, anemia to 10.7 which  has dropped slightly from the patient's previous measurements of 12.5-monthago.  The  patient denies any dark tarry stools or hematochezia.  BMP revealed a creatinine of 1.15 with an EGFR of 47, no significant electrolyte abnormality.  The patient was administered IV fentanyl for pain control.  Dr. DDoren Custardof vascular surgery was consulted and evaluated the patient.  He did not recommend any further imaging of the patient's limb at this time as  the patient does have intact palpable pulses in the right lower extremity.  He did recommend admission for cellulitis and pain control/pain management.  Dr. Roel Cluck hospitalist medicine was consulted for admission and accepted the patient in admission for further management.  Final Clinical Impression(s) / ED Diagnoses Final diagnoses:  Wound of right lower extremity, initial encounter  PAD (peripheral artery disease) ([place])  Cellulitis of right lower extremity  Right leg pain    Rx / DC Orders ED Discharge Orders     None         Regan Lemming, MD 01/04/22 1956

## 2022-01-04 NOTE — Assessment & Plan Note (Signed)
obtain anemia panel Hemoccult stool follow CBC

## 2022-01-04 NOTE — Assessment & Plan Note (Signed)
Keep foot elevated wound care consult antibiotic coverage follow-up with vascular Vascular surgery seen in consult recommends no vascular studies at this time

## 2022-01-04 NOTE — Subjective & Objective (Signed)
Patient with known severe PAD and chronic right ankle wound with known right SFA occlusion She already on aspirin and Plavix followed by vascular Dr. Doren Custard for carotid artery stenosis as well. Her right lower extremity wounds noted to be improving.  She developed more ulcers and increased pain she was instructed by wound care to come to emergency department Drainage has been increased as well as redness and swelling no fever no shortness of breath

## 2022-01-04 NOTE — Telephone Encounter (Signed)
Pt's son Edd Arbour called to make appt for pt. She has been seen by wound care but RLE wounds are not improving. He states there are now 6 wounds and there had been 4. I have scheduled her to see MD this week and advised them to call wound center and move her appt from Thursday to today or tomorrow. He verbalized understanding and was in agreement, as they told him to call them back once he had made her an appt with [place]. He mentioned her needing to be admitted possibly. I have advised him if he felt she needed to get to ED then they should do that.

## 2022-01-04 NOTE — ED Notes (Signed)
ED TO INPATIENT HANDOFF REPORT  ED Nurse Name and Phone #: Andi Hence, RN   S Name/Age/Gender Tamara Johnson 86 y.o. female Room/Bed: 046C/046C  Code Status   Code Status: Prior  Home/SNF/Other Home Patient oriented to: self, place, time, and situation Is this baseline? Yes   Triage Complete: Triage complete  Chief Complaint Cellulitis [K09.38]  Triage Note Pt sent here by wound doctor for worsening R leg ulcers after having 2 stents placed in R leg 2 weeks ago. Pt reports increased pain and that ulcers are not healing after her surgery.    Allergies No Known Allergies  Level of Care/Admitting Diagnosis ED Disposition     ED Disposition  Admit   Condition  --   Comment  Hospital Area: [place] [[id number]]  Level of Care: Telemetry Medical [104]  May place patient in observation at [facility] or [place] if equivalent level of care is available:: No  Covid Evaluation: Asymptomatic - no recent exposure (last 10 days) testing not required  Diagnosis: Cellulitis [182993]  Admitting Physician: Petros, [place]  Attending Physician: Toy Baker [[id number]]          B Medical/Surgery History Past Medical History:  Diagnosis Date   Cellulitis    Past Surgical History:  Procedure Laterality Date   ABDOMINAL AORTOGRAM W/LOWER EXTREMITY Bilateral 10/04/2019   Procedure: ABDOMINAL AORTOGRAM W/LOWER EXTREMITY;  Surgeon: Angelia Mould, MD;  Location: [place] [facility];  Service: Cardiovascular;  Laterality: Bilateral;   ABDOMINAL AORTOGRAM W/LOWER EXTREMITY N/A 11/30/2021   Procedure: ABDOMINAL AORTOGRAM W/LOWER EXTREMITY;  Surgeon: Serafina Mitchell, MD;  Location: [place] [facility];  Service: Cardiovascular;  Laterality: N/A;   PERIPHERAL VASCULAR INTERVENTION  11/30/2021   Procedure: PERIPHERAL VASCULAR INTERVENTION;  Surgeon: Serafina Mitchell, MD;  Location: [place] [facility];  Service: Cardiovascular;;   PR VEIN  BYPASS GRAFT,AORTO-FEM-POP       A IV Location/Drains/Wounds Patient Lines/Drains/Airways Status     Active Line/Drains/Airways     Name Placement date Placement time Site Days   Peripheral IV 01/04/22 22 G Anterior;Right Forearm 01/04/22  1813  Forearm  less than 1            Intake/Output Last 24 hours No intake or output data in the 24 hours ending 01/04/22 2028  Labs/Imaging Results for orders placed or performed during the hospital encounter of 01/04/22 (from the past 48 hour(s))  Basic metabolic panel     Status: Abnormal   Collection Time: 01/04/22  2:27 PM  Result Value Ref Range   Sodium 139 135 - 145 mmol/L   Potassium 3.7 3.5 - 5.1 mmol/L   Chloride 107 98 - 111 mmol/L   CO2 23 22 - 32 mmol/L   Glucose, Bld 103 (H) 70 - 99 mg/dL    Comment: Glucose reference range applies only to samples taken after fasting for at least 8 hours.   BUN 23 8 - 23 mg/dL   Creatinine, Ser 1.15 (H) 0.44 - 1.00 mg/dL   Calcium 8.8 (L) 8.9 - 10.3 mg/dL   GFR, Estimated 47 (L) >60 mL/min    Comment: (NOTE) Calculated using the CKD-EPI Creatinine Equation (2021)    Anion gap 9 5 - 15    Comment: Performed at [facility] [address], [place], [place] [address]  CBC with Differential     Status: Abnormal   Collection Time: 01/04/22  2:27 PM  Result Value Ref Range  WBC 8.1 4.0 - 10.5 K/uL   RBC 3.54 (L) 3.87 - 5.11 MIL/uL   Hemoglobin 10.7 (L) 12.0 - 15.0 g/dL   HCT 36.5 36.0 - 46.0 %   MCV 103.1 (H) 80.0 - 100.0 fL   MCH 30.2 26.0 - 34.0 pg   MCHC 29.3 (L) 30.0 - 36.0 g/dL   RDW 16.6 (H) 11.5 - 15.5 %   Platelets 271 150 - 400 K/uL   nRBC 0.0 0.0 - 0.2 %   Neutrophils Relative % 69 %   Neutro Abs 5.5 1.7 - 7.7 K/uL   Lymphocytes Relative 19 %   Lymphs Abs 1.5 0.7 - 4.0 K/uL   Monocytes Relative 12 %   Monocytes Absolute 1.0 0.1 - 1.0 K/uL   Eosinophils Relative 0 %   Eosinophils Absolute 0.0 0.0 - 0.5 K/uL   Basophils Relative 0 %   Basophils Absolute 0.0  0.0 - 0.1 K/uL   Immature Granulocytes 0 %   Abs Immature Granulocytes 0.03 0.00 - 0.07 K/uL    Comment: Performed at [facility] [address] [place], [place] [address]  Reticulocytes     Status: Abnormal   Collection Time: 01/04/22  8:02 PM  Result Value Ref Range   Retic Ct Pct 1.7 0.4 - 3.1 %   RBC. 3.28 (L) 3.87 - 5.11 MIL/uL   Retic Count, Absolute 54.4 19.0 - 186.0 K/uL   Immature Retic Fract 14.1 2.3 - 15.9 %    Comment: Performed at [facility] [address] [place], [place] [address]   DG Ankle Complete Right  Result Date: 01/04/2022 CLINICAL DATA:  Right ankle ulcers.  Cellulitis EXAM: RIGHT ANKLE - COMPLETE 3+ VIEW COMPARISON:  None Available. FINDINGS: Diffuse osseous demineralization. Soft tissue wounds or ulcerations overlies the medial and lateral malleoli. No evidence of underlying bone erosion or periosteal elevation. No acute fracture or dislocation. IMPRESSION: 1. Soft tissue wounds or ulcerations overlies the medial and lateral malleoli. No radiographic evidence of underlying acute osteomyelitis. 2. Diffuse osseous demineralization. Electronically Signed   By: Davina Poke D.O.   On: 01/04/2022 15:07    Pending Labs Unresulted Labs (From admission, onward)     Start     Ordered   01/05/22 0500  Prealbumin  Tomorrow morning,   R        01/04/22 1939   01/04/22 2008  MRSA Next Gen by PCR, Nasal  Once,   R        01/04/22 2007   01/04/22 1940  CK  Add-on,   AD        01/04/22 1939   01/04/22 1940  Creatinine, urine, random  Once,   URGENT        01/04/22 1939   01/04/22 1940  Hepatic function panel  Add-on,   AD       Question:  Release to patient  Answer:  Immediate   01/04/22 1939   01/04/22 1940  Magnesium  Add-on,   AD        01/04/22 1939   01/04/22 1940  Osmolality  Add-on,   AD        01/04/22 1939   01/04/22 1940  Osmolality, urine  Once,   URGENT        01/04/22 1939   01/04/22 1940  Phosphorus  Add-on,   AD        01/04/22  1939   01/04/22 1940  TSH  Add-on,   AD  01/04/22 1939   01/04/22 1940  Urinalysis, Complete w Microscopic  Once,   URGENT       Question:  Release to patient  Answer:  Immediate   01/04/22 1939   01/04/22 1939  Vitamin B12  (Anemia Panel (PNL))  Once,   URGENT        01/04/22 1938   01/04/22 1939  Folate  (Anemia Panel (PNL))  Once,   URGENT        01/04/22 1938   01/04/22 1939  Iron and TIBC  (Anemia Panel (PNL))  Once,   URGENT        01/04/22 1938   01/04/22 1939  Ferritin  (Anemia Panel (PNL))  Once,   URGENT        01/04/22 1938   Unscheduled  Occult blood card to lab, stool RN will collect  As needed,   R     Question Answer Comment  Specimen to be collected by: RN will collect   Release to patient Immediate      01/04/22 2010   Signed and Held  Comprehensive metabolic panel  Tomorrow morning,   R       Question:  Release to patient  Answer:  Immediate   Signed and Held   Signed and Held  CBC  Tomorrow morning,   R       Question:  Release to patient  Answer:  Immediate   Signed and Held            Vitals/Pain Today's Vitals   01/04/22 1658 01/04/22 1800 01/04/22 1846 01/04/22 2006  BP: (!) 169/73   (!) 153/60  Pulse: 80   78  Resp: 16   18  Temp:      TempSrc:      SpO2: 97%   94%  PainSc:  8  5      Isolation Precautions No active isolations  Medications Medications  ceFAZolin (ANCEF) IVPB 1 g/50 mL premix (1 g Intravenous New Bag/Given 01/04/22 2006)  vancomycin (VANCOCIN) IVPB 1000 mg/200 mL premix (has no administration in time range)  vancomycin variable dose per unstable renal function (pharmacist dosing) (has no administration in time range)  ceFEPIme (MAXIPIME) 2 g in sodium chloride 0.9 % 100 mL IVPB (has no administration in time range)  fentaNYL (SUBLIMAZE) injection 50 mcg (50 mcg Intravenous Given 01/04/22 1813)  fentaNYL (SUBLIMAZE) injection 50 mcg (50 mcg Intravenous Given 01/04/22 2008)    Mobility walks with device Low fall risk    Focused Assessments Cardiac Assessment Handoff:    No results found for: CKTOTAL, CKMB, CKMBINDEX, TROPONINI No results found for: DDIMER Does the Patient currently have chest pain? No   , Neuro Assessment Handoff:  Swallow screen pass? Yes          Neuro Assessment:   Neuro Checks:      Last Documented NIHSS Modified Score:   Has TPA been given? No If patient is a Neuro Trauma and patient is going to OR before floor call report to [place] nurse: [phone] or [phone]  , Pulmonary Assessment Handoff:  Lung sounds:   O2 Device: Nasal Cannula O2 Flow Rate (L/min): 2 L/min (patient states she wears 2LNC while sleeping)    R Recommendations: See Admitting Provider Note  Report given to:   Additional Notes: Patient on 2L [place] while sleeping. See Nursing Communication for MD request of patient's postioning. Patient is hard of hearing.

## 2022-01-04 NOTE — ED Provider Triage Note (Signed)
Emergency Medicine Provider Triage Evaluation Note  Tamara Johnson , a 86 y.o. female  was evaluated in triage.  Pt complains of worsening status and pain right leg ulcers.  With new ulcer as well.  Increased draining.  Redness and swelling increasing of the right leg.  Placed in leg 2 weeks ago.  Denies fever, leg pain, shortness of breath, loss of sensation, or pallor to the foot.  Review of Systems  Positive:  Negative: As above  Physical Exam  BP 140/67 (BP Location: Left Arm)   Pulse 85   Temp 98.7 F (37.1 C) (Oral)   Resp 14   SpO2 100%  Gen:   Awake, no distress   Resp:  Normal effort, CTAB MSK:   Moves extremities without difficulty  Other:  Ulcers with surrounding erythema going up to the ankle.  Full range of motion of both feet.  Sensation appears intact of both feet.  CRT less than 2 seconds of both feet.  RRR without M/R/G.  Abdomen soft nontender.  Medical Decision Making  Medically screening exam initiated at 2:17 PM.  Appropriate orders placed.  Tamara Johnson was informed that the remainder of the evaluation will be completed by another provider, this initial triage assessment does not replace that evaluation, and the importance of remaining in the ED until their evaluation is complete.  Labs and imaging ordered   Tamara Johnson, Tamara Johnson 87/68/11 1423

## 2022-01-04 NOTE — Assessment & Plan Note (Signed)
-  admit per  cellulitis protocol will       continue current antibiotic choice      plain films showed:   no evidence of air  no evidence of osteomyelitis   no     foreign   objects             Will obtain MRSA screening,          further antibiotic adjustment pending above results  

## 2022-01-04 NOTE — Consult Note (Signed)
ASSESSMENT & PLAN   RIGHT FOOT WOUNDS: This patient has combined chronic venous insufficiency and peripheral arterial disease.  She has undergone successful endovascular revascularization by Dr. Trula Slade on 11/30/2021.  She has a palpable right dorsalis pedis pulse and biphasic Doppler signals in the dorsalis pedis and posterior tibial position.  Given that she is having significant pain and has cellulitis I think she would benefit from admission for intravenous antibiotics and pain control.  Ultimately I think she will need to be followed at the wound care center as an outpatient.  She may ultimately require an Haematologist.  For now I would recommend leg elevation which I have ordered.  I will also write for dressing changes although I think it would be helpful to have the wound care team's input also.   We have discussed the importance of tobacco cessation.  REASON FOR CONSULT:    Right foot pain  HPI:   Tamara Johnson is a 86 y.o. female who has had a long history of wounds on her right foot and has known peripheral arterial disease and chronic venous insufficiency.  On 11/30/2021 she underwent successful endovascular revascularization.  She had a long segment superficial femoral artery occlusion which was addressed with angioplasty and stenting.  She had three-vessel runoff on the right.  She has had persistent pain in the right foot.  She denies being on antibiotics currently.  There has been no acute change in her symptoms but her pain was not improving and therefore she presented to the emergency department.  The patient has undergone a previous left femoral to below-knee popliteal artery bypass in the remote past which is chronically occluded.  She has no symptoms in the left leg.  Past Medical History:  Diagnosis Date   Cellulitis     Family History  Problem Relation Age of Onset   Heart attack Father     SOCIAL HISTORY: Social History   Tobacco Use   Smoking status: Every Day     Packs/day: 1.00    Types: Cigarettes    Passive exposure: Never   Smokeless tobacco: Never  Substance Use Topics   Alcohol use: No    Alcohol/week: 0.0 standard drinks    Allergies  Allergen Reactions   Asa [Aspirin]     Stomach pain    Current Facility-Administered Medications  Medication Dose Route Frequency Provider Last Rate Last Admin   ceFAZolin (ANCEF) IVPB 1 g/50 mL premix  1 g Intravenous Once Regan Lemming, MD       Current Outpatient Medications  Medication Sig Dispense Refill   acetaminophen (TYLENOL) 500 MG tablet Take 1,000 mg by mouth 2 (two) times daily as needed for moderate pain.     aspirin EC 81 MG tablet Take 1 tablet (81 mg total) by mouth daily. 150 tablet 2   ASPIRIN LOW DOSE 81 MG EC tablet TAKE 1 TABLET BY MOUTH EVERY DAY 150 tablet 2   atorvastatin (LIPITOR) 10 MG tablet Take 1 tablet (10 mg total) by mouth daily. 30 tablet 6   atorvastatin (LIPITOR) 10 MG tablet TAKE 1 TABLET(10 MG) BY MOUTH DAILY 30 tablet 11   buPROPion (ZYBAN) 150 MG 12 hr tablet Take by mouth.     cephALEXin (KEFLEX) 500 MG capsule Take 1 capsule (500 mg total) by mouth 3 (three) times daily. (Patient not taking: Reported on 12/20/2021) 21 capsule 0   cetirizine (ZYRTEC) 10 MG tablet Take 10 mg by mouth daily.  clopidogrel (PLAVIX) 75 MG tablet Take 1 tablet (75 mg total) by mouth daily. 30 tablet 11   FEROSUL 325 (65 Fe) MG tablet Take 325 mg by mouth daily.     levothyroxine (SYNTHROID) 25 MCG tablet Take 25 mcg by mouth every morning.     Multiple Vitamins-Minerals (ICAPS AREDS 2) CAPS Take 1 tablet by mouth in the morning and at bedtime.      mupirocin cream (BACTROBAN) 2 % Apply 1 application topically every Wednesday.      mupirocin ointment (BACTROBAN) 2 % Apply to wound twice a day. 22 g 1   oxyCODONE-acetaminophen (PERCOCET/ROXICET) 5-325 MG tablet Take 1 tablet by mouth every 4 (four) hours as needed for severe pain. (Patient not taking: Reported on 12/20/2021) 20  tablet 0   rOPINIRole (REQUIP) 0.5 MG tablet Take 0.5 mg by mouth at bedtime.     SSD 1 % cream Apply topically daily.     traMADol (ULTRAM) 50 MG tablet Take 1 tablet (50 mg total) by mouth every 6 (six) hours as needed. 30 tablet 0   triamcinolone cream (KENALOG) 0.1 % Apply topically 2 (two) times daily.     Vitamin D, Ergocalciferol, (DRISDOL) 1.25 MG (50000 UNIT) CAPS capsule Take 50,000 Units by mouth once a week.      REVIEW OF SYSTEMS:  '[X]'$  denotes positive finding, '[ ]'$  denotes negative finding Cardiac  Comments:  Chest pain or chest pressure:    Shortness of breath upon exertion:    Short of breath when lying flat:    Irregular heart rhythm:        Vascular    Pain in calf, thigh, or hip brought on by ambulation:    Pain in feet at night that wakes you up from your sleep:     Blood clot in your veins:    Leg swelling:         Pulmonary    Oxygen at home:    Productive cough:     Wheezing:         Neurologic    Sudden weakness in arms or legs:     Sudden numbness in arms or legs:     Sudden onset of difficulty speaking or slurred speech:    Temporary loss of vision in one eye:     Problems with dizziness:         Gastrointestinal    Blood in stool:     Vomited blood:         Genitourinary    Burning when urinating:     Blood in urine:        Psychiatric    Major depression:         Hematologic    Bleeding problems:    Problems with blood clotting too easily:        Skin    Rashes or ulcers: x       Constitutional    Fever or chills:    -  PHYSICAL EXAM:   Vitals:   01/04/22 1308 01/04/22 1658  BP: 140/67 (!) 169/73  Pulse: 85 80  Resp: 14 16  Temp: 98.7 F (37.1 C)   TempSrc: Oral   SpO2: 100% 97%   There is no height or weight on file to calculate BMI. GENERAL: The patient is a well-nourished female, in no acute distress. The vital signs are documented above. CARDIAC: There is a regular rate and rhythm.  VASCULAR: I do not detect carotid  bruits.  On the right side she has a palpable femoral, popliteal, and dorsalis pedis pulse.  She has a biphasic dorsalis pedis and posterior tibial signal with the Doppler. On the left side she has a palpable femoral pulse.  I cannot palpate a popliteal or pedal pulses. PULMONARY: There is good air exchange bilaterally without wheezing or rales. MUSCULOSKELETAL: There are no major deformities. NEUROLOGIC: No focal weakness or paresthesias are detected. SKIN: She has cellulitis of the right foot and 3 small open wounds as documented in the photographs below.     PSYCHIATRIC: The patient has a normal affect.  DATA:    LABS: White blood cell count is 8.1.  Hemoglobin 10.7.  Platelets 271,000.  GFR is 47.   Deitra Mayo [facility]

## 2022-01-04 NOTE — ED Triage Notes (Signed)
Pt sent here by wound doctor for worsening R leg ulcers after having 2 stents placed in R leg 2 weeks ago. Pt reports increased pain and that ulcers are not healing after her surgery.

## 2022-01-04 NOTE — ED Notes (Signed)
MD notified pt requesting pain meds

## 2022-01-04 NOTE — Assessment & Plan Note (Signed)
-   Spoke about importance of quitting spent 5 minutes discussing options for treatment, prior attempts at quitting, and dangers of smoking  -At this point patient is  NOT  interested in quitting  - refused  nicotine patch   - nursing tobacco cessation protocol  

## 2022-01-04 NOTE — Progress Notes (Signed)
Pharmacy Antibiotic Note  IYSIS Johnson is a 86 y.o. female for which pharmacy has been consulted for cefepime and vancomycin dosing for cellulitis.  SCr 1.15 - baseline ~0.6 WBC 8.1; T 98.7 F  Plan: Cefepime 2g q12hr Vancomycin 1000 mg once, subsequent dosing as indicated per random vancomycin level until renal function stable and/or improved, at which time scheduled dosing can be considered Trend WBC, Fever, Renal function, & Clinical course F/u cultures, clinical course, WBC, fever De-escalate when able     Temp (24hrs), Avg:98.7 F (37.1 C), Min:98.7 F (37.1 C), Max:98.7 F (37.1 C)  Recent Labs  Lab 01/04/22 1427  WBC 8.1  CREATININE 1.15*    CrCl cannot be calculated (Unknown ideal weight.).    No Known Allergies  Antimicrobials this admission: Cefazolin given in ED x 1 5/30 Cefepime 5/30 >>  Vancomycin 5/30 >>  Microbiology results: Pending  Thank you for allowing pharmacy to be a part of this patient's care.  Lorelei Pont, PharmD, BCPS 01/04/2022 8:12 PM ED Clinical Pharmacist -  [phone]

## 2022-01-04 NOTE — Assessment & Plan Note (Signed)
-   Check TSH continue home medications at current dose TSH slightly up  Check t3 t4

## 2022-01-04 NOTE — H&P (Signed)
Tamara Johnson JKK:[id number] DOB: [birth year] DOA: 01/04/2022     PCP: Bonnita Nasuti, MD   Outpatient Specialists:   Vascular  Dr. Trula Slade   Patient arrived to ER on 01/04/22 at 1245 Referred by Attending Regan Lemming, MD   Patient coming from:    home Lives With family    Chief Complaint:   Chief Complaint  Patient presents with   Leg Pain    HPI: Tamara Johnson is a 86 y.o. female with medical history significant of PAD, HLD, tobacco abuse, hypothyroidism    Presented with   right leg ulcers and pain Patient with known severe PAD and chronic right ankle wound with known right SFA occlusion She already on aspirin and Plavix followed by vascular Dr. Doren Custard for carotid artery stenosis as well. Her right lower extremity wounds noted to be improving.  She developed more ulcers and increased pain she was instructed by wound care to come to emergency department Drainage has been increased as well as redness and swelling no fever no shortness of breath  She has undergone successful endovascular revascularization by Dr. Trula Slade on 11/30/2021      Regarding pertinent Chronic problems:     Hyperlipidemia -  on statins Lipitor Lipid Panel  No results found for: CHOL, TRIG, HDL, CHOLHDL, VLDL, LDLCALC, LDLDIRECT, LABVLDL   HTN not on meds    PAD - on Aspirin and Plavix    Hypothyroidism: No results found for: TSH on synthroid        Chronic anemia - baseline hg Hemoglobin & Hematocrit  Recent Labs    11/30/21 0949 11/30/21 1000 01/04/22 1427  HGB 12.2 11.9* 10.7*     While in ER:   Seen by vascular recommend admission for pain control and antibiotics for cellulitis   Ordered Right ankle 1. Soft tissue wounds or ulcerations overlies the medial and lateral malleoli. No radiographic evidence of underlying acute osteomyelitis. 2. Diffuse osseous demineralization.  Following Medications were ordered in ER: Medications  ceFAZolin (ANCEF) IVPB 1 g/50 mL premix  (has no administration in time range)  fentaNYL (SUBLIMAZE) injection 50 mcg (50 mcg Intravenous Given 01/04/22 1813)    _______________________________________________________ ER Provider Called:   vascular  Dr.Dickson, Judeth Cornfield, MD They Recommend admit to medicine   Note states:She has a palpable right dorsalis pedis pulse and biphasic Doppler signals in the dorsalis pedis and posterior tibial position.  Given that she is having significant pain and has cellulitis I think she would benefit from admission for intravenous antibiotics and pain control.  Ultimately I think she will need to be followed at the wound care center as an outpatient.  She may ultimately require an Haematologist.  For now I would recommend leg elevation which I have ordered.  I will also write for dressing changes although I think it would be helpful to have the wound care team's input also.    SEEN in ER   ED Triage Vitals  Enc Vitals Group     BP 01/04/22 1308 140/67     Pulse Rate 01/04/22 1308 85     Resp 01/04/22 1308 14     Temp 01/04/22 1308 98.7 F (37.1 C)     Temp Source 01/04/22 1308 Oral     SpO2 01/04/22 1308 100 %     Weight --      Height --      Head Circumference --      Peak Flow --  Pain Score 01/04/22 1312 8     Pain Loc --      Pain Edu? --      Excl. in [place]? --   TMAX(24)@     _________________________________________ Significant initial  Findings: Abnormal Labs Reviewed  BASIC METABOLIC PANEL - Abnormal; Notable for the following components:      Result Value   Glucose, Bld 103 (*)    Creatinine, Ser 1.15 (*)    Calcium 8.8 (*)    GFR, Estimated 47 (*)    All other components within normal limits  CBC WITH DIFFERENTIAL/PLATELET - Abnormal; Notable for the following components:   RBC 3.54 (*)    Hemoglobin 10.7 (*)    MCV 103.1 (*)    MCHC 29.3 (*)    RDW 16.6 (*)    All other components within normal limits      ECG: Ordered      The recent clinical data is shown  below. Vitals:   01/04/22 1308 01/04/22 1658  BP: 140/67 (!) 169/73  Pulse: 85 80  Resp: 14 16  Temp: 98.7 F (37.1 C)   TempSrc: Oral   SpO2: 100% 97%    WBC     Component Value Date/Time   WBC 8.1 01/04/2022 1427   LYMPHSABS 1.5 01/04/2022 1427   MONOABS 1.0 01/04/2022 1427   EOSABS 0.0 01/04/2022 1427   BASOSABS 0.0 01/04/2022 1427        UA   ordered   ________________________________ Hospitalist was called for admission for   Wound of right lower extremity, initial encounter  PAD (peripheral artery disease) ([place])  Cellulitis of right lower extremity     The following Work up has been ordered so far:  Orders Placed This Encounter  Procedures   DG Ankle Complete Right   Basic metabolic panel   CBC with Differential   Nursing communication   Change dressing   Consult to vascular surgery   Consult for Unassigned Medical Admission     OTHER Significant initial  Findings:  labs showing:    Recent Labs  Lab 01/04/22 1427  NA 139  K 3.7  CO2 23  GLUCOSE 103*  BUN 23  CREATININE 1.15*  CALCIUM 8.8*    Cr   Up from baseline see below Lab Results  Component Value Date   CREATININE 1.15 (H) 01/04/2022   CREATININE 0.60 11/30/2021   CREATININE 0.60 11/30/2021    No results for input(s): AST, ALT, ALKPHOS, BILITOT, PROT, ALBUMIN in the last 168 hours. Lab Results  Component Value Date   CALCIUM 8.8 (L) 01/04/2022    Plt: Lab Results  Component Value Date   PLT 271 01/04/2022     COVID-19 Labs  No results for input(s): DDIMER, FERRITIN, LDH, CRP in the last 72 hours.  Lab Results  Component Value Date   SARSCOV2NAA NEGATIVE 10/03/2019      Recent Labs  Lab 01/04/22 1427  WBC 8.1  NEUTROABS 5.5  HGB 10.7*  HCT 36.5  MCV 103.1*  PLT 271    HG/HCT  Down  from baseline see below    Component Value Date/Time   HGB 10.7 (L) 01/04/2022 1427   HCT 36.5 01/04/2022 1427   MCV 103.1 (H) 01/04/2022 1427       Cultures: No results  found for: SDES, SPECREQUEST, CULT, REPTSTATUS   Radiological Exams on Admission: DG Ankle Complete Right  Result Date: 01/04/2022 CLINICAL DATA:  Right ankle ulcers.  Cellulitis EXAM: RIGHT ANKLE -  COMPLETE 3+ VIEW COMPARISON:  None Available. FINDINGS: Diffuse osseous demineralization. Soft tissue wounds or ulcerations overlies the medial and lateral malleoli. No evidence of underlying bone erosion or periosteal elevation. No acute fracture or dislocation. IMPRESSION: 1. Soft tissue wounds or ulcerations overlies the medial and lateral malleoli. No radiographic evidence of underlying acute osteomyelitis. 2. Diffuse osseous demineralization. Electronically Signed   By: Davina Poke D.O.   On: 01/04/2022 15:07   _______________________________________________________________________________________________________ Latest  Blood pressure (!) 169/73, pulse 80, temperature 98.7 F (37.1 C), temperature source Oral, resp. rate 16, SpO2 97 %.   Vitals  labs and radiology finding personally reviewed  Review of Systems:    Pertinent positives include:  fatigue,  Constitutional:  No weight loss, night sweats, Fevers, chills,  weight loss  HEENT:  No headaches, Difficulty swallowing,Tooth/dental problems,Sore throat,  No sneezing, itching, ear ache, nasal congestion, post nasal drip,  Cardio-vascular:  No chest pain, Orthopnea, PND, anasarca, dizziness, palpitations.no Bilateral lower extremity swelling  GI:  No heartburn, indigestion, abdominal pain, nausea, vomiting, diarrhea, change in bowel habits, loss of appetite, melena, blood in stool, hematemesis Resp:  no shortness of breath at rest. No dyspnea on exertion, No excess mucus, no productive cough, No non-productive cough, No coughing up of blood.No change in color of mucus.No wheezing. Skin:  no rash or lesions. No jaundice GU:  no dysuria, change in color of urine, no urgency or frequency. No straining to urinate.  No flank pain.   Musculoskeletal:  No joint pain or no joint swelling. No decreased range of motion. No back pain.  Psych:  No change in mood or affect. No depression or anxiety. No memory loss.  Neuro: no localizing neurological complaints, no tingling, no weakness, no double vision, no gait abnormality, no slurred speech, no confusion  All systems reviewed and apart from [place] all are negative _______________________________________________________________________________________________ Past Medical History:   Past Medical History:  Diagnosis Date   Cellulitis       Past Surgical History:  Procedure Laterality Date   ABDOMINAL AORTOGRAM W/LOWER EXTREMITY Bilateral 10/04/2019   Procedure: ABDOMINAL AORTOGRAM W/LOWER EXTREMITY;  Surgeon: Angelia Mould, MD;  Location: [facility];  Service: Cardiovascular;  Laterality: Bilateral;   ABDOMINAL AORTOGRAM W/LOWER EXTREMITY N/A 11/30/2021   Procedure: ABDOMINAL AORTOGRAM W/LOWER EXTREMITY;  Surgeon: Serafina Mitchell, MD;  Location: [facility];  Service: Cardiovascular;  Laterality: N/A;   PERIPHERAL VASCULAR INTERVENTION  11/30/2021   Procedure: PERIPHERAL VASCULAR INTERVENTION;  Surgeon: Serafina Mitchell, MD;  Location: [facility];  Service: Cardiovascular;;   PR VEIN BYPASS GRAFT,AORTO-FEM-POP      Social History:  Ambulatory   independently now needs walker        reports that she has been smoking cigarettes. She has been smoking an average of 1 pack per day. She has never been exposed to tobacco smoke. She has never used smokeless tobacco. She reports that she does not drink alcohol and does not use drugs.   Family History:   Family History  Problem Relation Age of Onset   Heart attack Father    ______________________________________________________________________________________________ Allergies: No Known Allergies   Prior to Admission medications   Medication Sig Start Date End Date Taking? Authorizing  Provider  acetaminophen (TYLENOL) 500 MG tablet Take 1,000 mg by mouth 2 (two) times daily as needed for moderate pain.   Yes [provider]  ASPIRIN LOW DOSE 81 MG EC tablet TAKE 1 TABLET BY MOUTH EVERY  DAY Patient taking differently: Take 81 mg by mouth daily. 09/13/21  Yes Angelia Mould, MD  atorvastatin (LIPITOR) 10 MG tablet Take 1 tablet (10 mg total) by mouth daily. 04/24/20  Yes Setzer, Edman Circle, PA-C  Budeson-Glycopyrrol-Formoterol (BREZTRI AEROSPHERE) 160-9-4.8 MCG/ACT AERO Inhale 1 puff into the lungs in the morning and at bedtime.   Yes [provider]  buPROPion (ZYBAN) 150 MG 12 hr tablet Take 150 mg by mouth 2 (two) times daily. 11/04/21  Yes [provider]  clopidogrel (PLAVIX) 75 MG tablet Take 1 tablet (75 mg total) by mouth daily. 11/30/21  Yes Serafina Mitchell, MD  levothyroxine (SYNTHROID) 25 MCG tablet Take 25 mcg by mouth every morning. 09/08/21  Yes [provider]  loratadine (CLARITIN) 10 MG tablet Take 10 mg by mouth daily.   Yes [provider]  Multiple Vitamins-Minerals (ICAPS AREDS 2) CAPS Take 1 tablet by mouth in the morning and at bedtime.    Yes [provider]  rOPINIRole (REQUIP) 0.5 MG tablet Take 0.5 mg by mouth at bedtime. 11/17/21  Yes [provider]  traMADol (ULTRAM) 50 MG tablet Take 1 tablet (50 mg total) by mouth every 6 (six) hours as needed. Patient taking differently: Take 50 mg by mouth every 6 (six) hours as needed for moderate pain. 11/30/21  Yes Serafina Mitchell, MD  vitamin B-12 (CYANOCOBALAMIN) 500 MCG tablet Take 500 mcg by mouth daily.   Yes [provider]  Vitamin D, Ergocalciferol, (DRISDOL) 1.25 MG (50000 UNIT) CAPS capsule Take 50,000 Units by mouth once a week. 09/08/21  Yes [provider]  aspirin EC 81 MG tablet Take 1 tablet (81 mg total) by mouth daily. Patient not taking: Reported on 01/04/2022 04/24/20   Barbie Banner, PA-C  atorvastatin (LIPITOR)  10 MG tablet TAKE 1 TABLET(10 MG) BY MOUTH DAILY Patient not taking: Reported on 01/04/2022 09/14/20   Angelia Mould, MD  cephALEXin (KEFLEX) 500 MG capsule Take 1 capsule (500 mg total) by mouth 3 (three) times daily. Patient not taking: Reported on 12/20/2021 11/29/21   Dagoberto Ligas, PA-C  cetirizine (ZYRTEC) 10 MG tablet Take 10 mg by mouth daily. Patient not taking: Reported on 01/04/2022    [provider]  FEROSUL 325 (65 Fe) MG tablet Take 325 mg by mouth daily. Patient not taking: Reported on 01/04/2022 10/16/21   [provider]  mupirocin cream (BACTROBAN) 2 % Apply 1 application topically every Wednesday.  Patient not taking: Reported on 01/04/2022    [provider]  mupirocin ointment (BACTROBAN) 2 % Apply to wound twice a day. Patient not taking: Reported on 01/04/2022 06/10/20   Angelia Mould, MD  oxyCODONE-acetaminophen (PERCOCET/ROXICET) 5-325 MG tablet Take 1 tablet by mouth every 4 (four) hours as needed for severe pain. Patient not taking: Reported on 12/20/2021 11/29/21   Dagoberto Ligas, PA-C  SSD 1 % cream Apply topically daily. Patient not taking: Reported on 01/04/2022 10/05/21   [provider]  triamcinolone cream (KENALOG) 0.1 % Apply topically 2 (two) times daily. Patient not taking: Reported on 01/04/2022 10/29/21   [provider]    ___________________________________________________________________________________________________ Physical Exam:    01/04/2022    4:58 PM 01/04/2022    1:08 PM 12/20/2021    8:52 AM  Vitals with BMI  Height   5' 6.5"  Weight   113 lbs  BMI   55.73  Systolic 220 254 270  Diastolic 73 67 73  Pulse 80 85  71     1. General:  in No  Acute distress   Chronically ill   -appearing 2. Psychological: Alert and  Oriented 3. Head/ENT:    Dry Mucous Membranes                          Head Non traumatic, neck supple                         Poor Dentition 4. SKIN:  decreased  Skin turgor,  Skin clean Dry right ankle wound     5. Heart: Regular rate and rhythm no  Murmur, no Rub or gallop 6. Lungs:  no wheezes or crackles   7. Abdomen: Soft,  non-tender, Non distended bowel sounds present 8. Lower extremities: no clubbing, cyanosis, no  edema 9. Neurologically Grossly intact, moving all 4 extremities equally   10. MSK: Normal range of motion    Chart has been reviewed  ______________________________________________________________________________________________  Assessment/Plan  86 y.o. female with medical history significant of PAD, HLD    Admitted for Wound of right lower extremity, initial encounter  PAD (peripheral artery disease) ([place])  Cellulitis of right lower extremity    Present on Admission:  Cellulitis of right leg  PAD (peripheral artery disease) ([facility])  Leg ulcer, right, limited to breakdown of skin ([facility])  Anemia  AKI (acute kidney injury) ([place])  Hypothyroidism  Tobacco abuse     Cellulitis of right leg -admit per  cellulitis protocol will        continue current antibiotic choice      plain films showed no evidence of air  no evidence of osteomyelitis   no     foreign   objects       Will obtain MRSA screening,           further antibiotic adjustment pending above results   PAD (peripheral artery disease) ([facility]) Continue aspirin and Plavix  Leg ulcer, right, limited to breakdown of skin ([place]) Keep foot elevated wound care consult antibiotic coverage follow-up with vascular Vascular surgery seen in consult recommends no vascular studies at this time  Anemia  obtain anemia panel Hemoccult stool follow CBC  AKI (acute kidney injury) ([place]) Obtain urine electrolytes gently rehydrate and follow fluid status  Hypothyroidism - Check TSH continue home medications at current dose TSH slightly up  Check t3 t4  Tobacco abuse  - Spoke about importance of quitting spent 5 minutes discussing options for treatment, prior  attempts at quitting, and dangers of smoking  -At this point patient is NOT  interested in quitting  - refused nicotine patch   - nursing tobacco cessation protocol   Other plan as per orders.  DVT prophylaxis:  SCD      Code Status:  DNR/DNI  as per patient  family  I had personally discussed CODE STATUS with patient and family     Family Communication:   Family   at  Bedside  plan of care was discussed  with   Son,   Disposition Plan:      To home once workup is complete and patient is stable   Following barriers for discharge:  Anemia stable                             Pain controlled with PO medications                                                           Will need consultants to evaluate patient prior to discharge                       Would benefit from PT/OT eval prior to DC  Ordered                                     Wound care  consulted                                      Consults called:  vascular is aware  Admission status:  ED Disposition     ED Disposition  Admit   Condition  --   [place]: [place] [100100]  Level of Care: Telemetry Medical [104]  [facility] place patient in [facility] at [facility] or [facility] if equivalent level of care is available:: No  Covid Evaluation: Asymptomatic - no recent exposure (last 10 days) testing not required  Diagnosis: Cellulitis [270786]  Admitting Physician: Toy Baker [[id number]]  Attending Physician: Toy Baker [[id number]]           Obs    Level of care     tele  For 12H      Seraya Jobst 01/04/2022, 9:56 PM    [facility]     after 2 AM please page floor coverage PA If 7AM-7PM, please contact the day team taking care of the patient using [email or web address]   Patient was evaluated in the context of the global COVID-19 pandemic, which necessitated consideration that the patient might be at  risk for infection with the SARS-CoV-2 virus that causes COVID-19. Institutional protocols and algorithms that pertain to the evaluation of patients at risk for COVID-19 are in a state of rapid change based on information released by regulatory bodies including the CDC and federal and state organizations. These policies and algorithms were followed during the patient's care.

## 2022-01-04 NOTE — ED Notes (Signed)
Vascular at bedside

## 2022-01-04 NOTE — Assessment & Plan Note (Signed)
Obtain urine electrolytes gently rehydrate and follow fluid status

## 2022-01-04 NOTE — Assessment & Plan Note (Signed)
Continue aspirin and Plavix

## 2022-01-05 DIAGNOSIS — I739 Peripheral vascular disease, unspecified: Secondary | ICD-10-CM | POA: Diagnosis present

## 2022-01-05 DIAGNOSIS — I872 Venous insufficiency (chronic) (peripheral): Secondary | ICD-10-CM | POA: Diagnosis present

## 2022-01-05 DIAGNOSIS — L03115 Cellulitis of right lower limb: Secondary | ICD-10-CM | POA: Diagnosis present

## 2022-01-05 DIAGNOSIS — E785 Hyperlipidemia, unspecified: Secondary | ICD-10-CM | POA: Diagnosis present

## 2022-01-05 DIAGNOSIS — Z79899 Other long term (current) drug therapy: Secondary | ICD-10-CM | POA: Diagnosis not present

## 2022-01-05 DIAGNOSIS — J040 Acute laryngitis: Secondary | ICD-10-CM | POA: Diagnosis present

## 2022-01-05 DIAGNOSIS — Z7982 Long term (current) use of aspirin: Secondary | ICD-10-CM | POA: Diagnosis not present

## 2022-01-05 DIAGNOSIS — N179 Acute kidney failure, unspecified: Secondary | ICD-10-CM | POA: Diagnosis present

## 2022-01-05 DIAGNOSIS — L97911 Non-pressure chronic ulcer of unspecified part of right lower leg limited to breakdown of skin: Secondary | ICD-10-CM | POA: Diagnosis present

## 2022-01-05 DIAGNOSIS — Z681 Body mass index (BMI) 19 or less, adult: Secondary | ICD-10-CM | POA: Diagnosis not present

## 2022-01-05 DIAGNOSIS — Z7902 Long term (current) use of antithrombotics/antiplatelets: Secondary | ICD-10-CM | POA: Diagnosis not present

## 2022-01-05 DIAGNOSIS — F17210 Nicotine dependence, cigarettes, uncomplicated: Secondary | ICD-10-CM | POA: Diagnosis present

## 2022-01-05 DIAGNOSIS — Z7951 Long term (current) use of inhaled steroids: Secondary | ICD-10-CM | POA: Diagnosis not present

## 2022-01-05 DIAGNOSIS — E440 Moderate protein-calorie malnutrition: Secondary | ICD-10-CM | POA: Diagnosis present

## 2022-01-05 DIAGNOSIS — Z22322 Carrier or suspected carrier of Methicillin resistant Staphylococcus aureus: Secondary | ICD-10-CM | POA: Diagnosis not present

## 2022-01-05 DIAGNOSIS — D638 Anemia in other chronic diseases classified elsewhere: Secondary | ICD-10-CM | POA: Diagnosis present

## 2022-01-05 DIAGNOSIS — Z66 Do not resuscitate: Secondary | ICD-10-CM | POA: Diagnosis present

## 2022-01-05 DIAGNOSIS — I2510 Atherosclerotic heart disease of native coronary artery without angina pectoris: Secondary | ICD-10-CM | POA: Diagnosis present

## 2022-01-05 DIAGNOSIS — Z79891 Long term (current) use of opiate analgesic: Secondary | ICD-10-CM | POA: Diagnosis not present

## 2022-01-05 DIAGNOSIS — E039 Hypothyroidism, unspecified: Secondary | ICD-10-CM | POA: Diagnosis present

## 2022-01-05 DIAGNOSIS — L0390 Cellulitis, unspecified: Secondary | ICD-10-CM | POA: Diagnosis present

## 2022-01-05 DIAGNOSIS — Z716 Tobacco abuse counseling: Secondary | ICD-10-CM | POA: Diagnosis not present

## 2022-01-05 DIAGNOSIS — Z79890 Hormone replacement therapy (postmenopausal): Secondary | ICD-10-CM | POA: Diagnosis not present

## 2022-01-05 LAB — COMPREHENSIVE METABOLIC PANEL
ALT: 9 U/L (ref 0–44)
AST: 13 U/L — ABNORMAL LOW (ref 15–41)
Albumin: 2.6 g/dL — ABNORMAL LOW (ref 3.5–5.0)
Alkaline Phosphatase: 98 U/L (ref 38–126)
Anion gap: 7 (ref 5–15)
BUN: 20 mg/dL (ref 8–23)
CO2: 23 mmol/L (ref 22–32)
Calcium: 8.5 mg/dL — ABNORMAL LOW (ref 8.9–10.3)
Chloride: 107 mmol/L (ref 98–111)
Creatinine, Ser: 0.94 mg/dL (ref 0.44–1.00)
GFR, Estimated: >60 mL/min (ref >60)
Glucose, Bld: 107 mg/dL — ABNORMAL HIGH (ref 70–99)
Potassium: 3.5 mmol/L (ref 3.5–5.1)
Sodium: 137 mmol/L (ref 135–145)
Total Bilirubin: 0.2 mg/dL — ABNORMAL LOW (ref 0.3–1.2)
Total Protein: 5.7 g/dL — ABNORMAL LOW (ref 6.5–8.1)

## 2022-01-05 LAB — CBC
HCT: 30 % — ABNORMAL LOW (ref 36.0–46.0)
Hemoglobin: 9.7 g/dL — ABNORMAL LOW (ref 12.0–15.0)
MCH: 30.9 pg (ref 26.0–34.0)
MCHC: 32.3 g/dL (ref 30.0–36.0)
MCV: 95.5 fL (ref 80.0–100.0)
Platelets: 245 10*3/uL (ref 150–400)
RBC: 3.14 MIL/uL — ABNORMAL LOW (ref 3.87–5.11)
RDW: 16.3 % — ABNORMAL HIGH (ref 11.5–15.5)
WBC: 8.3 10*3/uL (ref 4.0–10.5)
nRBC: 0 % (ref 0.0–0.2)

## 2022-01-05 LAB — IRON AND TIBC
Iron: 33 ug/dL (ref 28–170)
Saturation Ratios: 10 % — ABNORMAL LOW (ref 10.4–31.8)
TIBC: 319 ug/dL (ref 250–450)
UIBC: 286 ug/dL

## 2022-01-05 LAB — URINALYSIS, COMPLETE (UACMP) WITH MICROSCOPIC
Bacteria, UA: NONE SEEN
Bilirubin Urine: NEGATIVE
Glucose, UA: NEGATIVE mg/dL
Hgb urine dipstick: NEGATIVE
Ketones, ur: NEGATIVE mg/dL
Leukocytes,Ua: NEGATIVE
Nitrite: NEGATIVE
Protein, ur: NEGATIVE mg/dL
Specific Gravity, Urine: 1.011 (ref 1.005–1.030)
pH: 5 (ref 5.0–8.0)

## 2022-01-05 LAB — OSMOLALITY: Osmolality: 297 mOsm/kg — ABNORMAL HIGH (ref 275–295)

## 2022-01-05 LAB — PREALBUMIN: Prealbumin: 15.3 mg/dL — ABNORMAL LOW (ref 18–38)

## 2022-01-05 LAB — OSMOLALITY, URINE: Osmolality, Ur: 403 mOsm/kg (ref 300–900)

## 2022-01-05 LAB — CREATININE, URINE, RANDOM: Creatinine, Urine: 49.6 mg/dL

## 2022-01-05 LAB — FERRITIN: Ferritin: 14 ng/mL (ref 11–307)

## 2022-01-05 LAB — T4, FREE: Free T4: 1 ng/dL (ref 0.61–1.12)

## 2022-01-05 LAB — VITAMIN B12: Vitamin B-12: 1572 pg/mL — ABNORMAL HIGH (ref 180–914)

## 2022-01-05 MED ORDER — CLOPIDOGREL BISULFATE 75 MG PO TABS
75.0000 mg | ORAL_TABLET | Freq: Every day | ORAL | Status: DC
  Administered 2022-01-05 – 2022-01-10 (×6): 75 mg via ORAL
  Filled 2022-01-05 (×6): qty 1

## 2022-01-05 MED ORDER — SODIUM CHLORIDE 0.9 % IV SOLN
1.0000 g | INTRAVENOUS | Status: DC
  Administered 2022-01-05 – 2022-01-09 (×5): 1 g via INTRAVENOUS
  Filled 2022-01-05 (×5): qty 10

## 2022-01-05 MED ORDER — VANCOMYCIN HCL 1250 MG/250ML IV SOLN
1250.0000 mg | INTRAVENOUS | Status: DC
  Administered 2022-01-06 – 2022-01-08 (×3): 1250 mg via INTRAVENOUS
  Filled 2022-01-05 (×3): qty 250

## 2022-01-05 MED ORDER — ENOXAPARIN SODIUM 30 MG/0.3ML IJ SOSY
30.0000 mg | PREFILLED_SYRINGE | INTRAMUSCULAR | Status: DC
  Administered 2022-01-05: 30 mg via SUBCUTANEOUS
  Filled 2022-01-05: qty 0.3

## 2022-01-05 NOTE — Hospital Course (Addendum)
86 yo with hx tobacco abuse, PAD, hypothyroidism, dyslipidemia here with right lower extremity ulcers, redness, and pain.  She's been admitted with concern cellulitis and for wound care.  Vascular is following.  She's improved with IV antibiotics.  On the day of discharge, she was discharged from PT services due to goals met, her pain had improved, and her redness was also improved.  Family was concerned she needed rehab, but she was doing well with therapy and mobilizing well.  Unable to pay out of pocket for custodial care.  As she was thought safe for discharge, discharged with home health services and plan for PO abx, outpatient wound care, and follow outpatient.  See below for additional details

## 2022-01-05 NOTE — Consult Note (Signed)
[facility] Nurse Consult Note: Reason for Consult:Right LE chronic, nonhealing wounds. Dr. Rosalia Hammers has seen and provided guidance for wound care but requested [place] nurse input. Patient is admitted for wound care, systemic antibiotics. He further recommends downstream referral to the outpatient [facility] for follow up. I am in agreement with the POC and have added pressure injury prevention interventions. Wound type: mixed etiology, infectious Pressure Injury POA: N/A Measurement:To be obtained by Bedside RN with first dressing change today Wound UKG:URKY, moist shallow crater Drainage (amount, consistency, odor) scant serous Periwound:erythema Dressing procedure/placement/frequency: I agree with Dr. Nicole Cella POC for twice daily wound care using a ND cleanse, pat dry, application of antimicrobial nonadherent gauze (xeroform) to the wounds and topping with dry gauze, securing with Kerlix roll gauze and adding a 4-inch ACE bandage for mild compression. I have added Prevalon boots for floatation of heels, and placement of a sacral silicone foam dressing for PI prevention.  [place] nursing team will not follow, but will remain available to this patient, the nursing and medical teams.  Please re-consult if needed. Thanks, Maudie Flakes, MSN, RN, [place], Arther Abbott  Pager# [phone]

## 2022-01-05 NOTE — Assessment & Plan Note (Addendum)
Wound care recs Appreciate assistance Referral to wound care outpatient.  Plan for antibiotics and twice daily dressing changes with Xeroform, 4 x 4's, Kerlix, and Oleta Gunnoe 4 inch Ace for compression, Elevation and protective foot wear. F/U will be arranged with [facility]VS

## 2022-01-05 NOTE — Assessment & Plan Note (Addendum)
S/p stent to R SFA on 11/30/2021 by Dr. Trula Slade Seen by dr. Scot Dock, recommending IV abx and pain control as well as wound care follow up Follow ESR (50, elevated, but mildly when adjusted for age - (age+10)/2 ), CRP (wnl) Follow MRSA PCR positive Vanc/ceftriaxone - discharge with augmentin/doxycycline Appreciate wound care recs Will refer to wound care center If no improvement with pain, will follow MRI R ankle - she's continued to improve with less pain.  I don't think MRI necessary here with mildly elevated inflammatory markers, good ROM of ankle, and continued improvement.  Can consider outpatient if she worsens (discussed with [facility]).  Plan for antibiotics and twice daily dressing changes with Xeroform, 4 x 4's, Kerlix, and Leiland Mihelich 4 inch Ace for compression, Elevation and protective foot wear. F/U will be arranged with VVS

## 2022-01-05 NOTE — Evaluation (Signed)
Physical Therapy Evaluation Patient Details Name: Tamara Johnson MRN: [id number] DOB: Dec 22, 1935 Today's Date: 01/05/2022  History of Present Illness  Pt is an 86 yo F presenting with worsening R leg ulcers and pain due to  peripheral arterial disease and chronic venous insufficiency; 11/30/2021 R SFA stenting. PHI: Cellulitis, CAD, PAD, caudation, previous left femoral to below-knee popliteal artery bypass, tabacco use  Clinical Impression  Received pt semi-reclined in bed with sister present during session. Pt performed bed mobility mod I with HOB elevated and transfers with RW and supervision. Pt ambulated through room, into hallway with RW and min guard/close supervision. Pt's son works during the day but pt has another sister and neighbors/friends who can provide as much assist as needed. Recommend HHPT due to current strength/ROM and balance deficits. Acute PT to cont to follow.      Recommendations for follow up therapy are one component of a multi-disciplinary discharge planning process, led by the attending physician.  Recommendations may be updated based on patient status, additional functional criteria and insurance authorization.  Follow Up Recommendations Home health PT    Assistance Recommended at Discharge Intermittent Supervision/Assistance  Patient can return home with the following  A little help with walking and/or transfers;A little help with bathing/dressing/bathroom;Help with stairs or ramp for entrance;Assist for transportation    Equipment Recommendations Rolling walker (2 wheels)  Recommendations for Other Services       Functional Status Assessment Patient has had a recent decline in their functional status and demonstrates the ability to make significant improvements in function in a reasonable and predictable amount of time.     Precautions / Restrictions Precautions Precautions: Fall Required Braces or Orthoses: Other Brace Other Brace: R prevalon  boot Restrictions Weight Bearing Restrictions: No Other Position/Activity Restrictions: LE elevation recommended      Mobility  Bed Mobility Overal bed mobility: Modified Independent             General bed mobility comments: HOB elevated Patient Response: Cooperative  Transfers Overall transfer level: Needs assistance Equipment used: Rolling walker (2 wheels) Transfers: Sit to/from Stand Sit to Stand: Supervision           General transfer comment: requested therapist "not help"; stood from EOB with RW and close supervision    Ambulation/Gait Ambulation/Gait assistance: Min guard Gait Distance (Feet): 35 Feet Assistive device: Rolling walker (2 wheels) Gait Pattern/deviations: Step-through pattern, Decreased step length - right, Decreased step length - left, Decreased stance time - right, Decreased stride length, Decreased weight shift to right, Narrow base of support Gait velocity: decreased Gait velocity interpretation: <1.8 ft/sec, indicate of risk for recurrent falls   General Gait Details: ambulates with decreased weight shfiting to R side to avoid pain  Stairs            Wheelchair Mobility    Modified Rankin (Stroke Patients Only)       Balance Overall balance assessment: Needs assistance Sitting-balance support: No upper extremity supported, Feet supported Sitting balance-Leahy Scale: Normal     Standing balance support: Bilateral upper extremity supported, During functional activity (RW) Standing balance-Leahy Scale: Fair Standing balance comment: able to maintain static standing balance with supervision, required min guard for dynamic standing balance                             Pertinent Vitals/Pain Pain Assessment Pain Assessment: 0-10 Pain Score: 8  Pain Location: R foot Pain Descriptors /  Indicators: Burning, Discomfort, Guarding Pain Intervention(s): Limited activity within patient's tolerance, Monitored during  session, Premedicated before session, Repositioned    Home Living Family/patient expects to be discharged to:: Private residence Living Arrangements: Children (son) Available Help at Discharge: Family;Available PRN/intermittently (has neighbors and sisters that live nearby) Type of Home: House Home Access: Level entry       Home Layout: One level Home Equipment: Conservation officer, nature (2 wheels);Grab bars - tub/shower Additional Comments: has been using RW for past 4-5 weeks    Prior Function Prior Level of Function : Independent/Modified Independent       Physical Assist : ADLs (physical)     Mobility Comments: pt s HOH and has macular degeneration and cannot read ADLs Comments: Independent with ADL utilizing RW, required assistance with IADLs     Hand Dominance   Dominant Hand: Right    Extremity/Trunk Assessment   Upper Extremity Assessment Upper Extremity Assessment: Defer to OT evaluation    Lower Extremity Assessment Lower Extremity Assessment: Overall WFL for tasks assessed    Cervical / Trunk Assessment Cervical / Trunk Assessment: Normal  Communication   Communication: HOH  Cognition Arousal/Alertness: Awake/alert Behavior During Therapy: WFL for tasks assessed/performed Overall Cognitive Status: Within Functional Limits for tasks assessed                                 General Comments: sister present during session        General Comments General comments (skin integrity, edema, etc.): Spo2: 95% on RA after walking and ADL activity    Exercises     Assessment/Plan    PT Assessment Patient needs continued PT services  PT Problem List Decreased strength;Decreased range of motion;Decreased activity tolerance;Decreased balance;Decreased mobility;Decreased coordination;Pain       PT Treatment Interventions DME instruction;Gait training;Stair training;Therapeutic activities;Functional mobility training;Therapeutic exercise;Balance  training;Neuromuscular re-education;Patient/family education    PT Goals (Current goals can be found in the Care Plan section)  Acute Rehab PT Goals Patient Stated Goal: to go home PT Goal Formulation: With patient/family Time For Goal Achievement: 01/12/22 Potential to Achieve Goals: Good    Frequency Min 3X/week     Co-evaluation               AM-PAC PT "6 Clicks" Mobility  Outcome Measure Help needed turning from your back to your side while in a flat bed without using bedrails?: None Help needed moving from lying on your back to sitting on the side of a flat bed without using bedrails?: None Help needed moving to and from a bed to a chair (including a wheelchair)?: A Little Help needed standing up from a chair using your arms (e.g., wheelchair or bedside chair)?: A Little Help needed to walk in hospital room?: A Little Help needed climbing 3-5 steps with a railing? : A Little 6 Click Score: 20    End of Session Equipment Utilized During Treatment: Gait belt Activity Tolerance: Patient tolerated treatment well;Patient limited by pain Patient left: in bed;with call bell/phone within reach;with family/visitor present Nurse Communication: Mobility status PT Visit Diagnosis: Other abnormalities of gait and mobility (R26.89);Muscle weakness (generalized) (M62.81);Pain Pain - Right/Left: Right Pain - part of body: Leg    Time: 6270-3500 PT Time Calculation (min) (ACUTE ONLY): 20 min   Charges:   PT Evaluation $PT Eval Low Complexity: 1 Low          Becky Sax PT, DPT  Blenda Nicely 01/05/2022, 1:40 PM

## 2022-01-05 NOTE — Progress Notes (Signed)
Initial Nutrition Assessment  DOCUMENTATION CODES:  Non-severe (moderate) malnutrition in context of chronic illness, Underweight  INTERVENTION:  Liberalize diet to regular for maximum intake options Ensure Enlive po TID, each supplement provides 350 kcal and 20 grams of protein. MVI with minerals daily  NUTRITION DIAGNOSIS:  Moderate Malnutrition (in the context of chronic illness) related to  (chronic wounds) as evidenced by moderate fat depletion, severe muscle depletion.  GOAL:  Patient will meet greater than or equal to 90% of their needs  MONITOR:  PO intake, Supplement acceptance, Weight trends  REASON FOR ASSESSMENT:  Consult Assessment of nutrition requirement/status  ASSESSMENT:  Pt with hx of PAD, HLD, hypothyroidism presented to ED with worsening chronic wounds.  Pt resting in bed at the time of assessment, hard of hearing, but alert and awake. Family at bedside. Pt reports stable weight and good appetite prior to admission. States she eats 3 meals and a snack each day. However, significant muscle and fat depletions present and pt is underweight. Family at bedside states that intake has declined over time, used to drink ensure in the past but has stopped. PCP recommended pt resume. Will add supplements and liberalize diet to promote good oral intake and encourage weight gain.     Average Meal Intake: 5/31-6/1: 100% average intake x 2 recorded meals  Nutritionally Relevant Medications: Scheduled Meds:  atorvastatin  10 mg Oral Daily   levothyroxine  25 mcg Oral Q0600   Continuous Infusions:  cefTRIAXone (ROCEPHIN)  IV 1 g (01/05/22 2029)   vancomycin     Labs Reviewed  NUTRITION - FOCUSED PHYSICAL EXAM: Flowsheet Row Most Recent Value  Orbital Region Mild depletion  Upper Arm Region Moderate depletion  Thoracic and Lumbar Region Moderate depletion  Buccal Region Mild depletion  Temple Region Mild depletion  Clavicle Bone Region Moderate depletion  Clavicle  and Acromion Bone Region Moderate depletion  Scapular Bone Region Moderate depletion  Dorsal Hand Severe depletion  Patellar Region Severe depletion  Anterior Thigh Region Severe depletion  Posterior Calf Region Severe depletion  Edema (RD Assessment) None  Hair Reviewed  Eyes Reviewed  Mouth Reviewed  Skin Reviewed  Nails Reviewed    Diet Order:   Diet Order             Diet regular Room service appropriate? Yes; Fluid consistency: Thin  Diet effective now                   EDUCATION NEEDS:  Education needs have been addressed  Skin:  Skin Assessment: Reviewed RN Assessment (ulcers to the ankles)  Last BM:  5/31  Height:  Ht Readings from Last 1 Encounters:  01/05/22 '5\' 6"'$  (1.676 m)    Weight:  Wt Readings from Last 1 Encounters:  01/06/22 53.3 kg    Ideal Body Weight:  59.1 kg  BMI:  Body mass index is 18.97 kg/m.  Estimated Nutritional Needs:  Kcal:  1400-1600 kcal/d Protein:  70-85g/d Fluid:  >/=1.5L/d   Ranell Patrick, RD, LDN Clinical Dietitian RD pager # available in [facility]  After hours/weekend pager # available in [facility]

## 2022-01-05 NOTE — Assessment & Plan Note (Signed)
follow

## 2022-01-05 NOTE — Evaluation (Signed)
Occupational Therapy Evaluation Patient Details Name: Tamara Johnson MRN: [id number] DOB: [birth year] Today's Date: 01/05/2022   History of Present Illness Pt is an 86 yo F presenting with worsening R leg ulcers and pain due to  peripheral arterial disease and chronic venous insufficiency; 11/30/2021 R SFA stenting. PHI: Cellulitis, CAD, PAD, caudation, previous left femoral to below-knee popliteal artery bypass, tabacco use   Clinical Impression   Prior to surgery, pt did not require assistance for tasks. Pt now utilizes assistance from son and sister for ADLs and IADLs due to increased pain from surgery. Pt completed transfers with supervision to ensure safety and set up for ADLs at RW level. Increased pain and SOB was noted with activity, however SpO2 was 95% on RA. Acute OT to follow to pt to educate on energy conservation and strategies to maximize independence with ADLs and functional mobility to facilitate safe discharge home.    Recommendations for follow up therapy are one component of a multi-disciplinary discharge planning process, led by the attending physician.  Recommendations may be updated based on patient status, additional functional criteria and insurance authorization.   Follow Up Recommendations  No OT follow up    Assistance Recommended at Discharge Frequent or constant Supervision/Assistance  Patient can return home with the following A little help with walking and/or transfers;A little help with bathing/dressing/bathroom;Assistance with cooking/housework;Assist for transportation;Help with stairs or ramp for entrance    Functional Status Assessment  Patient has had a recent decline in their functional status and demonstrates the ability to make significant improvements in function in a reasonable and predictable amount of time.  Equipment Recommendations  BSC/3in1    Recommendations for Other Services       Precautions / Restrictions Precautions Precautions:  Fall Required Braces or Orthoses: Other Brace (B prevalon boots) Restrictions Weight Bearing Restrictions: No Other Position/Activity Restrictions: LE elevation recommended      Mobility Bed Mobility Overal bed mobility: Independent                  Transfers Overall transfer level: Needs assistance Equipment used: Rolling walker (2 wheels) Transfers: Sit to/from Stand Sit to Stand: Supervision                  Balance Overall balance assessment: Mild deficits observed, not formally tested                                         ADL either performed or assessed with clinical judgement   ADL Overall ADL's : Needs assistance/impaired     Grooming: Set up   Upper Body Bathing: Set up   Lower Body Bathing: Set up;Supervison/ safety   Upper Body Dressing : Set up   Lower Body Dressing: Supervision/safety   Toilet Transfer: Supervision/safety;Ambulation;Comfort height toilet;Rolling walker (2 wheels)   Toileting- Clothing Manipulation and Hygiene: Supervision/safety       Functional mobility during ADLs: Supervision/safety;Rolling walker (2 wheels) General ADL Comments: Increase SOB w/ increased activity, would benefit from energy conservation techniques     Vision Baseline Vision/History: 1 Wears glasses Ability to See in Adequate Light: 0 Adequate Patient Visual Report: No change from baseline       Perception     Praxis      Pertinent Vitals/Pain Pain Assessment Pain Assessment: 0-10 Pain Score: 10-Worst pain ever Pain Location: R leg Pain Descriptors / Indicators: Burning, Discomfort  Pain Intervention(s): Patient requesting pain meds-RN notified     Hand Dominance Right   Extremity/Trunk Assessment Upper Extremity Assessment Upper Extremity Assessment: Overall WFL for tasks assessed   Lower Extremity Assessment Lower Extremity Assessment: Overall WFL for tasks assessed   Cervical / Trunk Assessment Cervical /  Trunk Assessment: Normal   Communication Communication Communication: HOH   Cognition Arousal/Alertness: Awake/alert Behavior During Therapy: WFL for tasks assessed/performed Overall Cognitive Status: Within Functional Limits for tasks assessed                                       General Comments  Spo2: 95% on RA after walking and ADL activity    Exercises     Shoulder Instructions      Home Living Family/patient expects to be discharged to:: Private residence Living Arrangements: Children;Other relatives (Lives with son, sister assists when needed) Available Help at Discharge: Family Type of Home: House Home Access: Level entry     Home Layout: One level     Bathroom Shower/Tub: Occupational psychologist: Handicapped height Bathroom Accessibility: Yes How Accessible: Accessible via walker Home Equipment: Conservation officer, nature (2 wheels);Grab bars - tub/shower          Prior Functioning/Environment Prior Level of Function : Needs assist       Physical Assist : ADLs (physical)       ADLs Comments: Independent with ADL utilizing RW, required assistance with IADLs        OT Problem List: Decreased activity tolerance;Impaired balance (sitting and/or standing);Decreased safety awareness;Decreased knowledge of use of DME or AE;Decreased knowledge of precautions;Pain      OT Treatment/Interventions: Self-care/ADL training;Energy conservation;DME and/or AE instruction;Therapeutic exercise;Therapeutic activities;Patient/family education;Balance training    OT Goals(Current goals can be found in the care plan section) Acute Rehab OT Goals Patient Stated Goal: Go home and be independent with prior activities, decrease pain OT Goal Formulation: With patient Time For Goal Achievement: 01/19/22 Potential to Achieve Goals: Good  OT Frequency: Min 2X/week    Co-evaluation              AM-PAC OT "6 Clicks" Daily Activity     Outcome Measure  Help from another person eating meals?: None Help from another person taking care of personal grooming?: A Little Help from another person toileting, which includes using toliet, bedpan, or urinal?: A Little Help from another person bathing (including washing, rinsing, drying)?: A Little Help from another person to put on and taking off regular upper body clothing?: A Little Help from another person to put on and taking off regular lower body clothing?: A Little 6 Click Score: 19   End of Session Equipment Utilized During Treatment: Rolling walker (2 wheels);Gait belt Nurse Communication: Patient requests pain meds  Activity Tolerance: Patient tolerated treatment well Patient left: in bed;with call bell/phone within reach;with bed alarm set  OT Visit Diagnosis: Unsteadiness on feet (R26.81);Other abnormalities of gait and mobility (R26.89);Muscle weakness (generalized) (M62.81);Pain Pain - Right/Left: Right Pain - part of body: Leg                Time: 7017-7939 OT Time Calculation (min): 29 min Charges:  OT General Charges $OT Visit: 1 Visit  Nilda Simmer OTS   Nilda Simmer 01/05/2022, 11:23 AM

## 2022-01-05 NOTE — Progress Notes (Signed)
PROGRESS NOTE    Tamara Johnson  QIH:[id number] DOB: 1936/06/10 DOA: 01/04/2022 PCP: Bonnita Nasuti, MD  Chief Complaint  Patient presents with   Leg Pain    Brief Narrative:  86 yo with hx tobacco abuse, PAD, hypothyroidism, dyslipidemia here with right lower extremity ulcers, redness, and pain.  She's been admitted with concern cellulitis and for wound care.  Vascular is following.  See below for additional details    Assessment & Plan:   Principal Problem:   Cellulitis of right leg Active Problems:   Cellulitis   PAD (peripheral artery disease) (HCC)   Leg ulcer, right, limited to breakdown of skin ([place])   Anemia   AKI (acute kidney injury) ([place])   Hypothyroidism   Tobacco abuse   Assessment and Plan: * Cellulitis of right leg S/p stent to R SFA on 11/30/2021 by Dr. Trula Slade Seen by dr. Scot Dock, recommending IV abx and pain control as well as wound care follow up Follow ESR, CRP Follow MRSA PCR Continue vancomycin for now, will narrow cefepime to ceftriaxone Appreciate wound care recs    Leg ulcer, right, limited to breakdown of skin ([place]) Wound care recs Appreciate assistance  PAD (peripheral artery disease) ([place]) Aspirin, plavix, statin S/p stent to R SFA 11/30/2021 by Dr. Trula Slade  Anemia follow  AKI (acute kidney injury) ([place]) Mild, follow  Hypothyroidism synthroid  Tobacco abuse Encourage cessation     DVT prophylaxis: lovenox Code Status: DNR Family Communication: none Disposition:   Status is: Inpatient Remains inpatient appropriate because: continued pain control and IV abx   Consultants:  vascular  Procedures:  none  Antimicrobials:  Anti-infectives (From admission, onward)    Start     Dose/Rate Route Frequency Ordered Stop   01/06/22 2200  vancomycin (VANCOREADY) IVPB 1250 mg/250 mL        1,250 mg 166.7 mL/hr over 90 Minutes Intravenous Every 48 hours 01/05/22 0939     01/05/22 2000  cefTRIAXone (ROCEPHIN) 1 g in sodium  chloride 0.9 % 100 mL IVPB        1 g 200 mL/hr over 30 Minutes Intravenous Every 24 hours 01/05/22 1758     01/04/22 2015  vancomycin (VANCOCIN) IVPB 1000 mg/200 mL premix        1,000 mg 200 mL/hr over 60 Minutes Intravenous  Once 01/04/22 2012 01/04/22 2312   01/04/22 2015  ceFEPIme (MAXIPIME) 2 g in sodium chloride 0.9 % 100 mL IVPB  Status:  Discontinued        2 g 200 mL/hr over 30 Minutes Intravenous Every 12 hours 01/04/22 2012 01/05/22 1757   01/04/22 2011  vancomycin variable dose per unstable renal function (pharmacist dosing)  Status:  Discontinued         Does not apply See admin instructions 01/04/22 2012 01/05/22 0939   01/04/22 1900  ceFAZolin (ANCEF) IVPB 1 g/50 mL premix        1 g 100 mL/hr over 30 Minutes Intravenous  Once 01/04/22 1855 01/04/22 2036       Subjective: No new complaints  Objective: Vitals:   01/05/22 0821 01/05/22 0823 01/05/22 0900 01/05/22 1153  BP: (!) 229/193 (!) 155/69  (!) 145/69  Pulse: 83 78  84  Resp: 18   18  Temp: 98.7 F (37.1 C)   99 F (37.2 C)  TempSrc: Oral   Oral  SpO2: 97%   94%  Weight:  50.9 kg 50.1 kg   Height:   '5\' 6"'$  (1.676  m)     Intake/Output Summary (Last 24 hours) at 01/05/2022 1803 Last data filed at 01/05/2022 1253 Gross per 24 hour  Intake 669.21 ml  Output --  Net 669.21 ml   Filed Weights   01/05/22 0823 01/05/22 0900  Weight: 50.9 kg 50.1 kg    Examination:  General exam: Appears calm and comfortable  Respiratory system: unlabored Cardiovascular system: RRR Gastrointestinal system: Abdomen is nondistended, soft and nontender.  Central nervous system: Alert and oriented. No focal neurological deficits. Extremities: dressing intact to RLE, prafo boots    Data Reviewed: I have personally reviewed following labs and imaging studies  CBC: Recent Labs  Lab 01/04/22 1427 01/05/22 0248  WBC 8.1 8.3  NEUTROABS 5.5  --   HGB 10.7* 9.7*  HCT 36.5 30.0*  MCV 103.1* 95.5  PLT 271 245     Basic Metabolic Panel: Recent Labs  Lab 01/04/22 1427 01/04/22 2242 01/05/22 0248  NA 139  --  137  K 3.7  --  3.5  CL 107  --  107  CO2 23  --  23  GLUCOSE 103*  --  107*  BUN 23  --  20  CREATININE 1.15*  --  0.94  CALCIUM 8.8*  --  8.5*  MG  --  1.9  --   PHOS  --  3.1  --     GFR: Estimated Creatinine Clearance: 35.9 mL/min (by C-G formula based on SCr of 0.94 mg/dL).  Liver Function Tests: Recent Labs  Lab 01/04/22 2242 01/05/22 0248  AST 17 13*  ALT 12 9  ALKPHOS 96 98  BILITOT 0.3 0.2*  PROT 6.1* 5.7*  ALBUMIN 2.8* 2.6*    CBG: No results for input(s): GLUCAP in the last 168 hours.   No results found for this or any previous visit (from the past 240 hour(s)).       Radiology Studies: DG Ankle Complete Right  Result Date: 01/04/2022 CLINICAL DATA:  Right ankle ulcers.  Cellulitis EXAM: RIGHT ANKLE - COMPLETE 3+ VIEW COMPARISON:  None Available. FINDINGS: Diffuse osseous demineralization. Soft tissue wounds or ulcerations overlies the medial and lateral malleoli. No evidence of underlying bone erosion or periosteal elevation. No acute fracture or dislocation. IMPRESSION: 1. Soft tissue wounds or ulcerations overlies the medial and lateral malleoli. No radiographic evidence of underlying acute osteomyelitis. 2. Diffuse osseous demineralization. Electronically Signed   By: Davina Poke D.O.   On: 01/04/2022 15:07        Scheduled Meds:  aspirin EC  81 mg Oral Daily   atorvastatin  10 mg Oral Daily   buPROPion  150 mg Oral BID   clopidogrel  75 mg Oral Daily   levothyroxine  25 mcg Oral Q0600   rOPINIRole  0.5 mg Oral QHS   Continuous Infusions:  cefTRIAXone (ROCEPHIN)  IV     [START ON 01/06/2022] vancomycin       LOS: 0 days    Time spent: over 30 min    Fayrene Helper, MD [facility]   To contact the attending provider between 7A-7P or the covering provider during after hours 7P-7A, please log into the web site  [email or web address] and access using universal  password for that web site. If you do not have the password, please call the hospital operator.  01/05/2022, 6:03 PM

## 2022-01-05 NOTE — Progress Notes (Signed)
Pharmacy Antibiotic Note  Tamara Johnson is a 86 y.o. female for which pharmacy has been consulted for cefepime and vancomycin dosing for cellulitis.  SCr 0.94, trending down - baseline ~0.6 WBC 8.3; T 98.7 F  Plan: Continue Cefepime 2g q12hr Schedule Vancomycin '1250mg'$  IV q48h (eAUC ~507)    Goal AUC 400-550 F/u cultures, clinical course, WBC, fever, SCr De-escalate when able  Height: '5\' 6"'$  (167.6 cm) Weight: 50.1 kg (110 lb 7.2 oz) IBW/kg (Calculated) : 59.3  Temp (24hrs), Avg:98.6 F (37 C), Min:98.3 F (36.8 C), Max:98.7 F (37.1 C)  Recent Labs  Lab 01/04/22 1427 01/05/22 0248  WBC 8.1 8.3  CREATININE 1.15* 0.94     Estimated Creatinine Clearance: 35.9 mL/min (by C-G formula based on SCr of 0.94 mg/dL).    No Known Allergies  Antimicrobials this admission: Cefazolin given in ED x 1 5/30 Cefepime 5/30 >>  Vancomycin 5/30 >>   Thank you for allowing pharmacy to be a part of this patient's care.  Luisa Hart, PharmD, BCPS Clinical Pharmacist 01/05/2022 9:35 AM   Please refer to AMION for pharmacy phone number

## 2022-01-05 NOTE — Progress Notes (Signed)
VASCULAR SURGERY:  She is now on intravenous antibiotics (Maxipime and vancomycin).  I have written for twice daily dressing changes with Xeroform, 4 x 4's, Kerlix, and a 4 inch Ace for compression.  I have written for leg elevation.  She was on aspirin and a statin.  I have added Plavix.  Gae Gallop, MD 8:49 AM

## 2022-01-05 NOTE — TOC Initial Note (Signed)
Transition of Care [facility]) - Initial/Assessment Note    Patient Details  Name: Tamara Johnson MRN: [id number] Date of Birth: 1936/04/29  [facility] CM/SW Contact:    Carles Collet, RN Phone Number: 01/05/2022, 1:59 PM  Clinical Narrative:                Damaris Schooner w patient and her sister at the bedside. Discussed plans for discahrge and follow up for wound care. Patient states that she does not like going to the wound care center at [facility], she does not feel that they ave done a good job with her wounds, sister agrees. We discussed home health services as an alternative. Both agreed that would be preferable since she does not drive. They did not have a preference for a provider and Alvis Lemmings has accepted the referral for [facility] RN PT OT. We discussed DME and she would like 3/1 and RW for home use. Order placed and [employer] will deliver it toher room tomorrow. Discussed plan with her son Edd Arbour, who lives in the same house. He agreed with plan and is aware that Alvis Lemmings was given his name and number to call to arrange appointments at request of patient and her sister.  Please place [facility] RN PT OT order with face to face. Please include in DC instructions wound care instructions      Mast,RONNIE (Son)  [phone]   Expected Discharge Plan: [place] Barriers to Discharge: Continued Medical Work up   Patient Goals and CMS Choice Patient states their goals for this hospitalization and ongoing recovery are:: to go home CMS Medicare.gov Compare Post Acute Care list provided to:: Patient Choice offered to / list presented to : Patient  Expected Discharge Plan and Services Expected Discharge Plan: [place]   Discharge Planning Services: CM Consult Post Acute Care Choice: Home Health, Durable Medical Equipment Living arrangements for the past 2 months: Single Family Home                 DME Arranged: 3-N-1, Walker rolling DME Agency:  [employer] Date DME Agency Contacted: 01/05/22 Time DME Agency Contacted: [phone] Representative spoke with at DME Agency: Alecia Lemming HH Arranged: RN, PT, OT [place] Agency: [place] Date [place]: 01/05/22 Time [place]: 1359 Representative spoke with at [place]: [place] Arrangements/Services Living arrangements for the past 2 months: [place] Lives with:: Adult Children   Do you feel safe going back to the place where you live?: Yes          Current home services: DME    Activities of Daily Living Home Assistive Devices/Equipment: Environmental consultant (specify type) ADL Screening (condition at time of admission) Patient's cognitive ability adequate to safely complete daily activities?: Yes Is the patient deaf or have difficulty hearing?: Yes Does the patient have difficulty seeing, even when wearing glasses/contacts?: Yes Does the patient have difficulty concentrating, remembering, or making decisions?: No Patient able to express need for assistance with ADLs?: Yes Does the patient have difficulty dressing or bathing?: No Independently performs ADLs?: Yes (appropriate for developmental age) Does the patient have difficulty walking or climbing stairs?: Yes Weakness of Legs: Right Weakness of Arms/Hands: None  Permission Sought/Granted                  Emotional Assessment              Admission diagnosis:  Cellulitis [  L03.90] Right leg pain [M79.604] PAD (peripheral artery disease) (HCC) [I73.9] Cellulitis of right lower extremity [X43.568] Wound of right lower extremity, initial encounter [S81.801A] Patient Active Problem List   Diagnosis Date Noted   Cellulitis 01/05/2022   Cellulitis of right leg 01/04/2022   PAD (peripheral artery disease) ([place]) 01/04/2022   Leg ulcer, right, limited to breakdown of skin ([place]) 01/04/2022   Anemia 01/04/2022   AKI (acute kidney injury) ([place]) 01/04/2022   Hypothyroidism  01/04/2022   Tobacco abuse 01/04/2022   PCP:  Bonnita Nasuti, MD Pharmacy:   [facility] [place],  - [address] AT [place] [place] [address] Phone: [phone] Fax: [phone]     Social Determinants of Health (SDOH) Interventions    Readmission Risk Interventions     View : No data to display.

## 2022-01-05 NOTE — Assessment & Plan Note (Signed)
synthroid °

## 2022-01-05 NOTE — Assessment & Plan Note (Signed)
Encourage cessation. °

## 2022-01-05 NOTE — Assessment & Plan Note (Addendum)
Aspirin, plavix, statin S/p stent to R SFA 11/30/2021 by Dr. Trula Slade 5/15 resting ABI within normal range on R Follow with vascular as scheduled

## 2022-01-05 NOTE — Assessment & Plan Note (Addendum)
resolved 

## 2022-01-05 NOTE — Progress Notes (Signed)
OT Note Addendum    01/05/22 1500  OT Visit Information  Last OT Received On 01/05/22  OT Time Calculation  OT Start Time (ACUTE ONLY) 0936  OT Stop Time (ACUTE ONLY) 1005  OT Time Calculation (min) 29 min  OT General Charges  $OT Visit 1 Visit  OT Evaluation  $OT Eval Low Complexity 1 Low   Tito Ausmus, OT/L   Acute OT Clinical Specialist Acute Rehabilitation Services Pager [phone] Office [phone]

## 2022-01-06 ENCOUNTER — Ambulatory Visit: Payer: Medicare Other | Admitting: Vascular Surgery

## 2022-01-06 DIAGNOSIS — L03115 Cellulitis of right lower limb: Secondary | ICD-10-CM | POA: Diagnosis not present

## 2022-01-06 DIAGNOSIS — I739 Peripheral vascular disease, unspecified: Secondary | ICD-10-CM

## 2022-01-06 DIAGNOSIS — R490 Dysphonia: Secondary | ICD-10-CM

## 2022-01-06 LAB — CBC WITH DIFFERENTIAL/PLATELET
Abs Immature Granulocytes: 0.03 10*3/uL (ref 0.00–0.07)
Basophils Absolute: 0 10*3/uL (ref 0.0–0.1)
Basophils Relative: 1 %
Eosinophils Absolute: 0.1 10*3/uL (ref 0.0–0.5)
Eosinophils Relative: 2 %
HCT: 30.1 % — ABNORMAL LOW (ref 36.0–46.0)
Hemoglobin: 9.2 g/dL — ABNORMAL LOW (ref 12.0–15.0)
Immature Granulocytes: 1 %
Lymphocytes Relative: 26 %
Lymphs Abs: 1.6 10*3/uL (ref 0.7–4.0)
MCH: 29.8 pg (ref 26.0–34.0)
MCHC: 30.6 g/dL (ref 30.0–36.0)
MCV: 97.4 fL (ref 80.0–100.0)
Monocytes Absolute: 1.1 10*3/uL — ABNORMAL HIGH (ref 0.1–1.0)
Monocytes Relative: 17 %
Neutro Abs: 3.4 10*3/uL (ref 1.7–7.7)
Neutrophils Relative %: 53 %
Platelets: 244 10*3/uL (ref 150–400)
RBC: 3.09 MIL/uL — ABNORMAL LOW (ref 3.87–5.11)
RDW: 16.3 % — ABNORMAL HIGH (ref 11.5–15.5)
WBC: 6.3 10*3/uL (ref 4.0–10.5)
nRBC: 0 % (ref 0.0–0.2)

## 2022-01-06 LAB — COMPREHENSIVE METABOLIC PANEL
ALT: 10 U/L (ref 0–44)
AST: 15 U/L (ref 15–41)
Albumin: 2.5 g/dL — ABNORMAL LOW (ref 3.5–5.0)
Alkaline Phosphatase: 91 U/L (ref 38–126)
Anion gap: 7 (ref 5–15)
BUN: 18 mg/dL (ref 8–23)
CO2: 22 mmol/L (ref 22–32)
Calcium: 8.4 mg/dL — ABNORMAL LOW (ref 8.9–10.3)
Chloride: 110 mmol/L (ref 98–111)
Creatinine, Ser: 0.91 mg/dL (ref 0.44–1.00)
GFR, Estimated: >60 mL/min (ref >60)
Glucose, Bld: 111 mg/dL — ABNORMAL HIGH (ref 70–99)
Potassium: 3.5 mmol/L (ref 3.5–5.1)
Sodium: 139 mmol/L (ref 135–145)
Total Bilirubin: 0.4 mg/dL (ref 0.3–1.2)
Total Protein: 5.4 g/dL — ABNORMAL LOW (ref 6.5–8.1)

## 2022-01-06 LAB — PHOSPHORUS: Phosphorus: 3.9 mg/dL (ref 2.5–4.6)

## 2022-01-06 LAB — T3: T3, Total: 92 ng/dL (ref 71–180)

## 2022-01-06 LAB — C-REACTIVE PROTEIN: CRP: 0.8 mg/dL (ref <1.0)

## 2022-01-06 LAB — MAGNESIUM: Magnesium: 2 mg/dL (ref 1.7–2.4)

## 2022-01-06 LAB — SEDIMENTATION RATE: Sed Rate: 50 mm/hr — ABNORMAL HIGH (ref 0–22)

## 2022-01-06 MED ORDER — ENSURE ENLIVE PO LIQD
237.0000 mL | Freq: Three times a day (TID) | ORAL | Status: DC
  Administered 2022-01-06 – 2022-01-10 (×9): 237 mL via ORAL

## 2022-01-06 MED ORDER — ENOXAPARIN SODIUM 40 MG/0.4ML IJ SOSY
40.0000 mg | PREFILLED_SYRINGE | INTRAMUSCULAR | Status: DC
  Administered 2022-01-06 – 2022-01-10 (×5): 40 mg via SUBCUTANEOUS
  Filled 2022-01-06 (×5): qty 0.4

## 2022-01-06 MED ORDER — ADULT MULTIVITAMIN W/MINERALS CH
1.0000 | ORAL_TABLET | Freq: Every day | ORAL | Status: DC
  Administered 2022-01-06 – 2022-01-10 (×5): 1 via ORAL
  Filled 2022-01-06 (×5): qty 1

## 2022-01-06 NOTE — Progress Notes (Signed)
Physical Therapy Treatment Patient Details Name: Tamara Johnson MRN: [id number] DOB: 09/22/1935 Today's Date: 01/06/2022   History of Present Illness Pt is an 86 yo F presenting 01/04/22 with worsening R leg ulcers and pain due to  peripheral arterial disease and chronic venous insufficiency; 11/30/2021 R SFA stenting. PHI: Cellulitis, CAD, PAD, caudation, previous left femoral to below-knee popliteal artery bypass, tabacco use    PT Comments    Pt is making good progress with PT, ambulating up to ~140 ft with a RW at supervision level today. Pt displays fairly steady gait, even carrying the RW at times. However, pt appeared nervous about trying to ambulate without UE support and thus opted to not try without RW quite yet. Educated pt's family member or friend that pt could use a stool to sit on when cooking for energy conservation and pain management. Completed session with lower extremity exercises and interventions to reduce her R leg edema. Will continue to follow acutely. Current recommendations remain appropriate.     Recommendations for follow up therapy are one component of a multi-disciplinary discharge planning process, led by the attending physician.  Recommendations may be updated based on patient status, additional functional criteria and insurance authorization.  Follow Up Recommendations  Home health PT     Assistance Recommended at Discharge Intermittent Supervision/Assistance  Patient can return home with the following A little help with bathing/dressing/bathroom;Help with stairs or ramp for entrance;Assist for transportation;Assistance with cooking/housework   Equipment Recommendations  Rolling walker (2 wheels)    Recommendations for Other Services       Precautions / Restrictions Precautions Precautions: Fall Required Braces or Orthoses: Other Brace Other Brace: R prevalon boot Restrictions Weight Bearing Restrictions: No Other Position/Activity Restrictions: LE  elevation recommended     Mobility  Bed Mobility Overal bed mobility: Modified Independent             General bed mobility comments: Pt able to perform all bed mobility aspects without assistance, HOB elevated    Transfers Overall transfer level: Needs assistance Equipment used: Rolling walker (2 wheels) Transfers: Sit to/from Stand Sit to Stand: Supervision           General transfer comment: Supervision for safety, no LOB    Ambulation/Gait Ambulation/Gait assistance: Supervision Gait Distance (Feet): 140 Feet Assistive device: Rolling walker (2 wheels) Gait Pattern/deviations: Step-through pattern, Decreased step length - right, Decreased step length - left, Decreased stride length, Narrow base of support Gait velocity: decreased Gait velocity interpretation: <1.8 ft/sec, indicate of risk for recurrent falls   General Gait Details: Pt with slow, fiarly steady gait, intermittently picking up and carrying RW. Cued pt to attempt without RW but pt reported she still wanted to use it for now. No LOB, supervision for safety   Stairs             Wheelchair Mobility    Modified Rankin (Stroke Patients Only)       Balance Overall balance assessment: Needs assistance Sitting-balance support: No upper extremity supported, Feet supported Sitting balance-Leahy Scale: Normal     Standing balance support: Bilateral upper extremity supported, No upper extremity supported, During functional activity Standing balance-Leahy Scale: Fair Standing balance comment: Able to stand without UE support but benefits from UE support with mobility or standing leg exercises.                            Cognition Arousal/Alertness: Awake/alert Behavior During Therapy: [facility]  for tasks assessed/performed Overall Cognitive Status: Within Functional Limits for tasks assessed                                          Exercises General Exercises - Lower  Extremity Hip ABduction/ADduction: AROM, Strengthening, Both, 10 reps, Standing (with RW) Hip Flexion/Marching: AROM, Strengthening, Both, 5 reps, Standing (with RW) Other Exercises Other Exercises: sit <> stand 10x from EOB using bil UEs Other Exercises: provided very gentle massage to reduce edema in R leg with R leg elevated with pt in supine end of session    General Comments        Pertinent Vitals/Pain Pain Assessment Pain Assessment: Faces Faces Pain Scale: Hurts little more Pain Location: R foot Pain Descriptors / Indicators: Discomfort, Guarding Pain Intervention(s): Limited activity within patient's tolerance, Monitored during session, Repositioned    Home Living                          Prior Function            PT Goals (current goals can now be found in the care plan section) Acute Rehab PT Goals Patient Stated Goal: to improve PT Goal Formulation: With patient/family Time For Goal Achievement: 01/12/22 Potential to Achieve Goals: Good Progress towards PT goals: Progressing toward goals    Frequency    Min 3X/week      PT Plan Current plan remains appropriate    Co-evaluation              AM-PAC PT "6 Clicks" Mobility   Outcome Measure  Help needed turning from your back to your side while in a flat bed without using bedrails?: None Help needed moving from lying on your back to sitting on the side of a flat bed without using bedrails?: None Help needed moving to and from a bed to a chair (including a wheelchair)?: A Little Help needed standing up from a chair using your arms (e.g., wheelchair or bedside chair)?: A Little Help needed to walk in hospital room?: A Little Help needed climbing 3-5 steps with a railing? : A Little 6 Click Score: 20    End of Session   Activity Tolerance: Patient tolerated treatment well Patient left: in bed;with call bell/phone within reach;with bed alarm set;with family/visitor present Nurse  Communication: Mobility status PT Visit Diagnosis: Other abnormalities of gait and mobility (R26.89);Muscle weakness (generalized) (M62.81);Pain;Difficulty in walking, not elsewhere classified (R26.2) Pain - Right/Left: Right Pain - part of body: Leg     Time: 1425-1450 PT Time Calculation (min) (ACUTE ONLY): 25 min  Charges:  $Gait Training: 8-22 mins $Therapeutic Exercise: 8-22 mins                     Moishe Spice, PT, DPT Acute Rehabilitation Services  Pager: [phone] Office: [place] 01/06/2022, 2:57 PM

## 2022-01-06 NOTE — Progress Notes (Signed)
PROGRESS NOTE    Tamara Johnson  HWE:[id number] DOB: Aug 23, 1935 DOA: 01/04/2022 PCP: Bonnita Nasuti, MD  Chief Complaint  Patient presents with   Leg Pain    Brief Narrative:  86 yo with hx tobacco abuse, PAD, hypothyroidism, dyslipidemia here with right lower extremity ulcers, redness, and pain.  She's been admitted with concern cellulitis and for wound care.  Vascular is following.  See below for additional details    Assessment & Plan:   Principal Problem:   Cellulitis of right leg Active Problems:   Cellulitis   PAD (peripheral artery disease) (HCC)   Leg ulcer, right, limited to breakdown of skin (HCC)   Hoarseness of voice   Anemia   AKI (acute kidney injury) ([place])   Hypothyroidism   Tobacco abuse   Assessment and Plan: * Cellulitis of right leg S/p stent to R SFA on 11/30/2021 by Dr. Trula Slade Seen by dr. Scot Dock, recommending IV abx and pain control as well as wound care follow up Follow ESR (50, elevated), CRP (wnl) Follow MRSA PCR pending collection vanc/ceftriaxone Appreciate wound care recs  Plan for antibiotics and twice daily dressing changes with Xeroform, 4 x 4's, Kerlix, and Iman Reinertsen 4 inch Ace for compression, Elevation and protective foot wear. F/U will be arranged with [facility]VS  Hoarseness of voice About 10 days or so ? Laryngitis, follow for now May need outpatient ENT follow up  Leg ulcer, right, limited to breakdown of skin ([place]) Wound care recs Appreciate assistance  Plan for antibiotics and twice daily dressing changes with Xeroform, 4 x 4's, Kerlix, and [place]averly Chavarria 4 inch Ace for compression, Elevation and protective foot wear. F/U will be arranged with VVS  PAD (peripheral artery disease) (HCC) Aspirin, plavix, statin S/p stent to R SFA 11/30/2021 by Dr. Trula Slade  Anemia follow  AKI (acute kidney injury) ([place]) Mild, follow  Hypothyroidism synthroid  Tobacco abuse Encourage cessation     DVT prophylaxis: lovenox Code Status: DNR Family  Communication: none Disposition:   Status is: Inpatient Remains inpatient appropriate because: continued pain control and IV abx   Consultants:  vascular  Procedures:  none  Antimicrobials:  Anti-infectives (From admission, onward)    Start     Dose/Rate Route Frequency Ordered Stop   01/06/22 2200  vancomycin (VANCOREADY) IVPB 1250 mg/250 mL        1,250 mg 166.7 mL/hr over 90 Minutes Intravenous Every 48 hours 01/05/22 0939     01/05/22 2000  cefTRIAXone (ROCEPHIN) 1 g in sodium chloride 0.9 % 100 mL IVPB        1 g 200 mL/hr over 30 Minutes Intravenous Every 24 hours 01/05/22 1758     01/04/22 2015  vancomycin (VANCOCIN) IVPB 1000 mg/200 mL premix        1,000 mg 200 mL/hr over 60 Minutes Intravenous  Once 01/04/22 2012 01/04/22 2312   01/04/22 2015  ceFEPIme (MAXIPIME) 2 g in sodium chloride 0.9 % 100 mL IVPB  Status:  Discontinued        2 g 200 mL/hr over 30 Minutes Intravenous Every 12 hours 01/04/22 2012 01/05/22 1757   01/04/22 2011  vancomycin variable dose per unstable renal function (pharmacist dosing)  Status:  Discontinued         Does not apply See admin instructions 01/04/22 2012 01/05/22 0939   01/04/22 1900  ceFAZolin (ANCEF) IVPB 1 g/50 mL premix        1 g 100 mL/hr over 30 Minutes Intravenous  Once 01/04/22  1855 01/04/22 2036       Subjective: Continued pain Hoarse throat  Objective: Vitals:   01/05/22 2042 01/06/22 0514 01/06/22 0805 01/06/22 0919  BP: (!) 152/68 136/69 (!) 154/65   Pulse: 80 73 79   Resp: (!) 22 18 18    Temp: 98.8 F (37.1 C) 98.4 F (36.9 C) 98.7 F (37.1 C)   TempSrc: Oral Oral Oral   SpO2: 93% 99% 92%   Weight:    53.3 kg  Height:        Intake/Output Summary (Last 24 hours) at 01/06/2022 1359 Last data filed at 01/06/2022 0805 Gross per 24 hour  Intake 320 ml  Output 600 ml  Net -280 ml   Filed Weights   01/05/22 0823 01/05/22 0900 01/06/22 0919  Weight: 50.9 kg 50.1 kg 53.3 kg    Examination:  General: No  acute distress. Cardiovascular: RRR Lungs: unlabored Abdomen: Soft, nontender, nondistended Neurological: Alert and oriented 3. Moves all extremities 4 with equal strength. Cranial nerves II through XII grossly intact. Extremities: dressing to RLE    Data Reviewed: I have personally reviewed following labs and imaging studies  CBC: Recent Labs  Lab 01/04/22 1427 01/05/22 0248 01/06/22 0121  WBC 8.1 8.3 6.3  NEUTROABS 5.5  --  3.4  HGB 10.7* 9.7* 9.2*  HCT 36.5 30.0* 30.1*  MCV 103.1* 95.5 97.4  PLT 271 245 428    Basic Metabolic Panel: Recent Labs  Lab 01/04/22 1427 01/04/22 2242 01/05/22 0248 01/06/22 0121  NA 139  --  137 139  K 3.7  --  3.5 3.5  CL 107  --  107 110  CO2 23  --  23 22  GLUCOSE 103*  --  107* 111*  BUN 23  --  20 18  CREATININE 1.15*  --  0.94 0.91  CALCIUM 8.8*  --  8.5* 8.4*  MG  --  1.9  --  2.0  PHOS  --  3.1  --  3.9    GFR: Estimated Creatinine Clearance: 39.4 mL/min (by C-G formula based on SCr of 0.91 mg/dL).  Liver Function Tests: Recent Labs  Lab 01/04/22 2242 01/05/22 0248 01/06/22 0121  AST 17 13* 15  ALT 12 9 10   ALKPHOS 96 98 91  BILITOT 0.3 0.2* 0.4  PROT 6.1* 5.7* 5.4*  ALBUMIN 2.8* 2.6* 2.5*    CBG: No results for input(s): GLUCAP in the last 168 hours.   No results found for this or any previous visit (from the past 240 hour(s)).       Radiology Studies: DG Ankle Complete Right  Result Date: 01/04/2022 CLINICAL DATA:  Right ankle ulcers.  Cellulitis EXAM: RIGHT ANKLE - COMPLETE 3+ VIEW COMPARISON:  None Available. FINDINGS: Diffuse osseous demineralization. Soft tissue wounds or ulcerations overlies the medial and lateral malleoli. No evidence of underlying bone erosion or periosteal elevation. No acute fracture or dislocation. IMPRESSION: 1. Soft tissue wounds or ulcerations overlies the medial and lateral malleoli. No radiographic evidence of underlying acute osteomyelitis. 2. Diffuse osseous  demineralization. Electronically Signed   By: Davina Poke D.O.   On: 01/04/2022 15:07        Scheduled Meds:  aspirin EC  81 mg Oral Daily   atorvastatin  10 mg Oral Daily   buPROPion  150 mg Oral BID   clopidogrel  75 mg Oral Daily   enoxaparin (LOVENOX) injection  30 mg Subcutaneous Q24H   levothyroxine  25 mcg Oral Q0600   rOPINIRole  0.5 mg Oral QHS   Continuous Infusions:  cefTRIAXone (ROCEPHIN)  IV 1 g (01/05/22 2029)   vancomycin       LOS: 1 day    Time spent: over 30 min    Fayrene Helper, MD [facility]   To contact the attending provider between 7A-7P or the covering provider during after hours 7P-7A, please log into the web site [email or web address] and access using universal [place] password for that web site. If you do not have the password, please call the hospital operator.  01/06/2022, 1:59 PM

## 2022-01-06 NOTE — Assessment & Plan Note (Addendum)
About 10 days or so ? Laryngitis, follow for now Would recommend ENT follow up outpatient esp given hx tobacco use/smoking

## 2022-01-06 NOTE — Progress Notes (Addendum)
[facility] [place]  Subjective  - comfotable, states she has been elevating her leg.   Objective 136/69 73 98.4 F (36.9 C) (Oral) 18 99%  Intake/Output Summary (Last 24 hours) at 01/06/2022 0714 Last data filed at 01/06/2022 0305 Gross per 24 hour  Intake 440 ml  Output 600 ml  Net -160 ml   Right heel floating boot in place to alleviate heel pressure, dressing in place with Xeroform, 4 x 4's, Kerlix, and a 4 inch Ace for compression.     Assessment/Planning: Right foot wounds medial and lateral malleolus with erythema with known  peripheral arterial disease and chronic venous insufficiency.  On 11/30/2021 she underwent successful endovascular revascularization.   Right superficial femoral artery occlusion, successfully recanalized and stented with overlapping 6 mm Elluvia stents.  There is three-vessel runoff by Dr. Trula Slade.  Plan for antibiotics and twice daily dressing changes with Xeroform, 4 x 4's, Kerlix, and a 4 inch Ace for compression, Elevation and protective foot wear. F/U will be arranged with VVS   Roxy Horseman 01/06/2022 7:14 AM --  Laboratory Lab Results: Recent Labs    01/05/22 0248 01/06/22 0121  WBC 8.3 6.3  HGB 9.7* 9.2*  HCT 30.0* 30.1*  PLT 245 244   BMET Recent Labs    01/05/22 0248 01/06/22 0121  NA 137 139  K 3.5 3.5  CL 107 110  CO2 23 22  GLUCOSE 107* 111*  BUN 20 18  CREATININE 0.94 0.91  CALCIUM 8.5* 8.4*    COAG Lab Results  Component Value Date   INR 1.0 12/17/2008   No results found for: PTT   I agree with the above.  Stable from vascular perspective.  Needs Abx and wound care and outpatient wound center appointment.  Will sign off.  Wells Brabahnm

## 2022-01-06 NOTE — Progress Notes (Signed)
Mobility Specialist Progress Note   01/06/22 1834  Mobility  Activity Ambulated with assistance in hallway  Level of Assistance Standby assist, set-up cues, supervision of patient - no hands on  Assistive Device Front wheel walker  Distance Ambulated (ft) 196 ft  Activity Response Tolerated well  $Mobility charge 1 Mobility   Received pt in bed claiming to a have slight hearing problem but having no current physical complaints. Asymptomatic throughout ambulation w/ no faults, returned back to EOB w/ call bell in reach and all needs met.  Holland Falling Mobility Specialist Phone Number [phone]

## 2022-01-07 DIAGNOSIS — E440 Moderate protein-calorie malnutrition: Secondary | ICD-10-CM

## 2022-01-07 LAB — CBC WITH DIFFERENTIAL/PLATELET
Abs Immature Granulocytes: 0.02 10*3/uL (ref 0.00–0.07)
Basophils Absolute: 0 10*3/uL (ref 0.0–0.1)
Basophils Relative: 0 %
Eosinophils Absolute: 0 10*3/uL (ref 0.0–0.5)
Eosinophils Relative: 0 %
HCT: 30.6 % — ABNORMAL LOW (ref 36.0–46.0)
Hemoglobin: 9.6 g/dL — ABNORMAL LOW (ref 12.0–15.0)
Immature Granulocytes: 0 %
Lymphocytes Relative: 22 %
Lymphs Abs: 1.3 10*3/uL (ref 0.7–4.0)
MCH: 30.3 pg (ref 26.0–34.0)
MCHC: 31.4 g/dL (ref 30.0–36.0)
MCV: 96.5 fL (ref 80.0–100.0)
Monocytes Absolute: 0.9 10*3/uL (ref 0.1–1.0)
Monocytes Relative: 15 %
Neutro Abs: 3.7 10*3/uL (ref 1.7–7.7)
Neutrophils Relative %: 63 %
Platelets: 227 10*3/uL (ref 150–400)
RBC: 3.17 MIL/uL — ABNORMAL LOW (ref 3.87–5.11)
RDW: 16.2 % — ABNORMAL HIGH (ref 11.5–15.5)
WBC: 6 10*3/uL (ref 4.0–10.5)
nRBC: 0 % (ref 0.0–0.2)

## 2022-01-07 LAB — COMPREHENSIVE METABOLIC PANEL
ALT: 9 U/L (ref 0–44)
AST: 16 U/L (ref 15–41)
Albumin: 2.6 g/dL — ABNORMAL LOW (ref 3.5–5.0)
Alkaline Phosphatase: 86 U/L (ref 38–126)
Anion gap: 7 (ref 5–15)
BUN: 19 mg/dL (ref 8–23)
CO2: 24 mmol/L (ref 22–32)
Calcium: 8.5 mg/dL — ABNORMAL LOW (ref 8.9–10.3)
Chloride: 109 mmol/L (ref 98–111)
Creatinine, Ser: 0.71 mg/dL (ref 0.44–1.00)
GFR, Estimated: >60 mL/min (ref >60)
Glucose, Bld: 101 mg/dL — ABNORMAL HIGH (ref 70–99)
Potassium: 3.8 mmol/L (ref 3.5–5.1)
Sodium: 140 mmol/L (ref 135–145)
Total Bilirubin: 0.4 mg/dL (ref 0.3–1.2)
Total Protein: 5.8 g/dL — ABNORMAL LOW (ref 6.5–8.1)

## 2022-01-07 LAB — PHOSPHORUS: Phosphorus: 3.8 mg/dL (ref 2.5–4.6)

## 2022-01-07 LAB — MAGNESIUM: Magnesium: 2 mg/dL (ref 1.7–2.4)

## 2022-01-07 NOTE — Plan of Care (Signed)
  Problem: Activity: Goal: Risk for activity intolerance will decrease Outcome: Not Progressing   Problem: Pain Managment: Goal: General experience of comfort will improve Outcome: Not Progressing   Problem: Skin Integrity: Goal: Risk for impaired skin integrity will decrease Outcome: Not Progressing   

## 2022-01-07 NOTE — Assessment & Plan Note (Signed)
RD

## 2022-01-07 NOTE — Progress Notes (Signed)
Physical Therapy Treatment Patient Details Name: Tamara Johnson MRN: [id number] DOB: [birth year] Today's Date: 01/07/2022   History of Present Illness Pt is an 86 yo F presenting 01/04/22 with worsening R leg ulcers and pain due to  peripheral arterial disease and chronic venous insufficiency; 11/30/2021 R SFA stenting. PHI: Cellulitis, CAD, PAD, caudication, previous left femoral to below-knee popliteal artery bypass, tobacco use    PT Comments    Pt demonstrates mod I bed mobility. Supervision provided for transfers and ambulation 200' with RW. Pt reports RLE pain limiting her mobility and ability to stand for longer periods of time. Pt returned to bed at end of session for LE elevation. Prevalon boot applied R.     Recommendations for follow up therapy are one component of a multi-disciplinary discharge planning process, led by the attending physician.  Recommendations may be updated based on patient status, additional functional criteria and insurance authorization.  Follow Up Recommendations  Home health PT     Assistance Recommended at Discharge Intermittent Supervision/Assistance  Patient can return home with the following A little help with bathing/dressing/bathroom;Help with stairs or ramp for entrance;Assist for transportation;Assistance with cooking/housework   Equipment Recommendations  Rolling walker (2 wheels)    Recommendations for Other Services       Precautions / Restrictions Precautions Precautions: Fall Required Braces or Orthoses: Other Brace Other Brace: R prevalon boot in bed Restrictions Weight Bearing Restrictions: No Other Position/Activity Restrictions: LE elevation recommended     Mobility  Bed Mobility Overal bed mobility: Modified Independent             General bed mobility comments: HOB elevated    Transfers Overall transfer level: Needs assistance Equipment used: Rolling walker (2 wheels) Transfers: Sit to/from Stand Sit to Stand:  Supervision           General transfer comment: good technique, increased time to power up    Ambulation/Gait Ambulation/Gait assistance: Supervision Gait Distance (Feet): 200 Feet Assistive device: Rolling walker (2 wheels) Gait Pattern/deviations: Step-through pattern, Decreased stride length, Antalgic, Decreased weight shift to right Gait velocity: decreased Gait velocity interpretation: <1.31 ft/sec, indicative of household ambulator   General Gait Details: steady gait with RW   Stairs             Wheelchair Mobility    Modified Rankin (Stroke Patients Only)       Balance Overall balance assessment: Needs assistance Sitting-balance support: No upper extremity supported, Feet supported Sitting balance-Leahy Scale: Normal     Standing balance support: Bilateral upper extremity supported, No upper extremity supported, During functional activity Standing balance-Leahy Scale: Fair Standing balance comment: static stand without UE support, RW for amb primarily to offset WB R foot due to pain                            Cognition Arousal/Alertness: Awake/alert Behavior During Therapy: WFL for tasks assessed/performed Overall Cognitive Status: Within Functional Limits for tasks assessed                                 General Comments: sister present during session        Exercises      General Comments General comments (skin integrity, edema, etc.): VSS on RA      Pertinent Vitals/Pain Pain Assessment Pain Assessment: Faces Faces Pain Scale: Hurts little more Pain Location: R foot Pain  Descriptors / Indicators: Discomfort, Guarding Pain Intervention(s): Monitored during session, Limited activity within patient's tolerance, Repositioned    Home Living                          Prior Function            PT Goals (current goals can now be found in the care plan section) Acute Rehab PT Goals Patient Stated Goal:  decrease pain, wound healing Progress towards PT goals: Progressing toward goals    Frequency    Min 3X/week      PT Plan Current plan remains appropriate    Co-evaluation              AM-PAC PT "6 Clicks" Mobility   Outcome Measure  Help needed turning from your back to your side while in a flat bed without using bedrails?: None Help needed moving from lying on your back to sitting on the side of a flat bed without using bedrails?: None Help needed moving to and from a bed to a chair (including a wheelchair)?: A Little Help needed standing up from a chair using your arms (e.g., wheelchair or bedside chair)?: A Little Help needed to walk in hospital room?: A Little Help needed climbing 3-5 steps with a railing? : A Little 6 Click Score: 20    End of Session Equipment Utilized During Treatment: Gait belt Activity Tolerance: Patient tolerated treatment well Patient left: in bed;with call bell/phone within reach Nurse Communication: Mobility status PT Visit Diagnosis: Other abnormalities of gait and mobility (R26.89);Muscle weakness (generalized) (M62.81);Pain;Difficulty in walking, not elsewhere classified (R26.2) Pain - Right/Left: Right Pain - part of body: Leg     Time: 9323-5573 PT Time Calculation (min) (ACUTE ONLY): 20 min  Charges:  $Gait Training: 8-22 mins                     Lorrin Goodell, PT  Office # [phone] Pager [phone]    Tamara Johnson 01/07/2022, 11:14 AM

## 2022-01-07 NOTE — Progress Notes (Signed)
Occupational Therapy Treatment Patient Details Name: Tamara Johnson MRN: [id number] DOB: [birth year] Today's Date: 01/07/2022   History of present illness Pt is an 86 yo F presenting 01/04/22 with worsening R leg ulcers and pain due to  peripheral arterial disease and chronic venous insufficiency; 11/30/2021 R SFA stenting. PHI: Cellulitis, CAD, PAD, caudication, previous left femoral to below-knee popliteal artery bypass, tobacco use   OT comments  Pt. Seen for skilled OT session. Provided handouts for energy conservation and fall prevention.  Demonstration and review of recommendations.  Pt. Receptive to education and also able to verbalize current modifications she utilizes for safety at home.  Pt. Also reports she is doing well with smoking cessation at the hospital and hopeful to cont. Once home.    At end of session pts. Sister enters the room Pamala Hurry I think).  I introduced myself and reviewed what the pt. And I had just completed during our session.  The sister then states in a firm tone " you know she cant be by herself dont you and I live an hour away she has no one".  I stated what I had been told that the son lives there.  She states "but he works"  I then explained that documentation and pt. Report that she can handle simple meals in a.m. and son cooks meals in the evening.  She then made a face and body language indicating that was not the case.  She then states "you know shes not getting any better you know that right, have you seen the pictures do you know any of this someone said she should go for some additional therapy before home".  I provided reassurance as she appeared upset.  I explained that I had completed chart review prior to my arrival.  I explained that I am not final and only deciding factor to where her sister goes after the hospital d/c. I reviewed that I was here today to provide the ot treatment as I had done.  I reviewed that the pt. Was able to explain safety strategies and  described a home that was equipped for her needs including grab bars, walk in showers, area rugs removed ect.  Pt. Did not speak at all during her sisters comments or concerns.  Reviewed conversation with pts. RN.  Per PT documentation and pt. Reports of home set up it conflicts with what the pts. Sister is saying the pts. Needs are.  Need to determine with pt. And family involved (son included) the preferred/needed plans for home.       Recommendations for follow up therapy are one component of a multi-disciplinary discharge planning process, led by the attending physician.  Recommendations may be updated based on patient status, additional functional criteria and insurance authorization.    Follow Up Recommendations  No OT follow up    Assistance Recommended at Discharge Frequent or constant Supervision/Assistance  Patient can return home with the following  A little help with walking and/or transfers;A little help with bathing/dressing/bathroom;Assistance with cooking/housework;Assist for transportation;Help with stairs or ramp for entrance   Equipment Recommendations  BSC/3in1    Recommendations for Other Services      Precautions / Restrictions Precautions Precautions: Fall Required Braces or Orthoses: Other Brace Other Brace: R prevalon boot in bed Restrictions Other Position/Activity Restrictions: LE elevation recommended       Mobility Bed Mobility                    Transfers  Balance                                           ADL either performed or assessed with clinical judgement   ADL Overall ADL's : Needs assistance/impaired                                       General ADL Comments: focus of session was energy conservation and fall prevetion.  pt. had just returned to bed from PT and indicated not wanting to get back out.  provided handouts and demonstrated examples along with verbal  review of recommendations.  pt. able to verbalize understanding along with describe her current modifications.  reports house is also equipped with grab bars, walk in showers and other modifications as husband was "disabled" per her report prior to passing away. reports simple meal prep for b.fast and lunch and son cooks dinner for her and him.    Extremity/Trunk Assessment              Vision       Perception     Praxis      Cognition Arousal/Alertness: Awake/alert Behavior During Therapy: WFL for tasks assessed/performed Overall Cognitive Status: Within Functional Limits for tasks assessed                                 General Comments: sister present at end of session        Exercises      Shoulder Instructions       General Comments Pt. Has a small dog zippy that she loves and takes care of.  Has several grand and great grand children that she is also excited to see.  Once just had preschool graduation last night.      Pertinent Vitals/ Pain       Pain Assessment Pain Assessment: Faces Faces Pain Scale: Hurts a little bit Pain Location: R foot Pain Descriptors / Indicators: Aching Pain Intervention(s): Limited activity within patient's tolerance, Monitored during session  Home Living                                          Prior Functioning/Environment              Frequency  Min 2X/week        Progress Toward Goals  OT Goals(current goals can now be found in the care plan section)  Progress towards OT goals: Progressing toward goals     Plan Discharge plan remains appropriate    Co-evaluation                 AM-PAC OT "6 Clicks" Daily Activity     Outcome Measure   Help from another person eating meals?: None Help from another person taking care of personal grooming?: A Little Help from another person toileting, which includes using toliet, bedpan, or urinal?: A Little Help from another person  bathing (including washing, rinsing, drying)?: A Little Help from another person to put on and taking off regular upper body clothing?: A Little Help from another person to put on and taking off regular  lower body clothing?: A Little 6 Click Score: 19    End of Session    OT Visit Diagnosis: Unsteadiness on feet (R26.81);Other abnormalities of gait and mobility (R26.89);Muscle weakness (generalized) (M62.81);Pain Pain - Right/Left: Right Pain - part of body: Leg   Activity Tolerance Patient tolerated treatment well   Patient Left in bed;with call bell/phone within reach;with family/visitor present   Nurse Communication Other (comment) (spoke with rn regarding conversation i had with pts. sister at end of session.)        Time: 1128-1147 OT Time Calculation (min): 19 min  Charges: OT General Charges $OT Visit: 1 Visit OT Treatments $Self Care/Home Management : 8-22 mins  Sonia Baller, COTA/L Acute Rehabilitation [phone]   Tanya Nones 01/07/2022, 1:08 PM

## 2022-01-07 NOTE — Progress Notes (Signed)
PROGRESS NOTE    Tamara Johnson  JKD:[id number] DOB: 01/23/1936 DOA: 01/04/2022 PCP: Tamara Nasuti, MD  Chief Complaint  Patient presents with   Leg Pain    Brief Narrative:  86 yo with hx tobacco abuse, PAD, hypothyroidism, dyslipidemia here with right lower extremity ulcers, redness, and pain.  She's been admitted with concern cellulitis and for wound care.  Vascular is following.  See below for additional details    Assessment & Plan:   Principal Problem:   Cellulitis of right leg Active Problems:   Cellulitis   PAD (peripheral artery disease) (HCC)   Leg ulcer, right, limited to breakdown of skin (HCC)   Hoarseness of voice   Anemia   AKI (acute kidney injury) ([place])   Hypothyroidism   Tobacco abuse   Protein-calorie malnutrition, moderate (HCC)   Assessment and Plan: * Cellulitis of right leg S/p stent to R SFA on 11/30/2021 by Dr. Trula Johnson Seen by dr. Scot Johnson, recommending IV abx and pain control as well as wound care follow up Follow ESR (50, elevated), CRP (wnl) Follow MRSA PCR pending collection vanc/ceftriaxone Appreciate wound care recs If no improvement with pain, will follow MRI R ankle   Plan for antibiotics and twice daily dressing changes with Xeroform, 4 x 4's, Kerlix, and Tamara Johnson 4 inch Ace for compression, Elevation and protective foot wear. F/U will be arranged with [facility]  Hoarseness of voice About 10 days or so ? Laryngitis, follow for now May need outpatient ENT follow up  Leg ulcer, right, limited to breakdown of skin ([place]) Wound care recs Appreciate assistance  Plan for antibiotics and twice daily dressing changes with Xeroform, 4 x 4's, Kerlix, and Tamara Johnson 4 inch Ace for compression, Elevation and protective foot wear. F/U will be arranged with [facility]  PAD (peripheral artery disease) (HCC) Aspirin, plavix, statin S/p stent to R SFA 11/30/2021 by Dr. Trula Johnson  Anemia follow  AKI (acute kidney injury) ([place]) Mild,  follow  Hypothyroidism synthroid  Tobacco abuse Encourage cessation  Protein-calorie malnutrition, moderate ([place]) RD     DVT prophylaxis: lovenox Code Status: DNR Family Communication: none Disposition:   Status is: Inpatient Remains inpatient appropriate because: continued pain control and IV abx   Consultants:  vascular  Procedures:  none  Antimicrobials:  Anti-infectives (From admission, onward)    Start     Dose/Rate Route Frequency Ordered Stop   01/06/22 2200  vancomycin (VANCOREADY) IVPB 1250 mg/250 mL        1,250 mg 166.7 mL/hr over 90 Minutes Intravenous Every 48 hours 01/05/22 0939     01/05/22 2000  cefTRIAXone (ROCEPHIN) 1 g in sodium chloride 0.9 % 100 mL IVPB        1 g 200 mL/hr over 30 Minutes Intravenous Every 24 hours 01/05/22 1758     01/04/22 2015  vancomycin (VANCOCIN) IVPB 1000 mg/200 mL premix        1,000 mg 200 mL/hr over 60 Minutes Intravenous  Once 01/04/22 2012 01/04/22 2312   01/04/22 2015  ceFEPIme (MAXIPIME) 2 g in sodium chloride 0.9 % 100 mL IVPB  Status:  Discontinued        2 g 200 mL/hr over 30 Minutes Intravenous Every 12 hours 01/04/22 2012 01/05/22 1757   01/04/22 2011  vancomycin variable dose per unstable renal function (pharmacist dosing)  Status:  Discontinued         Does not apply See admin instructions 01/04/22 2012 01/05/22 0939   01/04/22 1900  ceFAZolin (ANCEF)  IVPB 1 g/50 mL premix        1 g 100 mL/hr over 30 Minutes Intravenous  Once 01/04/22 1855 01/04/22 2036       Subjective: Pain about the same  Objective: Vitals:   01/06/22 2039 01/07/22 0457 01/07/22 0500 01/07/22 0740  BP: 137/64 139/64  136/62  Pulse: 70 73  71  Resp: 20 18  18   Temp: 98.6 F (37 C) 98.6 F (37 C)  98.2 F (36.8 C)  TempSrc: Oral Oral  Oral  SpO2: 100% 90%  94%  Weight:   51.5 kg   Height:        Intake/Output Summary (Last 24 hours) at 01/07/2022 1500 Last data filed at 01/07/2022 0945 Gross per 24 hour  Intake 629.82  ml  Output --  Net 629.82 ml   Filed Weights   01/05/22 0900 01/06/22 0919 01/07/22 0500  Weight: 50.1 kg 53.3 kg 51.5 kg    Examination:  General: No acute distress. Cardiovascular:RRR Lungs: unlabored Abdomen: Soft, nontender, nondistended  Neurological: Alert and oriented 3. Moves all extremities 4 with equal strength. Cranial nerves II through XII grossly intact. Extremities: RLE with intact dressing  Data Reviewed: I have personally reviewed following labs and imaging studies  CBC: Recent Labs  Lab 01/04/22 1427 01/05/22 0248 01/06/22 0121 01/07/22 0619  WBC 8.1 8.3 6.3 6.0  NEUTROABS 5.5  --  3.4 3.7  HGB 10.7* 9.7* 9.2* 9.6*  HCT 36.5 30.0* 30.1* 30.6*  MCV 103.1* 95.5 97.4 96.5  PLT 271 245 244 956    Basic Metabolic Panel: Recent Labs  Lab 01/04/22 1427 01/04/22 2242 01/05/22 0248 01/06/22 0121 01/07/22 0619  NA 139  --  137 139 140  K 3.7  --  3.5 3.5 3.8  CL 107  --  107 110 109  CO2 23  --  23 22 24   GLUCOSE 103*  --  107* 111* 101*  BUN 23  --  20 18 19   CREATININE 1.15*  --  0.94 0.91 0.71  CALCIUM 8.8*  --  8.5* 8.4* 8.5*  MG  --  1.9  --  2.0 2.0  PHOS  --  3.1  --  3.9 3.8    GFR: Estimated Creatinine Clearance: 43.3 mL/min (by C-G formula based on SCr of 0.71 mg/dL).  Liver Function Tests: Recent Labs  Lab 01/04/22 2242 01/05/22 0248 01/06/22 0121 01/07/22 0619  AST 17 13* 15 16  ALT 12 9 10 9   ALKPHOS 96 98 91 86  BILITOT 0.3 0.2* 0.4 0.4  PROT 6.1* 5.7* 5.4* 5.8*  ALBUMIN 2.8* 2.6* 2.5* 2.6*    CBG: No results for input(s): GLUCAP in the last 168 hours.   No results found for this or any previous visit (from the past 240 hour(s)).       Radiology Studies: No results found.      Scheduled Meds:  aspirin EC  81 mg Oral Daily   atorvastatin  10 mg Oral Daily   buPROPion  150 mg Oral BID   clopidogrel  75 mg Oral Daily   enoxaparin (LOVENOX) injection  40 mg Subcutaneous Q24H   feeding supplement  237 mL  Oral TID BM   levothyroxine  25 mcg Oral Q0600   multivitamin with minerals  1 tablet Oral Daily   rOPINIRole  0.5 mg Oral QHS   Continuous Infusions:  cefTRIAXone (ROCEPHIN)  IV 1 g (01/06/22 1942)   vancomycin 1,250 mg (01/06/22 2140)  LOS: 2 days    Time spent: over 30 min    Tamara Helper, MD [facility]   To contact the attending provider between 7A-7P or the covering provider during after hours 7P-7A, please log into the web site [email or web address] and access using universal [place] password for that web site. If you do not have the password, please call the hospital operator.  01/07/2022, 3:00 PM

## 2022-01-08 LAB — CBC WITH DIFFERENTIAL/PLATELET
Abs Immature Granulocytes: 0.01 10*3/uL (ref 0.00–0.07)
Basophils Absolute: 0 10*3/uL (ref 0.0–0.1)
Basophils Relative: 1 %
Eosinophils Absolute: 0 10*3/uL (ref 0.0–0.5)
Eosinophils Relative: 0 %
HCT: 28.9 % — ABNORMAL LOW (ref 36.0–46.0)
Hemoglobin: 8.9 g/dL — ABNORMAL LOW (ref 12.0–15.0)
Immature Granulocytes: 0 %
Lymphocytes Relative: 25 %
Lymphs Abs: 1.5 10*3/uL (ref 0.7–4.0)
MCH: 30.1 pg (ref 26.0–34.0)
MCHC: 30.8 g/dL (ref 30.0–36.0)
MCV: 97.6 fL (ref 80.0–100.0)
Monocytes Absolute: 1 10*3/uL (ref 0.1–1.0)
Monocytes Relative: 16 %
Neutro Abs: 3.6 10*3/uL (ref 1.7–7.7)
Neutrophils Relative %: 58 %
Platelets: 238 10*3/uL (ref 150–400)
RBC: 2.96 MIL/uL — ABNORMAL LOW (ref 3.87–5.11)
RDW: 16.2 % — ABNORMAL HIGH (ref 11.5–15.5)
WBC: 6.2 10*3/uL (ref 4.0–10.5)
nRBC: 0 % (ref 0.0–0.2)

## 2022-01-08 LAB — COMPREHENSIVE METABOLIC PANEL
ALT: 11 U/L (ref 0–44)
AST: 14 U/L — ABNORMAL LOW (ref 15–41)
Albumin: 2.5 g/dL — ABNORMAL LOW (ref 3.5–5.0)
Alkaline Phosphatase: 87 U/L (ref 38–126)
Anion gap: 7 (ref 5–15)
BUN: 20 mg/dL (ref 8–23)
CO2: 25 mmol/L (ref 22–32)
Calcium: 8.4 mg/dL — ABNORMAL LOW (ref 8.9–10.3)
Chloride: 108 mmol/L (ref 98–111)
Creatinine, Ser: 0.67 mg/dL (ref 0.44–1.00)
GFR, Estimated: >60 mL/min (ref >60)
Glucose, Bld: 114 mg/dL — ABNORMAL HIGH (ref 70–99)
Potassium: 3.7 mmol/L (ref 3.5–5.1)
Sodium: 140 mmol/L (ref 135–145)
Total Bilirubin: 0.5 mg/dL (ref 0.3–1.2)
Total Protein: 5.5 g/dL — ABNORMAL LOW (ref 6.5–8.1)

## 2022-01-08 LAB — MRSA NEXT GEN BY PCR, NASAL: MRSA by PCR Next Gen: DETECTED — AB

## 2022-01-08 LAB — PHOSPHORUS: Phosphorus: 4.2 mg/dL (ref 2.5–4.6)

## 2022-01-08 LAB — MAGNESIUM: Magnesium: 2 mg/dL (ref 1.7–2.4)

## 2022-01-08 NOTE — Progress Notes (Signed)
Mobility Specialist Progress Note   01/08/22 1830  Mobility  Activity Ambulated with assistance in hallway  Level of Assistance Standby assist, set-up cues, supervision of patient - no hands on  Assistive Device Front wheel walker  Distance Ambulated (ft) 240 ft  Activity Response Tolerated well  $Mobility charge 1 Mobility   Received pt on EOB having no complaints and agreeable to mobility. Asymptomatic throughout ambulation, returned back to EOB w/ call bell in reach and all needs met.  Holland Falling Mobility Specialist Phone Number [phone]

## 2022-01-08 NOTE — Plan of Care (Signed)
  Problem: Nutrition: Goal: Adequate nutrition will be maintained Outcome: Progressing   Problem: Pain Managment: Goal: General experience of comfort will improve Outcome: Progressing   Problem: Safety: Goal: Ability to remain free from injury will improve Outcome: Progressing   

## 2022-01-08 NOTE — Progress Notes (Signed)
PROGRESS NOTE    Tamara Johnson  TWS:[id number] DOB: July 28, 1936 DOA: 01/04/2022 PCP: Tamara Nasuti, MD  Chief Complaint  Patient presents with   Leg Pain    Brief Narrative:  86 yo with hx tobacco abuse, PAD, hypothyroidism, dyslipidemia here with right lower extremity ulcers, redness, and pain.  She's been admitted with concern cellulitis and for wound care.  Vascular is following.  See below for additional details    Assessment & Plan:   Principal Problem:   Cellulitis of right leg Active Problems:   Cellulitis   PAD (peripheral artery disease) (Tamara Johnson)   Leg ulcer, right, limited to breakdown of skin (Tamara Johnson)   Hoarseness of voice   Anemia   AKI (acute kidney injury) (Tamara Johnson)   Hypothyroidism   Tobacco abuse   Protein-calorie malnutrition, moderate (Tamara Johnson)   Assessment and Plan: * Cellulitis of right leg S/p stent to R SFA on 11/30/2021 by Dr. Trula Johnson Seen by dr. Scot Johnson, recommending IV abx and pain control as well as wound care follow up Follow ESR (50, elevated), CRP (wnl) Follow MRSA PCR pending collection vanc/ceftriaxone Appreciate wound care recs If no improvement with pain, will follow MRI R ankle - will hold off, seems to be improving  Plan for antibiotics and twice daily dressing changes with Xeroform, 4 x 4's, Kerlix, and Tamara Johnson 4 inch Ace for compression, Elevation and protective foot wear. F/U will be arranged with [facility]  Hoarseness of voice About 10 days or so ? Laryngitis, follow for now May need outpatient ENT follow up  Leg ulcer, right, limited to breakdown of skin (Tamara Johnson) Wound care recs Appreciate assistance  Plan for antibiotics and twice daily dressing changes with Xeroform, 4 x 4's, Kerlix, and Tamara Johnson 4 inch Ace for compression, Elevation and protective foot wear. F/U will be arranged with [facility]  PAD (peripheral artery disease) (Tamara Johnson) Aspirin, plavix, statin S/p stent to R SFA 11/30/2021 by Dr. Trula Johnson  Anemia follow  AKI (acute kidney injury) (Tamara Johnson) Mild,  follow  Hypothyroidism synthroid  Tobacco abuse Encourage cessation  Protein-calorie malnutrition, moderate ([place]) RD     DVT prophylaxis: lovenox Code Status: DNR Family Communication: none Disposition:   Status is: Inpatient Remains inpatient appropriate because: continued pain control and IV abx   Consultants:  vascular  Procedures:  none  Antimicrobials:  Anti-infectives (From admission, onward)    Start     Dose/Rate Route Frequency Ordered Stop   01/06/22 2200  vancomycin (VANCOREADY) IVPB 1250 mg/250 mL        1,250 mg 166.7 mL/hr over 90 Minutes Intravenous Every 48 hours 01/05/22 0939     01/05/22 2000  cefTRIAXone (ROCEPHIN) 1 g in sodium chloride 0.9 % 100 mL IVPB        1 g 200 mL/hr over 30 Minutes Intravenous Every 24 hours 01/05/22 1758     01/04/22 2015  vancomycin (VANCOCIN) IVPB 1000 mg/200 mL premix        1,000 mg 200 mL/hr over 60 Minutes Intravenous  Once 01/04/22 2012 01/04/22 2312   01/04/22 2015  ceFEPIme (MAXIPIME) 2 g in sodium chloride 0.9 % 100 mL IVPB  Status:  Discontinued        2 g 200 mL/hr over 30 Minutes Intravenous Every 12 hours 01/04/22 2012 01/05/22 1757   01/04/22 2011  vancomycin variable dose per unstable renal function (pharmacist dosing)  Status:  Discontinued         Does not apply See admin instructions 01/04/22 2012 01/05/22 0017  01/04/22 1900  ceFAZolin (ANCEF) IVPB 1 g/50 mL premix        1 g 100 mL/hr over 30 Minutes Intravenous  Once 01/04/22 1855 01/04/22 2036       Subjective: Feeling Tamara Johnson little better  Objective: Vitals:   01/07/22 0740 01/07/22 1547 01/07/22 2259 01/08/22 0540  BP: 136/62 (!) 129/59 (!) 123/51 (!) 152/53  Pulse: 71 83 73 (!) 59  Resp: 18 18 18 17   Temp: 98.2 F (36.8 C)  98.7 F (37.1 C) 98 F (36.7 C)  TempSrc: Oral  Oral Oral  SpO2: 94% 90% 98% 95%  Weight:      Height:       No intake or output data in the 24 hours ending 01/08/22 1636  Filed Weights   01/05/22 0900  01/06/22 0919 01/07/22 0500  Weight: 50.1 kg 53.3 kg 51.5 kg    Examination:  General: No acute distress. Cardiovascular: RRR Lungs: unlabored Abdomen: Soft, nontender, nondistended  Neurological: Alert and oriented 3. Moves all extremities 4. Cranial nerves II through XII grossly intact. Skin: redness and pain to RLE appears to be improving   Data Reviewed: I have personally reviewed following labs and imaging studies  CBC: Recent Labs  Lab 01/04/22 1427 01/05/22 0248 01/06/22 0121 01/07/22 0619 01/08/22 0131  WBC 8.1 8.3 6.3 6.0 6.2  NEUTROABS 5.5  --  3.4 3.7 3.6  HGB 10.7* 9.7* 9.2* 9.6* 8.9*  HCT 36.5 30.0* 30.1* 30.6* 28.9*  MCV 103.1* 95.5 97.4 96.5 97.6  PLT 271 245 244 227 607    Basic Metabolic Panel: Recent Labs  Lab 01/04/22 1427 01/04/22 2242 01/05/22 0248 01/06/22 0121 01/07/22 0619 01/08/22 0131  NA 139  --  137 139 140 140  K 3.7  --  3.5 3.5 3.8 3.7  CL 107  --  107 110 109 108  CO2 23  --  23 22 24 25   GLUCOSE 103*  --  107* 111* 101* 114*  BUN 23  --  20 18 19 20   CREATININE 1.15*  --  0.94 0.91 0.71 0.67  CALCIUM 8.8*  --  8.5* 8.4* 8.5* 8.4*  MG  --  1.9  --  2.0 2.0 2.0  PHOS  --  3.1  --  3.9 3.8 4.2    GFR: Estimated Creatinine Clearance: 43.3 mL/min (by C-G formula based on SCr of 0.67 mg/dL).  Liver Function Tests: Recent Labs  Lab 01/04/22 2242 01/05/22 0248 01/06/22 0121 01/07/22 0619 01/08/22 0131  AST 17 13* 15 16 14*  ALT 12 9 10 9 11   ALKPHOS 96 98 91 86 87  BILITOT 0.3 0.2* 0.4 0.4 0.5  PROT 6.1* 5.7* 5.4* 5.8* 5.5*  ALBUMIN 2.8* 2.6* 2.5* 2.6* 2.5*    CBG: No results for input(s): GLUCAP in the last 168 hours.   No results found for this or any previous visit (from the past 240 hour(s)).       Radiology Studies: No results found.      Scheduled Meds:  aspirin EC  81 mg Oral Daily   atorvastatin  10 mg Oral Daily   buPROPion  150 mg Oral BID   clopidogrel  75 mg Oral Daily   enoxaparin  (LOVENOX) injection  40 mg Subcutaneous Q24H   feeding supplement  237 mL Oral TID BM   levothyroxine  25 mcg Oral Q0600   multivitamin with minerals  1 tablet Oral Daily   rOPINIRole  0.5 mg Oral QHS  Continuous Infusions:  cefTRIAXone (ROCEPHIN)  IV 1 g (01/07/22 2044)   vancomycin 1,250 mg (01/07/22 2139)     LOS: 3 days    Time spent: over 30 min    Fayrene Helper, MD [facility]   To contact the attending provider between 7A-7P or the covering provider during after hours 7P-7A, please log into the web site [email or web address] and access using universal [place] password for that web site. If you do not have the password, please call the hospital operator.  01/08/2022, 4:36 PM

## 2022-01-08 NOTE — Progress Notes (Addendum)
Pharmacy Antibiotic Note- follow-up  Tamara Johnson is a 86 y.o. female for which pharmacy has been consulted for vancomycin dosing for cellulitis.  Plan: Ceftriaxone 1 gram iv q24h Continue Vancomycin '1250mg'$  IV q48h (ordered for 7 days via consult) (eAUC ~507) Goal AUC 400-550 F/u cultures, clinical course, WBC, fever, SCr De-escalate when able  Height: '5\' 6"'$  (167.6 cm) Weight: 51.5 kg (113 lb 8.6 oz) IBW/kg (Calculated) : 59.3  Temp (24hrs), Avg:98.4 F (36.9 C), Min:98 F (36.7 C), Max:98.7 F (37.1 C)  Recent Labs  Lab 01/04/22 1427 01/05/22 0248 01/06/22 0121 01/07/22 0619 01/08/22 0131  WBC 8.1 8.3 6.3 6.0 6.2  CREATININE 1.15* 0.94 0.91 0.71 0.67     Estimated Creatinine Clearance: 43.3 mL/min (by C-G formula based on SCr of 0.67 mg/dL).    No Known Allergies  Antimicrobials this admission: Cefazolin given in ED x 1 5/30 Cefepime 5/30 >> 5/31 Vancomycin 5/30 >> Ceftriaxone 5/31 >>  Microbiology 6/3- MRSA PCR- pending   Thank you for allowing pharmacy to be a part of this patient's care.  Vaughan Basta BS, PharmD, BCPS Clinical Pharmacist 01/08/2022 8:59 AM  Contact: [phone] after 3 PM  "Be curious, not judgmental..." -Jamal Maes

## 2022-01-08 NOTE — Plan of Care (Signed)

## 2022-01-09 NOTE — NC FL2 (Signed)
[place] MEDICAID FL2 LEVEL OF CARE SCREENING TOOL     IDENTIFICATION  Patient Name: Tamara Johnson Birthdate: [birth year] Sex: female Admission Date (Current Location): 01/04/2022  [facility] [place] [facility] [place] Number:  Herbalist and Address:  [facility] [place], [place] [address], [place], [place] [address]      Provider Number: [id number]  Attending Physician Name and Address:  Elodia Florence., *  Relative Name and Phone Number:  Trystan Eads [phone]    Current Level of Care: Hospital Recommended Level of Care: [place] Prior Approval Number:    Date Approved/Denied:   PASRR Number: [id number]  Discharge Plan: SNF    Current Diagnoses: Patient Active Problem List   Diagnosis Date Noted   Protein-calorie malnutrition, moderate ([place]) 01/07/2022   Hoarseness of voice 01/06/2022   Cellulitis 01/05/2022   Cellulitis of right leg 01/04/2022   PAD (peripheral artery disease) ([place]) 01/04/2022   Leg ulcer, right, limited to breakdown of skin ([place]) 01/04/2022   Anemia 01/04/2022   AKI (acute kidney injury) ([place]) 01/04/2022   Hypothyroidism 01/04/2022   Tobacco abuse 01/04/2022    Orientation RESPIRATION BLADDER Height & Weight     Self, Time, Situation, Place  Normal Continent Weight: 113 lb 8.6 oz (51.5 kg) Height:  '5\' 6"'$  (167.6 cm)  BEHAVIORAL SYMPTOMS/MOOD NEUROLOGICAL BOWEL NUTRITION STATUS      Continent Diet (see discharge summary)  AMBULATORY STATUS COMMUNICATION OF NEEDS Skin   Limited Assist Verbally Other (Comment) (right ankle open incision)                       Personal Care Assistance Level of Assistance  Bathing, Dressing Bathing Assistance: Limited assistance   Dressing Assistance: Limited assistance     Functional Limitations Info  Sight, Hearing Sight Info: Impaired Hearing Info: Impaired      SPECIAL CARE FACTORS FREQUENCY  PT (By licensed PT), OT (By licensed OT)     PT Frequency:  per facility OT Frequency: per facility            Contractures      Additional Factors Info  Code Status Code Status Info: DNR             Current Medications (01/09/2022):  This is the current hospital active medication list Current Facility-Administered Medications  Medication Dose Route Frequency Provider Last Rate Last Admin   acetaminophen (TYLENOL) tablet 650 mg  650 mg Oral Q6H PRN Toy Baker, MD   650 mg at 01/09/22 0539   Or   acetaminophen (TYLENOL) suppository 650 mg  650 mg Rectal Q6H PRN Toy Baker, MD       aspirin EC tablet 81 mg  81 mg Oral Daily Doutova, Anastassia, MD   81 mg at 01/09/22 0913   atorvastatin (LIPITOR) tablet 10 mg  10 mg Oral Daily Doutova, Anastassia, MD   10 mg at 01/09/22 0913   buPROPion (WELLBUTRIN SR) 12 hr tablet 150 mg  150 mg Oral BID Doutova, Anastassia, MD   150 mg at 01/09/22 0913   cefTRIAXone (ROCEPHIN) 1 g in sodium chloride 0.9 % 100 mL IVPB  1 g Intravenous Q24H Elodia Florence., MD 200 mL/hr at 01/08/22 2200 1 g at 01/08/22 2200   clopidogrel (PLAVIX) tablet 75 mg  75 mg Oral Daily Angelia Mould, MD   75 mg at 01/09/22 0913   enoxaparin (LOVENOX) injection 40 mg  40 mg Subcutaneous Q24H  Onnie Boer Q, RPH-CPP   40 mg at 01/09/22 0913   feeding supplement (ENSURE ENLIVE / ENSURE PLUS) liquid 237 mL  237 mL Oral TID BM Elodia Florence., MD   237 mL at 01/09/22 1338   HYDROcodone-acetaminophen (NORCO/VICODIN) 5-325 MG per tablet 1-2 tablet  1-2 tablet Oral Q4H PRN Toy Baker, MD   2 tablet at 01/09/22 0913   levothyroxine (SYNTHROID) tablet 25 mcg  25 mcg Oral Q0600 Toy Baker, MD   25 mcg at 01/09/22 0539   multivitamin with minerals tablet 1 tablet  1 tablet Oral Daily Elodia Florence., MD   1 tablet at 01/09/22 0913   oxyCODONE-acetaminophen (PERCOCET/ROXICET) 5-325 MG per tablet 1 tablet  1 tablet Oral Q4H PRN Toy Baker, MD   1 tablet at 01/07/22 1633    rOPINIRole (REQUIP) tablet 0.5 mg  0.5 mg Oral QHS Doutova, Anastassia, MD   0.5 mg at 01/08/22 2157   traMADol (ULTRAM) tablet 50 mg  50 mg Oral Q6H PRN Toy Baker, MD   50 mg at 01/06/22 2046   vancomycin (VANCOREADY) IVPB 1250 mg/250 mL  1,250 mg Intravenous Q48H Kaleen Mask, RPH 166.7 mL/hr at 01/08/22 2237 1,250 mg at 01/08/22 2237     Discharge Medications: Please see discharge summary for a list of discharge medications.  Relevant Imaging Results:  Relevant Lab Results:   Additional Information [id number]  Ina Homes, LCSWA

## 2022-01-09 NOTE — TOC Progression Note (Signed)
Transition of Care [facility]) - Progression Note    Patient Details  Name: Tamara Johnson MRN: [id number] Date of Birth: 12-Jan-1936  Transition of [facility]) CM/SW Contact  Bartholomew Crews, RN Phone Number: [phone] 01/09/2022, 3:56 PM  Clinical Narrative:     Spoke with patient and son, Tamara Johnson, at the bedside to discuss post acute transition. Discussed Medicare criteria for STR SNF. Patient is currently meeting [facility] criteria for PT, and recommendations for OT are no follow up needed. Patient does have twice daily dressing changes for skilled nursing need. Ronnie requesting 1-2 weeks of SNF for patient to continue to get her strength back and to demonstrate improved wound healing. SNF choice is [facility]. Further discussed HH needs for RN, PT, Aide, and SW. Previous RNCM has already arranged HH with [facility]. Discussed skilled vs custodial care and paying privately for custodial care. Offered some resources for caregivers. Ronnie to reach out to family to see about availability for assistance. DME RW and 3n1 have been delivered to the room. TOC following for transition needs.   Expected Discharge Plan: [place] Barriers to Discharge: Continued Medical Work up  Expected Discharge Plan and Services Expected Discharge Plan: [place]   Discharge Planning Services: CM Consult Post Acute Care Choice: Home Health, Durable Medical Equipment Living arrangements for the past 2 months: Single Family Home                 DME Arranged: 3-N-1, Walker rolling DME Agency: [employer] Date DME Agency Contacted: 01/05/22 Time DME Agency Contacted: [id number] Representative spoke with at DME Agency: Alecia Lemming HH Arranged: RN, PT, OT [place] Agency: [place] Date [place]: 01/05/22 Time [place]: [place] Representative spoke with at [place]: [place] ([place]) Interventions    Readmission Risk  Interventions     View : No data to display.

## 2022-01-09 NOTE — Plan of Care (Signed)
  Problem: Nutrition: Goal: Adequate nutrition will be maintained Outcome: Progressing   Problem: Pain Managment: Goal: General experience of comfort will improve Outcome: Progressing   Problem: Safety: Goal: Ability to remain free from injury will improve Outcome: Progressing   

## 2022-01-09 NOTE — Progress Notes (Signed)
PROGRESS NOTE    Tamara Johnson  WLN:[id number] DOB: April 23, 1936 DOA: 01/04/2022 PCP: Bonnita Nasuti, MD  Chief Complaint  Patient presents with   Leg Pain    Brief Narrative:  86 yo with hx tobacco abuse, PAD, hypothyroidism, dyslipidemia here with right lower extremity ulcers, redness, and pain.  She's been admitted with concern cellulitis and for wound care.  Vascular is following.  See below for additional details    Assessment & Plan:   Principal Problem:   Cellulitis of right leg Active Problems:   Cellulitis   PAD (peripheral artery disease) (HCC)   Leg ulcer, right, limited to breakdown of skin (H[facility])   Hoarseness of voice   Anemia   AKI (acute kidney injury) ([place])   Hypothyroidism   Tobacco abuse   Protein-calorie malnutrition, moderate ([facility])   Assessment and Plan: * Cellulitis of right leg S/p stent to R SFA on 11/30/2021 by Dr. Trula Slade Seen by dr. Scot Dock, recommending IV abx and pain control as well as wound care follow up Follow ESR (50, elevated), CRP (wnl) Follow MRSA PCR positive vanc/ceftriaxone Appreciate wound care recs If no improvement with pain, will follow MRI R ankle - she's continuing to improve today, will defer additional imaging at this point.  Plan for antibiotics and twice daily dressing changes with Xeroform, 4 x 4's, Kerlix, and Tamara Johnson 4 inch Ace for compression, Elevation and protective foot wear. F/U will be arranged with [facility]  Hoarseness of voice About 10 days or so ? Laryngitis, follow for now May need outpatient ENT follow up  Leg ulcer, right, limited to breakdown of skin ([place]) Wound care recs Appreciate assistance  Plan for antibiotics and twice daily dressing changes with Xeroform, 4 x 4's, Kerlix, and Tamara Johnson 4 inch Ace for compression, Elevation and protective foot wear. F/U will be arranged with [facility]  PAD (peripheral artery disease) (HCC) Aspirin, plavix, statin S/p stent to R SFA 11/30/2021 by Dr. Trula Slade  Anemia follow  AKI  (acute kidney injury) ([place]) Mild, follow  Hypothyroidism synthroid  Tobacco abuse Encourage cessation  Protein-calorie malnutrition, moderate ([place]) RD     DVT prophylaxis: lovenox Code Status: DNR Family Communication: none Disposition:   Status is: Inpatient Remains inpatient appropriate because: continued pain control and IV abx   Consultants:  vascular  Procedures:  none  Antimicrobials:  Anti-infectives (From admission, onward)    Start     Dose/Rate Route Frequency Ordered Stop   01/06/22 2200  vancomycin (VANCOREADY) IVPB 1250 mg/250 mL        1,250 mg 166.7 mL/hr over 90 Minutes Intravenous Every 48 hours 01/05/22 0939     01/05/22 2000  cefTRIAXone (ROCEPHIN) 1 g in sodium chloride 0.9 % 100 mL IVPB        1 g 200 mL/hr over 30 Minutes Intravenous Every 24 hours 01/05/22 1758     01/04/22 2015  vancomycin (VANCOCIN) IVPB 1000 mg/200 mL premix        1,000 mg 200 mL/hr over 60 Minutes Intravenous  Once 01/04/22 2012 01/04/22 2312   01/04/22 2015  ceFEPIme (MAXIPIME) 2 g in sodium chloride 0.9 % 100 mL IVPB  Status:  Discontinued        2 g 200 mL/hr over 30 Minutes Intravenous Every 12 hours 01/04/22 2012 01/05/22 1757   01/04/22 2011  vancomycin variable dose per unstable renal function (pharmacist dosing)  Status:  Discontinued         Does not apply See admin instructions  01/04/22 2012 01/05/22 0939   01/04/22 1900  ceFAZolin (ANCEF) IVPB 1 g/50 mL premix        1 g 100 mL/hr over 30 Minutes Intravenous  Once 01/04/22 1855 01/04/22 2036       Subjective: Pain is improved  Objective: Vitals:   01/08/22 0540 01/08/22 1705 01/09/22 0515 01/09/22 0920  BP: (!) 152/53 133/74 (!) 153/69 (!) 146/71  Pulse: (!) 59 78 70 65  Resp: _0 Temp: 98 F (36.7 C) 98.2 F (36.8 C) 98.7 F (37.1 C) 97.8 F (36.6 C)  TempSrc: Oral Oral Oral Oral  SpO2: 95% 95% 96% 97%  Weight:      Height:        Intake/Output Summary (Last 24 hours) at  01/09/2022 1531 Last data filed at 01/08/2022 1700 Gross per 24 hour  Intake 120 ml  Output --  Net 120 ml    Filed Weights   01/05/22 0900 01/06/22 0919 01/07/22 0500  Weight: 50.1 kg 53.3 kg 51.5 kg    Examination:  General: No acute distress. Up and walking with walker from sink to chair. This is my first time seeing her oob. Lungs: unlabored Neurological: Alert and oriented 3. Moves all extremities 4 with equal strength. Cranial nerves II through XII grossly intact. Extremities: dressing to RLE    Data Reviewed: I have personally reviewed following labs and imaging studies  CBC: Recent Labs  Lab 01/04/22 1427 01/05/22 0248 01/06/22 0121 01/07/22 0619 01/08/22 0131  WBC 8.1 8.3 6.3 6.0 6.2  NEUTROABS 5.5  --  3.4 3.7 3.6  HGB 10.7* 9.7* 9.2* 9.6* 8.9*  HCT 36.5 30.0* 30.1* 30.6* 28.9*  MCV 103.1* 95.5 97.4 96.5 97.6  PLT 271 245 244 227 472    Basic Metabolic Panel: Recent Labs  Lab 01/04/22 1427 01/04/22 2242 01/05/22 0248 01/06/22 0121 01/07/22 0619 01/08/22 0131  NA 139  --  137 139 140 140  K 3.7  --  3.5 3.5 3.8 3.7  CL 107  --  107 110 109 108  CO2 23  --  _1 GLUCOSE 103*  --  107* 111* 101* 114*  BUN 23  --  _2 CREATININE 1.15*  --  0.94 0.91 0.71 0.67  CALCIUM 8.8*  --  8.5* 8.4* 8.5* 8.4*  MG  --  1.9  --  2.0 2.0 2.0  PHOS  --  3.1  --  3.9 3.8 4.2    GFR: Estimated Creatinine Clearance: 43.3 mL/min (by C-G formula based on SCr of 0.67 mg/dL).  Liver Function Tests: Recent Labs  Lab 01/04/22 2242 01/05/22 0248 01/06/22 0121 01/07/22 0619 01/08/22 0131  AST 17 13* 15 16 14*  ALT _3 ALKPHOS 96 98 91 86 87  BILITOT 0.3 0.2* 0.4 0.4 0.5  PROT 6.1* 5.7* 5.4* 5.8* 5.5*  ALBUMIN 2.8* 2.6* 2.5* 2.6* 2.5*    CBG: No results for input(s): GLUCAP in the last 168 hours.   Recent Results (from the past 240 hour(s))  MRSA Next Gen by PCR, Nasal     Status: Abnormal   Collection Time: 01/08/22  5:06 PM    Specimen: Nasal Mucosa; Nasal Swab  Result Value Ref Range Status   MRSA by PCR Next Gen DETECTED (Zipporah Finamore) NOT DETECTED Final    Comment: RESULT CALLED TO, READ BACK BY AND VERIFIED WITH: RN ISRAA MEDANI 01/08/22_4 :55 BY TW (NOTE) The GeneXpert MRSA  Assay (FDA approved for NASAL specimens only), is one component of Takya Vandivier comprehensive MRSA colonization surveillance program. It is not intended to diagnose MRSA infection nor to guide or monitor treatment for MRSA infections. Test performance is not FDA approved in patients less than 74 years old. Performed at [facility], [place] [address] [place], [place] [address]          Radiology Studies: No results found.      Scheduled Meds:  aspirin EC  81 mg Oral Daily   atorvastatin  10 mg Oral Daily   buPROPion  150 mg Oral BID   clopidogrel  75 mg Oral Daily   enoxaparin (LOVENOX) injection  40 mg Subcutaneous Q24H   feeding supplement  237 mL Oral TID BM   levothyroxine  25 mcg Oral Q0600   multivitamin with minerals  1 tablet Oral Daily   rOPINIRole  0.5 mg Oral QHS   Continuous Infusions:  cefTRIAXone (ROCEPHIN)  IV 1 g (01/08/22 2200)   vancomycin 1,250 mg (01/08/22 2237)     LOS: 4 days    Time spent: over 30 min    Fayrene Helper, MD [facility]   To contact the attending provider between 7A-7P or the covering provider during after hours 7P-7A, please log into the web site [email or web address] and access using universal [place] password for that web site. If you do not have the password, please call the hospital operator.  01/09/2022, 3:31 PM

## 2022-01-10 LAB — C-REACTIVE PROTEIN: CRP: 0.6 mg/dL (ref <1.0)

## 2022-01-10 LAB — CBC WITH DIFFERENTIAL/PLATELET
Abs Immature Granulocytes: 0.03 10*3/uL (ref 0.00–0.07)
Basophils Absolute: 0 10*3/uL (ref 0.0–0.1)
Basophils Relative: 1 %
Eosinophils Absolute: 0 10*3/uL (ref 0.0–0.5)
Eosinophils Relative: 0 %
HCT: 33.5 % — ABNORMAL LOW (ref 36.0–46.0)
Hemoglobin: 10.6 g/dL — ABNORMAL LOW (ref 12.0–15.0)
Immature Granulocytes: 1 %
Lymphocytes Relative: 15 %
Lymphs Abs: 0.9 10*3/uL (ref 0.7–4.0)
MCH: 30.9 pg (ref 26.0–34.0)
MCHC: 31.6 g/dL (ref 30.0–36.0)
MCV: 97.7 fL (ref 80.0–100.0)
Monocytes Absolute: 0.7 10*3/uL (ref 0.1–1.0)
Monocytes Relative: 11 %
Neutro Abs: 4.4 10*3/uL (ref 1.7–7.7)
Neutrophils Relative %: 72 %
Platelets: 254 10*3/uL (ref 150–400)
RBC: 3.43 MIL/uL — ABNORMAL LOW (ref 3.87–5.11)
RDW: 16.1 % — ABNORMAL HIGH (ref 11.5–15.5)
WBC: 6 10*3/uL (ref 4.0–10.5)
nRBC: 0 % (ref 0.0–0.2)

## 2022-01-10 LAB — COMPREHENSIVE METABOLIC PANEL
ALT: 12 U/L (ref 0–44)
AST: 19 U/L (ref 15–41)
Albumin: 2.9 g/dL — ABNORMAL LOW (ref 3.5–5.0)
Alkaline Phosphatase: 96 U/L (ref 38–126)
Anion gap: 6 (ref 5–15)
BUN: 17 mg/dL (ref 8–23)
CO2: 27 mmol/L (ref 22–32)
Calcium: 8.8 mg/dL — ABNORMAL LOW (ref 8.9–10.3)
Chloride: 106 mmol/L (ref 98–111)
Creatinine, Ser: 0.85 mg/dL (ref 0.44–1.00)
GFR, Estimated: >60 mL/min (ref >60)
Glucose, Bld: 101 mg/dL — ABNORMAL HIGH (ref 70–99)
Potassium: 3.7 mmol/L (ref 3.5–5.1)
Sodium: 139 mmol/L (ref 135–145)
Total Bilirubin: 0.5 mg/dL (ref 0.3–1.2)
Total Protein: 6.2 g/dL — ABNORMAL LOW (ref 6.5–8.1)

## 2022-01-10 LAB — SEDIMENTATION RATE: Sed Rate: 54 mm/hr — ABNORMAL HIGH (ref 0–22)

## 2022-01-10 MED ORDER — VANCOMYCIN VARIABLE DOSE PER UNSTABLE RENAL FUNCTION (PHARMACIST DOSING): Status: DC

## 2022-01-10 MED ORDER — HYDROCODONE-ACETAMINOPHEN 5-325 MG PO TABS
1.0000 | ORAL_TABLET | Freq: Four times a day (QID) | ORAL | 0 refills | Status: AC | PRN
Start: 2022-01-10 — End: 2022-01-13

## 2022-01-10 MED ORDER — MUPIROCIN 2 % EX OINT
TOPICAL_OINTMENT | Freq: Two times a day (BID) | CUTANEOUS | Status: DC
Start: 2022-01-10 — End: 2022-01-10
  Filled 2022-01-10: qty 22

## 2022-01-10 MED ORDER — CHLORHEXIDINE GLUCONATE CLOTH 2 % EX PADS
6.0000 | MEDICATED_PAD | Freq: Every day | CUTANEOUS | Status: DC
Start: 2022-01-10 — End: 2022-01-10
  Administered 2022-01-10: 6 via TOPICAL

## 2022-01-10 MED ORDER — DOXYCYCLINE HYCLATE 100 MG PO CAPS: 100.0000 mg | ORAL_CAPSULE | Freq: Two times a day (BID) | ORAL | 0 refills | Status: AC

## 2022-01-10 MED ORDER — AMOXICILLIN-POT CLAVULANATE 875-125 MG PO TABS: 1.0000 | ORAL_TABLET | Freq: Two times a day (BID) | ORAL | 0 refills | Status: AC

## 2022-01-10 NOTE — Progress Notes (Signed)
Physical Therapy Treatment Patient Details Name: Tamara Johnson MRN: [id number] DOB: [birth year] Today's Date: 01/10/2022   History of Present Illness Pt is an 86 yo F presenting 01/04/22 with worsening R leg ulcers and pain due to  peripheral arterial disease and chronic venous insufficiency; 11/30/2021 R SFA stenting. PHI: Cellulitis, CAD, PAD, caudication, previous left femoral to below-knee popliteal artery bypass, tobacco use    PT Comments    Pt demos improved activity tolerance, strength and endurance during PT tx session, ambulating inc distance with mod I and use of RW.  Able to demo transfers and bed mobility with mod I as well.   Pt will be D/C'd from PT caseload at this time as she is no longer in need of skilled tx.  Pt and NSG educated that pt will be moved to mobility caseload and should continue to mobilize throughout hospital admission to maintain strength.  Should pt experience a change in medical status, please reconsult.  Recommendations for follow up therapy are one component of a multi-disciplinary discharge planning process, led by the attending physician.  Recommendations may be updated based on patient status, additional functional criteria and insurance authorization.  Follow Up Recommendations  Home health PT     Assistance Recommended at Discharge PRN  Patient can return home with the following Assistance with cooking/housework   Equipment Recommendations  Rolling walker (2 wheels)    Recommendations for Other Services       Precautions / Restrictions Precautions Precautions: Fall Required Braces or Orthoses: Other Brace Other Brace: R prevalon boot in bed Restrictions Other Position/Activity Restrictions: LE elevation recommended     Mobility  Bed Mobility Overal bed mobility: Modified Independent             General bed mobility comments: Transfer back to bed with independence. Patient Response: Cooperative  Transfers Overall transfer level:  Modified independent Equipment used: Rolling walker (2 wheels) Transfers: Sit to/from Stand Sit to Stand: Modified independent (Device/Increase time)           General transfer comment: Pt demos sit > stand x 2.  1st time with RW in preparation for amb and 2nd time to adjust gown without use of AD.  Able to complete both independently.  Demos good balance.    Ambulation/Gait Ambulation/Gait assistance: Modified independent (Device/Increase time) Gait Distance (Feet): 300 Feet Assistive device: Rolling walker (2 wheels) Gait Pattern/deviations: WFL(Within Functional Limits)       General Gait Details: Pt amb around NSG floor without difficulty.  Demos good cadence, no LOB.   Stairs             Wheelchair Mobility    Modified Rankin (Stroke Patients Only)       Balance Overall balance assessment: Modified Independent                                          Cognition Arousal/Alertness: Awake/alert Behavior During Therapy: WFL for tasks assessed/performed Overall Cognitive Status: Within Functional Limits for tasks assessed                                 General Comments: Pt is getting back from bathroom with PCT when PT arrives.  Agreeable to amb in halls.  Reports that pain is minimal.        Exercises Other  Exercises Other Exercises: Pt educated on LE therex that she can complete in bed.  SLR, hip ABD, heel slides and ankle pumps.  Demos all with independence.    General Comments        Pertinent Vitals/Pain Pain Assessment Pain Assessment: Faces Faces Pain Scale: Hurts a little bit Pain Location: R foot Pain Descriptors / Indicators: Tender, Discomfort    Home Living                          Prior Function            PT Goals (current goals can now be found in the care plan section) Progress towards PT goals: Progressing toward goals    Frequency    Min 3X/week      PT Plan Current plan  remains appropriate    Co-evaluation              AM-PAC PT "6 Clicks" Mobility   Outcome Measure  Help needed turning from your back to your side while in a flat bed without using bedrails?: None Help needed moving from lying on your back to sitting on the side of a flat bed without using bedrails?: A Little Help needed moving to and from a bed to a chair (including a wheelchair)?: None Help needed standing up from a chair using your arms (e.g., wheelchair or bedside chair)?: None Help needed to walk in hospital room?: None Help needed climbing 3-5 steps with a railing? : A Little 6 Click Score: 22    End of Session Equipment Utilized During Treatment: Gait belt Activity Tolerance: Patient tolerated treatment well Patient left: in bed;with call bell/phone within reach;with bed alarm set;with nursing/sitter in room Nurse Communication: Mobility status       Time: 9449-6759 PT Time Calculation (min) (ACUTE ONLY): 16 min  Charges:  $Gait Training: 8-22 mins                    Harshan Kearley A. Raynor Calcaterra, PT, DPT Acute Rehabilitation Services Office: [place] 01/10/2022, 10:05 AM

## 2022-01-10 NOTE — Care Management Important Message (Signed)
Important Message  Patient Details  Name: Tamara Johnson MRN: [id number] Date of Birth: [birth year]   Medicare Important Message Given:  Yes     [name] [address] 01/10/2022, 3:56 PM

## 2022-01-10 NOTE — Discharge Summary (Signed)
Physician Discharge Summary  Tamara Johnson:[id number] DOB: 1936-08-04 DOA: 01/04/2022  PCP: Bonnita Nasuti, MD  Admit date: 01/04/2022 Discharge date: 01/10/2022  Time spent: 40 minutes  Recommendations for Outpatient Follow-up:  Follow outpatient CBC/CMP  Reestablish with wound care ([place] vs [facility]) Follow up with vascular outpatient  Continue wound care as below Hoarseness warrants follow up with ENT outpatient  If wound worsens or infection returns, consider MRI   Discharge Diagnoses:  Principal Problem:   Cellulitis of right leg Active Problems:   Cellulitis   PAD (peripheral artery disease) (HCC)   Leg ulcer, right, limited to breakdown of skin (HCC)   Hoarseness of voice   Anemia   AKI (acute kidney injury) ([place])   Hypothyroidism   Tobacco abuse   Protein-calorie malnutrition, moderate ([place])  Discharge Condition: stable  Diet recommendation: heart healthy   Filed Weights   01/05/22 0900 01/06/22 0919 01/07/22 0500  Weight: 50.1 kg 53.3 kg 51.5 kg    History of present illness:  86 yo with hx tobacco abuse, PAD, hypothyroidism, dyslipidemia here with right lower extremity ulcers, redness, and pain.  She's been admitted with concern cellulitis and for wound care.  Vascular is following.  She's improved with IV antibiotics.  On the day of discharge, she was discharged from PT services due to goals met, her pain had improved, and her redness was also improved.  Family was concerned she needed rehab, but she was doing well with therapy and mobilizing well.  Unable to pay out of pocket for custodial care.  As she was thought safe for discharge, discharged with home health services and plan for PO abx, outpatient wound care, and follow outpatient.  See below for additional details  Hospital Course:  Assessment and Plan: * Cellulitis of right leg S/p stent to R SFA on 11/30/2021 by Dr. Trula Slade Seen by dr. Scot Dock, recommending IV abx and pain control as well as  wound care follow up Follow ESR (50, elevated, but mildly when adjusted for age - (age+10)/2 ), CRP (wnl) Follow MRSA PCR positive Vanc/ceftriaxone - discharge with augmentin/doxycycline Appreciate wound care recs Will refer to wound care center If no improvement with pain, will follow MRI R ankle - she's continued to improve with less pain.  I don't think MRI necessary here with mildly elevated inflammatory markers, good ROM of ankle, and continued improvement.  Can consider outpatient if she worsens (discussed with [facility]).  Plan for antibiotics and twice daily dressing changes with Xeroform, 4 x 4's, Kerlix, and Elye Harmsen 4 inch Ace for compression, Elevation and protective foot wear. F/U will be arranged with VVS  Hoarseness of voice About 10 days or so ? Laryngitis, follow for now Would recommend ENT follow up outpatient esp given hx tobacco use/smoking   Leg ulcer, right, limited to breakdown of skin ([place]) Wound care recs Appreciate assistance Referral to wound care outpatient.  Plan for antibiotics and twice daily dressing changes with Xeroform, 4 x 4's, Kerlix, and Tamara Johnson 4 inch Ace for compression, Elevation and protective foot wear. F/U will be arranged with VVS  PAD (peripheral artery disease) (HCC) Aspirin, plavix, statin S/p stent to R SFA 11/30/2021 by Dr. Trula Slade 5/15 resting ABI within normal range on R Follow with vascular as scheduled  Anemia follow  AKI (acute kidney injury) ([place]) resolved  Hypothyroidism synthroid  Tobacco abuse Encourage cessation  Protein-calorie malnutrition, moderate ([place]) RD      Procedures: none   Consultations: vascular  Discharge Exam: Vitals:  01/10/22 0730 01/10/22 1551  BP: (!) 163/78 (!) 143/70  Pulse: 83 91  Resp: 16 16  Temp: 98.4 F (36.9 C) 98.1 F (36.7 C)  SpO2: 91% 93%   No new complaints Pain is improved Long discussion with son Tamara Johnson.  Family had been hopeful to get her into [facility] skilled nursing facility  for rehab, but she's doing well with therapy.  They're really hesitant to take her home.  He mentions the fear that she's going to fall when she's home alone and break Tamara Johnson hip, etc (I saw her up with her walker yesterday alone and she mobilized well, able to turn and face me, then turn back and walk to chair with walker - PT also signed off on her today as she'd met all goals).  I tried to reassure him and family, she's safe for discharge based on how she's doing with therapy.  Social work talked to him about paying out of pocket, but they cannot pay out of pocket.  Followed up after discussion with SW and offered to answer any questions he might have as well as discussed d/c planning.  General: No acute distress. Cardiovascular: RRR Lungs: unlabored Neurological: Alert and oriented 3. Moves all extremities 4 with equal strength. Cranial nerves II through XII grossly intact. Extremities: ulcerations present to medial and lateral R ankle, much improved redness, only very mild TTP - good ROM of ankle joint   Discharge Instructions   Discharge Instructions     AMB referral to wound care center   Complete by: As directed    Call MD for:  difficulty breathing, headache or visual disturbances   Complete by: As directed    Call MD for:  extreme fatigue   Complete by: As directed    Call MD for:  hives   Complete by: As directed    Call MD for:  persistant dizziness or light-headedness   Complete by: As directed    Call MD for:  persistant nausea and vomiting   Complete by: As directed    Call MD for:  redness, tenderness, or signs of infection (pain, swelling, redness, odor or green/yellow discharge around incision site)   Complete by: As directed    Call MD for:  severe uncontrolled pain   Complete by: As directed    Call MD for:  temperature >100.4   Complete by: As directed    Diet - low sodium heart healthy   Complete by: As directed    Discharge instructions   Complete by: As  directed    You were seen for cellulitis and lower extremity wounds to your right foot/ankle.  The redness and pain has improved with IV antibiotics.  We'll send you home with another 4 days of oral antibiotics.  You'll need to continue wound care at home with twice daily dressing changes with xeroform, 4x4's, kerlix and Tamara Johnson 4 inch ace for compression.  Elevate the leg during the day.  We considered an MRI, but I don't think this is necessary at this time.  Your inflammatory markers are not particularly high and you've improved with antibiotics alone.  If your wound worsens, your pain or redness return, I would recommend consider an MRI at that time with your outpatient doctors based on their evaluation.  You should see ear, nose, and throat for your hoarseness.   Follow up with your PCP within about Umi Mainor week to make sure your foot is still improving and to help arrange follow up appointments  after this hospital stay.   We'll send you home with home health services.    Return for new, recurrent, or worsening symptoms.  Please ask your PCP to request records from this hospitalization so they know what was done and what the next steps will be.   Discharge wound care:   Complete by: As directed    twice daily wound care using Tamara Johnson normal saline cleanse, pat dry, application of antimicrobial nonadherent gauze (xeroform) to the wounds and topping with dry gauze, securing with Kerlix roll gauze and adding Bhavik Cabiness 4-inch ACE bandage for mild compression.  Elevated when in bed or seated.  Wear protective footwear (avoid anything that puts excess pressure or friction on the wounds).   Increase activity slowly   Complete by: As directed       Allergies as of 01/10/2022   No Known Allergies      Medication List     STOP taking these medications    cephALEXin 500 MG capsule Commonly known as: KEFLEX   oxyCODONE-acetaminophen 5-325 MG tablet Commonly known as: PERCOCET/ROXICET       TAKE these  medications    acetaminophen 500 MG tablet Commonly known as: TYLENOL Take 1,000 mg by mouth 2 (two) times daily as needed for moderate pain.   amoxicillin-clavulanate 875-125 MG tablet Commonly known as: AUGMENTIN Take 1 tablet by mouth 2 (two) times daily for 4 days.   Aspirin Low Dose 81 MG tablet Generic drug: aspirin EC TAKE 1 TABLET BY MOUTH EVERY DAY What changed:  how much to take Another medication with the same name was removed. Continue taking this medication, and follow the directions you see here.   atorvastatin 10 MG tablet Commonly known as: Lipitor Take 1 tablet (10 mg total) by mouth daily.   atorvastatin 10 MG tablet Commonly known as: LIPITOR TAKE 1 TABLET(10 MG) BY MOUTH DAILY   Breztri Aerosphere 160-9-4.8 MCG/ACT Aero Generic drug: Budeson-Glycopyrrol-Formoterol Inhale 1 puff into the lungs in the morning and at bedtime.   buPROPion 150 MG 12 hr tablet Commonly known as: ZYBAN Take 150 mg by mouth 2 (two) times daily.   cetirizine 10 MG tablet Commonly known as: ZYRTEC Take 10 mg by mouth daily.   clopidogrel 75 MG tablet Commonly known as: Plavix Take 1 tablet (75 mg total) by mouth daily.   doxycycline 100 MG capsule Commonly known as: VIBRAMYCIN Take 1 capsule (100 mg total) by mouth 2 (two) times daily for 4 days.   FeroSul 325 (65 FE) MG tablet Generic drug: ferrous sulfate Take 325 mg by mouth daily.   HYDROcodone-acetaminophen 5-325 MG tablet Commonly known as: NORCO/VICODIN Take 1 tablet by mouth every 6 (six) hours as needed for up to 3 days for moderate pain. (Do not take this if you take tramadol)   ICaps Areds 2 Caps Take 1 tablet by mouth in the morning and at bedtime.   levothyroxine 25 MCG tablet Commonly known as: SYNTHROID Take 25 mcg by mouth every morning.   loratadine 10 MG tablet Commonly known as: CLARITIN Take 10 mg by mouth daily.   mupirocin cream 2 % Commonly known as: BACTROBAN Apply 1 application  topically every Wednesday.   mupirocin ointment 2 % Commonly known as: BACTROBAN Apply to wound twice Tamara Johnson day.   rOPINIRole 0.5 MG tablet Commonly known as: REQUIP Take 0.5 mg by mouth at bedtime.   SSD 1 % cream Generic drug: silver sulfADIAZINE Apply topically daily.   traMADol 50 MG tablet  Commonly known as: Ultram Take 1 tablet (50 mg total) by mouth every 6 (six) hours as needed. What changed: reasons to take this   triamcinolone cream 0.1 % Commonly known as: KENALOG Apply topically 2 (two) times daily.   vitamin B-12 500 MCG tablet Commonly known as: CYANOCOBALAMIN Take 500 mcg by mouth daily.   Vitamin D (Ergocalciferol) 1.25 MG (50000 UNIT) Caps capsule Commonly known as: DRISDOL Take 50,000 Units by mouth once Tamara Johnson week.               Durable Medical Equipment  (From admission, onward)           Start     Ordered   01/05/22 1406  For home use only DME 3 n 1  Once        01/05/22 1405   01/05/22 1406  For home use only DME Walker rolling  Once       Question Answer Comment  Walker: With 5 Inch Wheels   Patient needs Tamara Johnson walker to treat with the following condition Weakness      01/05/22 1405              Discharge Care Instructions  (From admission, onward)           Start     Ordered   01/10/22 0000  Discharge wound care:       Comments: twice daily wound care using Tamara Johnson normal saline cleanse, pat dry, application of antimicrobial nonadherent gauze (xeroform) to the wounds and topping with dry gauze, securing with Kerlix roll gauze and adding Tamara Johnson 4-inch ACE bandage for mild compression.  Elevated when in bed or seated.  Wear protective footwear (avoid anything that puts excess pressure or friction on the wounds).   01/10/22 1612           No Known Allergies  Follow-up Information     [facility] [place] Follow up.   Specialty: Home Health Services Why: for home health services. they will call Tamara Johnson in 1-2 days to set up your  first home appointment Contact information: [address] [place] [address] [phone]         [facility] for private pay Follow up.   Contact information: [facility] [phone] [place] [phone]        [facility] Follow [facility].   Specialty: Wound Care Why: follow up here or in [place] per your preference Contact information: [place], [place] [id number] [place] [phone]        Bonnita Nasuti, MD Follow up.   Specialty: Internal Medicine Why: follow with Dr. London Pepper after discharge have Dr. London Pepper take Tamara Johnson look at your right foot and foot wounds have Dr. London Pepper refer you back to the [facility] wound care center ask Dr. London Pepper if you should see ENT for your hoarseness Contact information: [address] [place] [address] [phone]         [facility] Follow up.   Why: follow up as scheduled with vascular Contact information: [address] [place] [phone]                 The results of significant diagnostics from this hospitalization (including imaging, microbiology, ancillary and laboratory) are listed below for reference.    Significant Diagnostic Studies: DG Ankle Complete Right  Result Date: 01/04/2022 **Note De-Identified vi Obfusction** CLINICL DT:  Right nkle ulcers.  Cellulitis EXM: RIGHT NKLE - COMPLETE 3+ VIEW COMPRISON:  None vilble. FINDINGS: Diffuse osseous deminerliztion. Soft tissue wounds or ulcertions overlies the medil nd lterl mlleoli. No evidence of underlying bone erosion or periostel elevtion. No cute frcture or disloction. IMPRESSION: 1. Soft tissue wounds or ulcertions overlies the medil nd lterl mlleoli. No rdiogrphic evidence of underlying cute osteomyelitis. 2. Diffuse osseous deminerliztion. Electroniclly  Signed   By: Dvin Poke D.O.   On: 01/04/2022 15:07   VS [place] BI WITH/WO TBI  Result Dte: 12/20/2021  LOWER EXTREMITY DOPPLER STUDY Ptient Nme:  Tamara Johnson  Dte of Exm:   12/20/2021 Medicl Rec #: [id number]       ccession #:    [id number] Dte of Birth: pr 06, 1937       Ptient Gender: F Ptient ge:   50 yers Exm Loction:  [facility] Procedure:      VS [place] BI WITH/WO TBI Referring Phys: EMM COLLINS --------------------------------------------------------------------------------  Indictions: Peripherl rtery disese. High Risk Fctors: Current smoker.  Vsculr               Right SF stent 11/30/21. 10/04/19 Ctheriztion of left Interventions:         EI. Performing Technologist: lvi Grove RVT  Exmintion Guidelines:  complete evlution includes t minimum, Doppler wveform signls nd systolic blood pressure reding t the level of bilterl brchil, nterior tibil, nd posterior tibil rteries, when vessel segments re ccessible. Bilterl testing is considered n integrl prt of  complete exmintion. Photoelectric Plethysmogrph (PPG) wveforms nd toe systolic pressure redings re included s required nd dditionl duplex testing s needed. Limited exmintions for reoccurring indictions my be performed s noted.  BI Findings: +---------+------------------+-----+--------+--------+ Right    Rt Pressure (mmHg)IndexWveformComment  +---------+------------------+-----+--------+--------+ Brchil 196                                     +---------+------------------+-----+--------+--------+ PT      192               0.98 biphsic         +---------+------------------+-----+--------+--------+ DP       190               0.97 biphsic         +---------+------------------+-----+--------+--------+ Gret Toe134               0.68 bnorml         +---------+------------------+-----+--------+--------+  +---------+------------------+-----+----------+-------+ Left     Lt Pressure (mmHg)IndexWveform  Comment +---------+------------------+-----+----------+-------+ Brchil 185                                      +---------+------------------+-----+----------+-------+ PT      132               0.67 monophsic        +---------+------------------+-----+----------+-------+ DP       133               0.68 monophsic        +---------+------------------+-----+----------+-------+ Gret Toe78                0.40 bsent            +---------+------------------+-----+----------+-------+ +-------+-----------+-----------+------------+------------+ BI/TBITody's BITody's TBIPrevious BIPrevious TBI +-------+-----------+-----------+------------+------------+ Right  0.98  0.68       0.55        0.33         +-------+-----------+-----------+------------+------------+ Left   0.68       0.40       0.65        0.36         +-------+-----------+-----------+------------+------------+  Right ABIs and TBIs appear increased.  Summary: Right: Resting right ankle-brachial index is within normal range. No evidence of significant right lower extremity arterial disease. The right toe-brachial index is abnormal. Left: Resting left ankle-brachial index indicates moderate left lower extremity arterial disease. The left toe-brachial index is abnormal. *See table(s) above for measurements and observations.  Electronically signed by Harold Barban MD on 12/20/2021 at 6:20:22 PM.    Final    VAS [place] LOWER EXTREMITY ARTERIAL DUPLEX  Result Date: 12/20/2021 LOWER EXTREMITY ARTERIAL DUPLEX STUDY Patient Name:  Tamara Johnson  Date of Exam:   12/20/2021 Medical Rec #: [id number]       Accession #:    [id number] Date of Birth: 1936-03-24       Patient Gender: F Patient Age:   78 years [facility]am [facility] Procedure:      VAS [place] LOWER EXTREMITY ARTERIAL DUPLEX Referring Phys:  Harold Barban --------------------------------------------------------------------------------  Indications: Peripheral artery disease. High Risk Factors: Current smoker.  Vascular               Right SFA stent 11/30/21. 10/04/19 Catherization of left Interventions:         EIA. Current ABI:           Right ABI 0.98 Left 0.68 Performing Technologist: Alvia Grove RVT  Examination Guidelines: Kateryn Marasigan complete evaluation includes B-mode imaging, spectral Doppler, color Doppler, and power Doppler as needed of all accessible portions of each vessel. Bilateral testing is considered an integral part of Lota Leamer complete examination. Limited examinations for reoccurring indications may be performed as noted.  +----------+--------+-----+--------+----------+--------+ RIGHT     PSV cm/sRatioStenosisWaveform  Comments +----------+--------+-----+--------+----------+--------+ EIA [id number]                  biphasic  broad    +----------+--------+-----+--------+----------+--------+ CFA Prox  169                  biphasic           +----------+--------+-----+--------+----------+--------+ CFA Distal195                  triphasic          +----------+--------+-----+--------+----------+--------+ DFA       87                   monophasic         +----------+--------+-----+--------+----------+--------+ SFA Prox                                 stent    +----------+--------+-----+--------+----------+--------+ SFA Mid                                  stent    +----------+--------+-----+--------+----------+--------+ SFA Distal                               stent    +----------+--------+-----+--------+----------+--------+ POP Prox  69 **Note De-Identified vi Obfusction** triphsic          +----------+--------+-----+--------+----------+--------+  Right Stent(s): SF +---------------+---+---------------+---------+-----------------+ Prox to Stent  38950-99% stenosisbiphsic plque visulized  +---------------+---+---------------+---------+-----------------+ Proximl Stent 30350-99% stenosisbiphsic brod             +---------------+---+---------------+---------+-----------------+ Mid Stent      91                triphsic                  +---------------+---+---------------+---------+-----------------+ Distl Stent   59                triphsic                  +---------------+---+---------------+---------+-----------------+ Distl to Stent89                triphsic                  +---------------+---+---------------+---------+-----------------+    [place]: Right: Ptent SF stent with incresed velocity t the inflow nd proximl stent.  See tble(s) bove for mesurements nd observtions. Electroniclly signed by Hrold Brbn MD on 12/20/2021 t 6:20:59 PM.    Finl     Microbiology: Recent Results (from the pst 240 hour(s))  MRS Next Gen by PCR, Nsl     Sttus: bnorml   Collection Time: 01/08/22  5:06 PM   Specimen: Nsl Mucos; Nsl Swb  Result Vlue Ref Rnge Sttus   MRS by PCR Next Gen DETECTED () NOT DETECTED Finl    Comment: RESULT CLLED TO, RED BCK BY ND VERIFIED WITH: RN ISR MEDNI 01/08/22_0 :55 BY TW (NOTE) The GeneXpert MRS ssy (FD pproved for NSL specimens only), is one component of  comprehensive MRS coloniztion surveillnce progrm. It is not intended to dignose MRS infection nor to guide or monitor tretment for MRS infections. Test performnce is not FD pproved in ptients less thn 6 yers old. Performed t [facility], [place] [address], [place], [place] [address]      Lbs: Bsic Metbolic Pnel: Recent Lbs  Lb 01/04/22 2242 01/05/22 0248 01/06/22 0121 01/07/22 0619 01/08/22 0131 01/10/22 0909  N  --  137 139 140 140 139  K  --  3.5 3.5 3.8 3.7 3.7  CL  --  107 110 109 108 106  CO2  --  _1 GLUCOSE  --  107* 111* 101* 114* 101*  BUN  --  _2 CRETININE   --  0.94 0.91 0.71 0.67 0.85  CLCIUM  --  8.5* 8.4* 8.5* 8.4* 8.8*  MG 1.9  --  2.0 2.0 2.0  --   PHOS 3.1  --  3.9 3.8 4.2  --    Liver Function Tests: Recent Lbs  Lb 01/05/22 0248 01/06/22 0121 01/07/22 0619 01/08/22 0131 01/10/22 0909  ST 13* 15 16 14* 19  LT _3 LKPHOS 98 91 86 87 96  BILITOT 0.2* 0.4 0.4 0.5 0.5  PROT 5.7* 5.4* 5.8* 5.5* 6.2*  LBUMIN 2.6* 2.5* 2.6* 2.5* 2.9*   No results for input(s): LIPSE, MYLSE in the lst 168 hours. No results for input(s): MMONI in the lst 168 hours. CBC: Recent Lbs  Lb 01/04/22 1427 01/05/22 0248 01/06/22 0121 01/07/22 0619 01/08/22 0131 01/10/22 0909  WBC 8.1 8.3 6.3 6.0 6.2 6.0  NEUTROBS 5.5  --  3.4 3.7 3.6 4.4  HGB 10.7* 9.7* 9.2* 9.6* 8.9* 10.6*  HCT 36.5 30.0* 30.1* 30.6* 28.9* 33.5*  MCV 103.1* 95.5 97.4 96.5 97.6 97.7  PLT 271 245 244 227 238 254   Cardiac Enzymes: Recent Labs  Lab 01/04/22 2242  CKTOTAL 30*   BNP: BNP (last 3 results) No results for input(s): BNP in the last 8760 hours.  ProBNP (last 3 results) No results for input(s): PROBNP in the last 8760 hours.  CBG: No results for input(s): GLUCAP in the last 168 hours.     Signed:  Fayrene Helper MD.  [facility] 01/10/2022, 4:33 PM

## 2022-01-10 NOTE — Progress Notes (Signed)
Mobility Specialist Progress Note:   01/10/22 1230  Mobility  Activity Ambulated with assistance to bathroom  Level of Assistance Standby assist, set-up cues, supervision of patient - no hands on  Assistive Device Front wheel walker  Distance Ambulated (ft) 30 ft  Activity Response Tolerated well  $Mobility charge 1 Mobility   Pt requesting to go to BR. Required no physical assistance throughout. Pt left sitting EOB with all needs met. Bed alarm on.   Nelta Numbers Acute Rehab Secure Chat or Office Phone: [phone]

## 2022-01-10 NOTE — TOC CM/SW Note (Signed)
Tamara Johnson with [facility] aware discharge is today

## 2022-01-10 NOTE — Social Work (Addendum)
CSW spoke with pt son about HH recommendation for pt although he wants his mother to go to a SNF. CSW explained that the family could pay out of pocket but the recommendation is HH and insurance will not cover SNF. Pt son stated he will pick pt up, they cannot pay out of pocket. CSW signing off.

## 2022-01-10 NOTE — Progress Notes (Signed)
Physical Therapy Discharge Patient Details Name: MALEEYAH MCCAUGHEY MRN: [id number] DOB: [birth year] Today's Date: 01/10/2022 Time: 8548-8301 PT Time Calculation (min) (ACUTE ONLY): 16 min  Patient discharged from PT services secondary to goals met and no further PT needs identified.  Please see latest therapy progress note for current level of functioning and progress toward goals.    Progress and discharge plan discussed with patient and/or caregiver: Patient/Caregiver agrees with plan  GP    Jenese Mischke A. Alexios Keown, PT, DPT Acute Rehabilitation Services Office: [place] 01/10/2022, 10:12 AM

## 2022-01-10 NOTE — Progress Notes (Addendum)
Discharge instructions given with pt son present at bedside.   Education with demonstration on wound care given to both pt and son, who states he will be assisting pt with dressing changes at home.   Additional wound care supplies provided. DME (3-in-1 and walker) delivered to bedside for DC home.  Pt transported off unit with all belongings via [facility] and in the care of her son. Pt remains alert and oriented, stable at baseline.

## 2022-01-12 ENCOUNTER — Telehealth: Payer: Self-pay

## 2022-01-12 NOTE — Telephone Encounter (Signed)
Pt's [place] with [facility] called to change from xeroform gauze to Sentyl on pt's R ankle wound. Pt has not been going to wound care, though she has been encouraged to continue with them. Pt has f/u in a month and they are aware of this appt. If pt's wound is not improving over next week, they will call to let us know.

## 2022-02-07 ENCOUNTER — Telehealth: Payer: Self-pay

## 2022-02-07 NOTE — Telephone Encounter (Signed)
[name] with [facility] called stating that the pt had a worsening wound on her R ankle. It was swollen, red, hot to the touch, and very painful. She was requesting a possible new wound care order.  Returned [name]'s call for clarification. She stated that she was supposed to see the pt on 6/28, but the pt had a doc appt. OT saw her wound on 6/30 and alerted [place]. She denies the pt having any fever or significant drainage. She is unable to smell d/t Covid. Worked with pt's son, Tamara Johnson, to facilitate an appt change from 7/10 to 7/5, earliest possible at this time, for [place] and f/u with Dr Scot Dock.  Called [name] to confirm, no answer, lf vm  Called son, Tamara Johnson, to confirm, no answer, lf vm

## 2022-02-09 ENCOUNTER — Encounter: Payer: Self-pay | Admitting: Vascular Surgery

## 2022-02-09 ENCOUNTER — Ambulatory Visit (HOSPITAL_COMMUNITY)
Admission: RE | Admit: 2022-02-09 | Discharge: 2022-02-09 | Disposition: A | Discharge status: Home / Self Care | Payer: Medicare Other | Source: Ambulatory Visit | Attending: Physician Assistant | Admitting: Physician Assistant

## 2022-02-09 ENCOUNTER — Ambulatory Visit: Payer: Medicare Other | Admitting: Vascular Surgery

## 2022-02-09 ENCOUNTER — Ambulatory Visit (INDEPENDENT_AMBULATORY_CARE_PROVIDER_SITE_OTHER): Payer: Medicare Other | Admitting: Vascular Surgery

## 2022-02-09 ENCOUNTER — Telehealth: Payer: Self-pay

## 2022-02-09 VITALS — BP 146/64 | HR 65 | Temp 97.9°F | Resp 20 | Ht 66.0 in | Wt 113.0 lb

## 2022-02-09 DIAGNOSIS — I70299 Other atherosclerosis of native arteries of extremities, unspecified extremity: Secondary | ICD-10-CM

## 2022-02-09 DIAGNOSIS — L97321 Non-pressure chronic ulcer of left ankle limited to breakdown of skin: Secondary | ICD-10-CM | POA: Insufficient documentation

## 2022-02-09 DIAGNOSIS — L97909 Non-pressure chronic ulcer of unspecified part of unspecified lower leg with unspecified severity: Secondary | ICD-10-CM | POA: Insufficient documentation

## 2022-02-09 DIAGNOSIS — I739 Peripheral vascular disease, unspecified: Secondary | ICD-10-CM | POA: Diagnosis present

## 2022-02-09 DIAGNOSIS — M7989 Other specified soft tissue disorders: Secondary | ICD-10-CM

## 2022-02-09 DIAGNOSIS — I6523 Occlusion and stenosis of bilateral carotid arteries: Secondary | ICD-10-CM

## 2022-02-09 MED ORDER — OXYCODONE-ACETAMINOPHEN 5-325 MG PO TABS: 1.0000 | ORAL_TABLET | ORAL | 0 refills | Status: DC | PRN

## 2022-02-09 NOTE — Progress Notes (Signed)
Patient ID: TESHA ARCHAMBEAU, female   DOB: 08-Jan-1936, 86 y.o.   MRN: [id number]  Reason for Consult: Follow-up   Referred by Bonnita Nasuti, MD  Subjective:     HPI:  Tamara Johnson is a 86 y.o. female with history of right SFA stenting 3 months ago for ulceration.  She was again seen in the hospital last month with persistent wound of the lateral malleolus.  She now follows up with [facility] for further wound check.  She states that the wound is hurting quite frequently so much that she cannot get any sleep.  She has not worn compression socks.  Home health nursing reported that the wound is worse.  Past Medical History:  Diagnosis Date   Cellulitis    Family History  Problem Relation Age of Onset   Heart attack Father    Past Surgical History:  Procedure Laterality Date   ABDOMINAL AORTOGRAM W/LOWER EXTREMITY Bilateral 10/04/2019   Procedure: ABDOMINAL AORTOGRAM W/LOWER EXTREMITY;  Surgeon: Angelia Mould, MD;  Location: [facility];  Service: Cardiovascular;  Laterality: Bilateral;   ABDOMINAL AORTOGRAM W/LOWER EXTREMITY N/A 11/30/2021   Procedure: ABDOMINAL AORTOGRAM W/LOWER EXTREMITY;  Surgeon: Serafina Mitchell, MD;  Location: [facility];  Service: Cardiovascular;  Laterality: N/A;   PERIPHERAL VASCULAR INTERVENTION  11/30/2021   Procedure: PERIPHERAL VASCULAR INTERVENTION;  Surgeon: Serafina Mitchell, MD;  Location: [facility];  Service: Cardiovascular;;   PR VEIN BYPASS GRAFT,AORTO-FEM-POP      Short Social History:  Social History   Tobacco Use   Smoking status: Every Day    Packs/day: 1.00    Types: Cigarettes    Passive exposure: Never   Smokeless tobacco: Never  Substance Use Topics   Alcohol use: No    Alcohol/week: 0.0 standard drinks of alcohol    No Known Allergies  Current Outpatient Medications  Medication Sig Dispense Refill   acetaminophen (TYLENOL) 500 MG tablet Take 1,000 mg by mouth 2 (two) times daily as needed for  moderate pain.     ASPIRIN LOW DOSE 81 MG EC tablet TAKE 1 TABLET BY MOUTH EVERY DAY (Patient taking differently: Take 81 mg by mouth daily.) 150 tablet 2   atorvastatin (LIPITOR) 10 MG tablet Take 1 tablet (10 mg total) by mouth daily. 30 tablet 6   Budeson-Glycopyrrol-Formoterol (BREZTRI AEROSPHERE) 160-9-4.8 MCG/ACT AERO Inhale 1 puff into the lungs in the morning and at bedtime.     buPROPion (ZYBAN) 150 MG 12 hr tablet Take 150 mg by mouth 2 (two) times daily.     clopidogrel (PLAVIX) 75 MG tablet Take 1 tablet (75 mg total) by mouth daily. 30 tablet 11   levothyroxine (SYNTHROID) 25 MCG tablet Take 25 mcg by mouth every morning.     loratadine (CLARITIN) 10 MG tablet Take 10 mg by mouth daily.     Multiple Vitamins-Minerals (ICAPS AREDS 2) CAPS Take 1 tablet by mouth in the morning and at bedtime.      mupirocin cream (BACTROBAN) 2 % Apply 1 application  topically every Wednesday.     mupirocin ointment (BACTROBAN) 2 % Apply to wound twice a day. 22 g 1   rOPINIRole (REQUIP) 0.5 MG tablet Take 0.5 mg by mouth at bedtime.     SSD 1 % cream Apply topically daily.     traMADol (ULTRAM) 50 MG tablet Take 1 tablet (50 mg total) by mouth every 6 (six) hours as needed. (Patient taking differently: Take  50 mg by mouth every 6 (six) hours as needed for moderate pain.) 30 tablet 0   triamcinolone cream (KENALOG) 0.1 % Apply topically 2 (two) times daily.     vitamin B-12 (CYANOCOBALAMIN) 500 MCG tablet Take 500 mcg by mouth daily.     Vitamin D, Ergocalciferol, (DRISDOL) 1.25 MG (50000 UNIT) CAPS capsule Take 50,000 Units by mouth once a week.     atorvastatin (LIPITOR) 10 MG tablet TAKE 1 TABLET(10 MG) BY MOUTH DAILY (Patient not taking: Reported on 01/04/2022) 30 tablet 11   cetirizine (ZYRTEC) 10 MG tablet Take 10 mg by mouth daily. (Patient not taking: Reported on 01/04/2022)     FEROSUL 325 (65 Fe) MG tablet Take 325 mg by mouth daily. (Patient not taking: Reported on 01/04/2022)     No current  facility-administered medications for this visit.    Review of Systems  Constitutional:  Constitutional negative. HENT: HENT negative.  Eyes: Eyes negative.  Cardiovascular: Cardiovascular negative.  GI: Gastrointestinal negative.  Musculoskeletal: Musculoskeletal negative.  Skin: Positive for wound.  Neurological: Neurological negative. Hematologic: Hematologic/lymphatic negative.  Psychiatric: Psychiatric negative.        Objective:  Objective   Vitals:   02/09/22 1017  BP: (!) 146/64  Pulse: 65  Resp: 20  Temp: 97.9 F (36.6 C)  SpO2: 95%  Weight: 113 lb (51.3 kg)  Height: '5\' 6"'$  (1.676 m)   Body mass index is 18.24 kg/m.  Physical Exam HENT:     Head: Normocephalic.     Nose: Nose normal.  Eyes:     Pupils: Pupils are equal, round, and reactive to light.  Cardiovascular:     Rate and Rhythm: Normal rate.     Pulses:          Popliteal pulses are 2+ on the right side.       Dorsalis pedis pulses are 2+ on the right side and 0 on the left side.       Posterior tibial pulses are 2+ on the right side and 0 on the left side.  Pulmonary:     Effort: Pulmonary effort is normal.  Abdominal:     General: Abdomen is flat.  Skin:    Capillary Refill: Capillary refill takes less than 2 seconds.  Neurological:     General: No focal deficit present.     Mental Status: She is alert.  Psychiatric:        Mood and Affect: Mood normal.        Thought Content: Thought content normal.        Data: ABI Findings:  +---------+------------------+-----+--------+--------+  Right    Rt Pressure (mmHg)IndexWaveformComment   +---------+------------------+-----+--------+--------+  Brachial 151                                      +---------+------------------+-----+--------+--------+  PTA      149               0.99 biphasic          +---------+------------------+-----+--------+--------+  DP       150               0.99 biphasic           +---------+------------------+-----+--------+--------+  Great Toe141               0.93                   +---------+------------------+-----+--------+--------+   +---------+------------------+-----+----------+-------+  Left     Lt Pressure (mmHg)IndexWaveform  Comment  +---------+------------------+-----+----------+-------+  Brachial 146                                       +---------+------------------+-----+----------+-------+  PTA      93                0.62 monophasic         +---------+------------------+-----+----------+-------+  DP       92                0.61 monophasic         +---------+------------------+-----+----------+-------+  Great Toe51                0.34                    +---------+------------------+-----+----------+-------+   +-------+-----------+-----------+------------+------------+  ABI/TBIToday's ABIToday's TBIPrevious ABIPrevious TBI  +-------+-----------+-----------+------------+------------+  Right  0.99       0.93       0.98        0.68          +-------+-----------+-----------+------------+------------+  Left   0.62       0.34       0.68        0.40          +-------+-----------+-----------+------------+------------+          Assessment/Plan:    86 year old female with right SFA stenting which is patent by physical exam today.  She does have worsening wounds as pictured above.  I am sending 10 Percocet to her pharmacy to allow her to get sleep.  We have fitted her for knee-high compression stockings.  We are making referral to the wound care center for medial and lateral malleolar ulceration which appeared to be secondary to swelling possible venous in nature although previous saphenous vein mapping for bypass would not suggest this.  Venous reflux studies ordered.  She will follow-up in 3 months with a PA or Dr. Scot Dock.     Waynetta Sandy MD [facility]

## 2022-02-09 NOTE — Telephone Encounter (Signed)
[name] with [facility] called requesting a verbal order to increase pt's visits to twice weekly for 4 weeks. Verbal order given. Confirmed understanding.

## 2022-02-10 ENCOUNTER — Telehealth: Payer: Self-pay

## 2022-02-10 NOTE — Telephone Encounter (Signed)
[name] with [facility] called to verify current wound orders or obtain new orders since pt was seen in this office on 7/5.  Reviewed pt's chart, returned call, two identifiers used. Informed Threasa Beards that the pt was referred to a wound care clinic. Instructed her to continue current care until given instructions by wound care clinic. Confirmed understanding.

## 2022-02-11 ENCOUNTER — Other Ambulatory Visit: Payer: Self-pay

## 2022-02-12 ENCOUNTER — Emergency Department (HOSPITAL_COMMUNITY): Payer: Medicare Other

## 2022-02-12 ENCOUNTER — Inpatient Hospital Stay (HOSPITAL_COMMUNITY)
Admission: EM | Admit: 2022-02-12 | Discharge: 2022-02-18 | DRG: 603 | Disposition: A | Discharge status: Skilled Nursing Facility | Payer: Medicare Other | Attending: Internal Medicine | Admitting: Internal Medicine

## 2022-02-12 ENCOUNTER — Other Ambulatory Visit: Payer: Self-pay

## 2022-02-12 ENCOUNTER — Encounter (HOSPITAL_COMMUNITY): Payer: Self-pay

## 2022-02-12 DIAGNOSIS — M25551 Pain in right hip: Secondary | ICD-10-CM | POA: Diagnosis present

## 2022-02-12 DIAGNOSIS — E559 Vitamin D deficiency, unspecified: Secondary | ICD-10-CM | POA: Diagnosis present

## 2022-02-12 DIAGNOSIS — Z79899 Other long term (current) drug therapy: Secondary | ICD-10-CM

## 2022-02-12 DIAGNOSIS — D649 Anemia, unspecified: Secondary | ICD-10-CM | POA: Diagnosis present

## 2022-02-12 DIAGNOSIS — Z7951 Long term (current) use of inhaled steroids: Secondary | ICD-10-CM

## 2022-02-12 DIAGNOSIS — E785 Hyperlipidemia, unspecified: Secondary | ICD-10-CM | POA: Diagnosis present

## 2022-02-12 DIAGNOSIS — Z7982 Long term (current) use of aspirin: Secondary | ICD-10-CM

## 2022-02-12 DIAGNOSIS — J9611 Chronic respiratory failure with hypoxia: Secondary | ICD-10-CM | POA: Diagnosis present

## 2022-02-12 DIAGNOSIS — J449 Chronic obstructive pulmonary disease, unspecified: Secondary | ICD-10-CM | POA: Diagnosis present

## 2022-02-12 DIAGNOSIS — L97319 Non-pressure chronic ulcer of right ankle with unspecified severity: Secondary | ICD-10-CM | POA: Diagnosis present

## 2022-02-12 DIAGNOSIS — E538 Deficiency of other specified B group vitamins: Secondary | ICD-10-CM | POA: Diagnosis present

## 2022-02-12 DIAGNOSIS — G2581 Restless legs syndrome: Secondary | ICD-10-CM | POA: Diagnosis present

## 2022-02-12 DIAGNOSIS — Z7902 Long term (current) use of antithrombotics/antiplatelets: Secondary | ICD-10-CM

## 2022-02-12 DIAGNOSIS — Z79890 Hormone replacement therapy (postmenopausal): Secondary | ICD-10-CM

## 2022-02-12 DIAGNOSIS — L03115 Cellulitis of right lower limb: Secondary | ICD-10-CM | POA: Diagnosis not present

## 2022-02-12 DIAGNOSIS — F32A Depression, unspecified: Secondary | ICD-10-CM | POA: Diagnosis present

## 2022-02-12 DIAGNOSIS — F419 Anxiety disorder, unspecified: Secondary | ICD-10-CM | POA: Diagnosis present

## 2022-02-12 DIAGNOSIS — R5381 Other malaise: Secondary | ICD-10-CM | POA: Diagnosis present

## 2022-02-12 DIAGNOSIS — F17210 Nicotine dependence, cigarettes, uncomplicated: Secondary | ICD-10-CM | POA: Diagnosis present

## 2022-02-12 DIAGNOSIS — Z720 Tobacco use: Secondary | ICD-10-CM | POA: Diagnosis present

## 2022-02-12 DIAGNOSIS — Z66 Do not resuscitate: Secondary | ICD-10-CM | POA: Diagnosis present

## 2022-02-12 DIAGNOSIS — E039 Hypothyroidism, unspecified: Secondary | ICD-10-CM | POA: Diagnosis present

## 2022-02-12 DIAGNOSIS — I739 Peripheral vascular disease, unspecified: Secondary | ICD-10-CM | POA: Diagnosis present

## 2022-02-12 LAB — CBC WITH DIFFERENTIAL/PLATELET
Abs Immature Granulocytes: 0.25 10*3/uL — ABNORMAL HIGH (ref 0.00–0.07)
Basophils Absolute: 0 10*3/uL (ref 0.0–0.1)
Basophils Relative: 0 %
Eosinophils Absolute: 0 10*3/uL (ref 0.0–0.5)
Eosinophils Relative: 0 %
HCT: 31.8 % — ABNORMAL LOW (ref 36.0–46.0)
Hemoglobin: 9.9 g/dL — ABNORMAL LOW (ref 12.0–15.0)
Immature Granulocytes: 2 %
Lymphocytes Relative: 5 %
Lymphs Abs: 0.8 10*3/uL (ref 0.7–4.0)
MCH: 30.7 pg (ref 26.0–34.0)
MCHC: 31.1 g/dL (ref 30.0–36.0)
MCV: 98.5 fL (ref 80.0–100.0)
Monocytes Absolute: 1.1 10*3/uL — ABNORMAL HIGH (ref 0.1–1.0)
Monocytes Relative: 6 %
Neutro Abs: 14.8 10*3/uL — ABNORMAL HIGH (ref 1.7–7.7)
Neutrophils Relative %: 87 %
Platelets: 247 10*3/uL (ref 150–400)
RBC: 3.23 MIL/uL — ABNORMAL LOW (ref 3.87–5.11)
RDW: 15.8 % — ABNORMAL HIGH (ref 11.5–15.5)
WBC: 16.9 10*3/uL — ABNORMAL HIGH (ref 4.0–10.5)
nRBC: 0 % (ref 0.0–0.2)

## 2022-02-12 LAB — COMPREHENSIVE METABOLIC PANEL
ALT: 25 U/L (ref 0–44)
AST: 27 U/L (ref 15–41)
Albumin: 3 g/dL — ABNORMAL LOW (ref 3.5–5.0)
Alkaline Phosphatase: 105 U/L (ref 38–126)
Anion gap: 9 (ref 5–15)
BUN: 29 mg/dL — ABNORMAL HIGH (ref 8–23)
CO2: 24 mmol/L (ref 22–32)
Calcium: 8.6 mg/dL — ABNORMAL LOW (ref 8.9–10.3)
Chloride: 106 mmol/L (ref 98–111)
Creatinine, Ser: 0.8 mg/dL (ref 0.44–1.00)
GFR, Estimated: >60 mL/min (ref >60)
Glucose, Bld: 105 mg/dL — ABNORMAL HIGH (ref 70–99)
Potassium: 3.5 mmol/L (ref 3.5–5.1)
Sodium: 139 mmol/L (ref 135–145)
Total Bilirubin: 0.8 mg/dL (ref 0.3–1.2)
Total Protein: 6.9 g/dL (ref 6.5–8.1)

## 2022-02-12 LAB — PROTIME-INR
INR: 1 (ref 0.8–1.2)
Prothrombin Time: 13.1 seconds (ref 11.4–15.2)

## 2022-02-12 LAB — LACTIC ACID, PLASMA: Lactic Acid, Venous: 1.4 mmol/L (ref 0.5–1.9)

## 2022-02-12 MED ORDER — IOHEXOL 300 MG/ML  SOLN
100.0000 mL | Freq: Once | INTRAMUSCULAR | Status: AC | PRN
  Administered 2022-02-12: 80 mL via INTRAVENOUS

## 2022-02-12 MED ORDER — ACETAMINOPHEN 500 MG PO TABS
1000.0000 mg | ORAL_TABLET | Freq: Once | ORAL | Status: AC
  Administered 2022-02-13: 1000 mg via ORAL
  Filled 2022-02-12: qty 2

## 2022-02-12 MED ORDER — OXYCODONE HCL 5 MG PO TABS
5.0000 mg | ORAL_TABLET | Freq: Once | ORAL | Status: AC
  Administered 2022-02-13: 5 mg via ORAL
  Filled 2022-02-12: qty 1

## 2022-02-12 MED ORDER — SODIUM CHLORIDE 0.9 % IV SOLN
2.0000 g | Freq: Once | INTRAVENOUS | Status: AC
  Administered 2022-02-13: 2 g via INTRAVENOUS
  Filled 2022-02-12: qty 20

## 2022-02-12 NOTE — ED Triage Notes (Signed)
BIB EMS with possible wound infection to RLE. Hx of surgery on RLE, seen by wound care for same. When family was changing dressing, noticed swelling, redness, and pus. On 2L chronically. 50 mcg fent PTA.

## 2022-02-12 NOTE — ED Provider Triage Note (Signed)
Emergency Medicine Provider Triage Evaluation Note  Tamara Johnson , a 86 y.o. female  was evaluated in triage.  Pt complains of . Right leg wound red, hot, and purulent drainage today. Patient is not ambulatory because of this. She is on at home 2L oxygen. Denies any fevers at home. Surgery a few months ago for DVT removal.   Went to Wound Care on Wednesday with normal exam.   Gave 157mg of fentanyl. Coming from home.   Review of Systems  Positive:  Negative:   Physical Exam  BP 139/64 (BP Location: Right Arm)   Pulse 86   Temp 98.9 F (37.2 C) (Oral)   Resp (!) 22   SpO2 96%  Gen:   Awake, no distress   Resp:  Normal effort  MSK:   Moves extremities without difficulty  Other:  Erythema, pitting edema, and warmth noted to the RLE below the knee. Significant edema. Dopperable DP and PT pulses. Compartments are soft. Sensation intact.   Medical Decision Making  Medically screening exam initiated at 9:39 PM.  Appropriate orders placed.  KKATRINNA TRAVIESOwas informed that the remainder of the evaluation will be completed by another provider, this initial triage assessment does not replace that evaluation, and the importance of remaining in the ED until their evaluation is complete.  Suspect possible infection, ordering lactic and blood cultures   RSherrell Puller PA-C 02/12/22 2147

## 2022-02-12 NOTE — ED Provider Notes (Signed)
[facility] DEPT Provider Note   CSN: [id number] Arrival date & time: 02/12/22  2112     History {Add pertinent medical, surgical, social history, OB history to HPI:1} No chief complaint on file.   Tamara Johnson is a 86 y.o. female.  HPI  86 year old female that presents the ER today secondary to leg pain.  Her son supplies most of the history secondary to him being a caregiver.  States that she had a stent placed in her right leg by vascular surgery little over a month ago.  Doing okay and wounds were healing fine until maybe about a week ago.  The wound started to have more drainage and elevated redness around it.  She saw a vascular surgeon in the office on July 5 (pictures are in the computer).  And show that she has some surrounding redness and an little bit of purulent drainage on the wound bed.  Son states that in the last 24 to 48 hours the patient had significant worsening of the redness in the leg and drainage.  Had a elevated temperature at home.  Worsening pain as well.  Brought here for further evaluation.  Of note patient's son states that she has enlarging masses in her lungs that was noticed on a CT scan of her chest recently [place].  She supposed to see a pulmonary doctor but has not done so.  The physician there was highly concerned for cancer secondary to the rate of change compared to previous CT scans.    Home Medications Prior to Admission medications   Medication Sig Start Date End Date Taking? Authorizing Provider  acetaminophen (TYLENOL) 500 MG tablet Take 1,000 mg by mouth 2 (two) times daily as needed for moderate pain.    [provider]  ASPIRIN LOW DOSE 81 MG EC tablet TAKE 1 TABLET BY MOUTH EVERY DAY Patient taking differently: Take 81 mg by mouth daily. 09/13/21   Angelia Mould, MD  atorvastatin (LIPITOR) 10 MG tablet Take 1 tablet (10 mg total) by mouth daily. 04/24/20   Setzer, [address], PA-C  atorvastatin  (LIPITOR) 10 MG tablet TAKE 1 TABLET(10 MG) BY MOUTH DAILY Patient not taking: Reported on 01/04/2022 09/14/20   Angelia Mould, MD  Budeson-Glycopyrrol-Formoterol (BREZTRI AEROSPHERE) 160-9-4.8 MCG/ACT AERO Inhale 1 puff into the lungs in the morning and at bedtime.    [provider]  buPROPion (ZYBAN) 150 MG 12 hr tablet Take 150 mg by mouth 2 (two) times daily. 11/04/21   [provider]  cetirizine (ZYRTEC) 10 MG tablet Take 10 mg by mouth daily. Patient not taking: Reported on 01/04/2022    [provider]  clopidogrel (PLAVIX) 75 MG tablet Take 1 tablet (75 mg total) by mouth daily. 11/30/21   Serafina Mitchell, MD  FEROSUL 325 (65 Fe) MG tablet Take 325 mg by mouth daily. Patient not taking: Reported on 01/04/2022 10/16/21   [provider]  levothyroxine (SYNTHROID) 25 MCG tablet Take 25 mcg by mouth every morning. 09/08/21   [provider]  loratadine (CLARITIN) 10 MG tablet Take 10 mg by mouth daily.    [provider]  Multiple Vitamins-Minerals (ICAPS AREDS 2) CAPS Take 1 tablet by mouth in the morning and at bedtime.     [provider]  mupirocin cream (BACTROBAN) 2 % Apply 1 application  topically every Wednesday.    [provider]  mupirocin ointment (BACTROBAN) 2 % Apply to wound twice a day. 06/10/20  Angelia Mould, MD  oxyCODONE-acetaminophen (PERCOCET/ROXICET) 5-325 MG tablet Take 1 tablet by mouth every 4 (four) hours as needed for severe pain. 02/09/22   Waynetta Sandy, MD  rOPINIRole (REQUIP) 0.5 MG tablet Take 0.5 mg by mouth at bedtime. 11/17/21   [provider]  SSD 1 % cream Apply topically daily. 10/05/21   [provider]  traMADol (ULTRAM) 50 MG tablet Take 1 tablet (50 mg total) by mouth every 6 (six) hours as needed. Patient taking differently: Take 50 mg by mouth every 6 (six) hours as needed for moderate pain. 11/30/21   Serafina Mitchell, MD  triamcinolone  cream (KENALOG) 0.1 % Apply topically 2 (two) times daily. 10/29/21   [provider]  vitamin B-12 (CYANOCOBALAMIN) 500 MCG tablet Take 500 mcg by mouth daily.    [provider]  Vitamin D, Ergocalciferol, (DRISDOL) 1.25 MG (50000 UNIT) CAPS capsule Take 50,000 Units by mouth once a week. 09/08/21   [provider]      Allergies    Patient has no known allergies.    Review of Systems   Review of Systems  Physical Exam Updated Vital Signs BP 134/68   Pulse 85   Temp 98.9 F (37.2 C) (Oral)   Resp 15   SpO2 94%  Physical Exam Vitals and nursing note reviewed.  Constitutional:      Appearance: She is well-developed.  HENT:     Head: Normocephalic and atraumatic.     Mouth/Throat:     Mouth: Mucous membranes are moist.     Pharynx: Oropharynx is clear.  Eyes:     Pupils: Pupils are equal, round, and reactive to light.  Cardiovascular:     Rate and Rhythm: Normal rate and regular rhythm.  Pulmonary:     Effort: No respiratory distress.     Breath sounds: No stridor.  Abdominal:     General: There is no distension.  Musculoskeletal:     Cervical back: Normal range of motion.  Skin:    General: Skin is warm and dry.     Comments: Her right lower extremity is significantly edematous at least twice the size of her left with diffuse erythema, tenderness, warmth.  Wounds do not look that much worse than they did on pictures from July 5.  Cannot palpate pulse on that side possibly related to the amount of edema, She Has Good refill although slightly delayed.  Neurological:     Mental Status: She is alert.     ED Results / Procedures / Treatments   Labs (all labs ordered are listed, but only abnormal results are displayed) Labs Reviewed  CBC WITH DIFFERENTIAL/PLATELET - Abnormal; Notable for the following components:      Result Value   WBC 16.9 (*)    RBC 3.23 (*)    Hemoglobin 9.9 (*)    HCT 31.8 (*)    RDW 15.8 (*)    Neutro Abs 14.8 (*)     Monocytes Absolute 1.1 (*)    Abs Immature Granulocytes 0.25 (*)    All other components within normal limits  COMPREHENSIVE METABOLIC PANEL - Abnormal; Notable for the following components:   Glucose, Bld 105 (*)    BUN 29 (*)    Calcium 8.6 (*)    Albumin 3.0 (*)    All other components within normal limits  CULTURE, BLOOD (ROUTINE X 2)  CULTURE, BLOOD (ROUTINE X 2)  LACTIC ACID, PLASMA  PROTIME-INR  LACTIC ACID, PLASMA  URINALYSIS, ROUTINE W REFLEX MICROSCOPIC    EKG None  Radiology DG Tibia/Fibula Right  Result Date: 02/12/2022 CLINICAL DATA:  Wound infection EXAM: RIGHT TIBIA AND FIBULA - 2 VIEW COMPARISON:  01/04/2022 ankle radiograph FINDINGS: No fracture or malalignment. Considerable soft tissue edema. Small ulcers or wounds at the medial and lateral aspect of the ankle. No osseous destructive change. IMPRESSION: No acute osseous abnormality. Considerable soft tissue edema with ulcers or wounds at the medial and lateral aspect of the ankle Electronically Signed   By: Donavan Foil M.D.   On: 02/12/2022 22:19    Procedures Procedures  {Document cardiac monitor, telemetry assessment procedure when appropriate:1}  Medications Ordered in ED Medications  acetaminophen (TYLENOL) tablet 1,000 mg (has no administration in time range)  cefTRIAXone (ROCEPHIN) 2 g in sodium chloride 0.9 % 100 mL IVPB (has no administration in time range)  iohexol (OMNIPAQUE) 300 MG/ML solution 100 mL (has no administration in time range)    ED Course/ Medical Decision Making/ A&P                           Medical Decision Making Amount and/or Complexity of Data Reviewed Radiology: ordered.  Risk OTC drugs. Prescription drug management.  With the way that her leg looks face most likely cellulitis especially with elevated white count and the rapid progression.  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  She would likely need a DVT study but  for now we will start her on some antibiotics and a little bit of fluid, pain medicine blood cultures . Will check ct to ensure no underlying fluid collection requiring surgical drainage and admit to hospitalist. ***  {Document critical care time when appropriate:1} {Document review of labs and clinical decision tools ie heart score, Chads2Vasc2 etc:1}  {Document your independent review of radiology images, and any outside records:1} {Document your discussion with family members, caretakers, and with consultants:1} {Document social determinants of health affecting pt's care:1} {Document your decision making why or why not admission, treatments were needed:1} Final Clinical Impression(s) / ED Diagnoses Final diagnoses:  None    Rx / DC Orders ED Discharge Orders     None

## 2022-02-12 NOTE — ED Notes (Signed)
This staff member heard both pulses on this pts right foot with provider

## 2022-02-12 NOTE — ED Notes (Addendum)
Aware of need for urine sample, will use call button to alert nursing when she would like to use bedpan. Would normally use a walker to ambulate but is not able today d/t pain from wounds and swelling to RLE

## 2022-02-13 DIAGNOSIS — R52 Pain, unspecified: Secondary | ICD-10-CM | POA: Diagnosis not present

## 2022-02-13 DIAGNOSIS — Z7902 Long term (current) use of antithrombotics/antiplatelets: Secondary | ICD-10-CM | POA: Diagnosis not present

## 2022-02-13 DIAGNOSIS — R5381 Other malaise: Secondary | ICD-10-CM | POA: Diagnosis present

## 2022-02-13 DIAGNOSIS — E785 Hyperlipidemia, unspecified: Secondary | ICD-10-CM

## 2022-02-13 DIAGNOSIS — F419 Anxiety disorder, unspecified: Secondary | ICD-10-CM | POA: Diagnosis present

## 2022-02-13 DIAGNOSIS — Z79890 Hormone replacement therapy (postmenopausal): Secondary | ICD-10-CM | POA: Diagnosis not present

## 2022-02-13 DIAGNOSIS — L97319 Non-pressure chronic ulcer of right ankle with unspecified severity: Secondary | ICD-10-CM | POA: Diagnosis present

## 2022-02-13 DIAGNOSIS — Z66 Do not resuscitate: Secondary | ICD-10-CM | POA: Diagnosis present

## 2022-02-13 DIAGNOSIS — E039 Hypothyroidism, unspecified: Secondary | ICD-10-CM

## 2022-02-13 DIAGNOSIS — J449 Chronic obstructive pulmonary disease, unspecified: Secondary | ICD-10-CM

## 2022-02-13 DIAGNOSIS — I739 Peripheral vascular disease, unspecified: Secondary | ICD-10-CM

## 2022-02-13 DIAGNOSIS — L538 Other specified erythematous conditions: Secondary | ICD-10-CM | POA: Diagnosis not present

## 2022-02-13 DIAGNOSIS — J9611 Chronic respiratory failure with hypoxia: Secondary | ICD-10-CM | POA: Diagnosis present

## 2022-02-13 DIAGNOSIS — F17210 Nicotine dependence, cigarettes, uncomplicated: Secondary | ICD-10-CM | POA: Diagnosis present

## 2022-02-13 DIAGNOSIS — L03115 Cellulitis of right lower limb: Secondary | ICD-10-CM

## 2022-02-13 DIAGNOSIS — E538 Deficiency of other specified B group vitamins: Secondary | ICD-10-CM | POA: Diagnosis present

## 2022-02-13 DIAGNOSIS — Z7982 Long term (current) use of aspirin: Secondary | ICD-10-CM | POA: Diagnosis not present

## 2022-02-13 DIAGNOSIS — M25551 Pain in right hip: Secondary | ICD-10-CM | POA: Diagnosis present

## 2022-02-13 DIAGNOSIS — F32A Depression, unspecified: Secondary | ICD-10-CM | POA: Diagnosis present

## 2022-02-13 DIAGNOSIS — G2581 Restless legs syndrome: Secondary | ICD-10-CM | POA: Diagnosis present

## 2022-02-13 DIAGNOSIS — Z7951 Long term (current) use of inhaled steroids: Secondary | ICD-10-CM | POA: Diagnosis not present

## 2022-02-13 DIAGNOSIS — E559 Vitamin D deficiency, unspecified: Secondary | ICD-10-CM | POA: Diagnosis present

## 2022-02-13 DIAGNOSIS — R609 Edema, unspecified: Secondary | ICD-10-CM | POA: Diagnosis not present

## 2022-02-13 DIAGNOSIS — Z79899 Other long term (current) drug therapy: Secondary | ICD-10-CM | POA: Diagnosis not present

## 2022-02-13 DIAGNOSIS — D649 Anemia, unspecified: Secondary | ICD-10-CM | POA: Diagnosis present

## 2022-02-13 LAB — URINALYSIS, ROUTINE W REFLEX MICROSCOPIC
Bacteria, UA: NONE SEEN
Bilirubin Urine: NEGATIVE
Glucose, UA: NEGATIVE mg/dL
Hgb urine dipstick: NEGATIVE
Ketones, ur: NEGATIVE mg/dL
Leukocytes,Ua: NEGATIVE
Nitrite: NEGATIVE
Protein, ur: 100 mg/dL — AB
Specific Gravity, Urine: 1.025 (ref 1.005–1.030)
pH: 5 (ref 5.0–8.0)

## 2022-02-13 LAB — CBC
HCT: 28 % — ABNORMAL LOW (ref 36.0–46.0)
Hemoglobin: 8.6 g/dL — ABNORMAL LOW (ref 12.0–15.0)
MCH: 30.2 pg (ref 26.0–34.0)
MCHC: 30.7 g/dL (ref 30.0–36.0)
MCV: 98.2 fL (ref 80.0–100.0)
Platelets: 230 10*3/uL (ref 150–400)
RBC: 2.85 MIL/uL — ABNORMAL LOW (ref 3.87–5.11)
RDW: 15.7 % — ABNORMAL HIGH (ref 11.5–15.5)
WBC: 15.1 10*3/uL — ABNORMAL HIGH (ref 4.0–10.5)
nRBC: 0 % (ref 0.0–0.2)

## 2022-02-13 LAB — BASIC METABOLIC PANEL
Anion gap: 9 (ref 5–15)
BUN: 28 mg/dL — ABNORMAL HIGH (ref 8–23)
CO2: 22 mmol/L (ref 22–32)
Calcium: 8.2 mg/dL — ABNORMAL LOW (ref 8.9–10.3)
Chloride: 109 mmol/L (ref 98–111)
Creatinine, Ser: 0.76 mg/dL (ref 0.44–1.00)
GFR, Estimated: >60 mL/min (ref >60)
Glucose, Bld: 120 mg/dL — ABNORMAL HIGH (ref 70–99)
Potassium: 3.7 mmol/L (ref 3.5–5.1)
Sodium: 140 mmol/L (ref 135–145)

## 2022-02-13 LAB — LACTIC ACID, PLASMA: Lactic Acid, Venous: 1.7 mmol/L (ref 0.5–1.9)

## 2022-02-13 MED ORDER — FERROUS SULFATE 325 (65 FE) MG PO TABS
325.0000 mg | ORAL_TABLET | Freq: Every day | ORAL | Status: DC
  Administered 2022-02-13 – 2022-02-18 (×6): 325 mg via ORAL
  Filled 2022-02-13 (×6): qty 1

## 2022-02-13 MED ORDER — ATORVASTATIN CALCIUM 10 MG PO TABS
10.0000 mg | ORAL_TABLET | Freq: Every day | ORAL | Status: DC
  Administered 2022-02-13 – 2022-02-18 (×6): 10 mg via ORAL
  Filled 2022-02-13 (×6): qty 1

## 2022-02-13 MED ORDER — OXYCODONE-ACETAMINOPHEN 5-325 MG PO TABS
1.0000 | ORAL_TABLET | ORAL | Status: DC | PRN
  Administered 2022-02-13 – 2022-02-18 (×15): 1 via ORAL
  Filled 2022-02-13 (×15): qty 1

## 2022-02-13 MED ORDER — DIPHENHYDRAMINE HCL 50 MG/ML IJ SOLN
12.5000 mg | Freq: Four times a day (QID) | INTRAMUSCULAR | Status: DC | PRN
Start: 2022-02-13 — End: 2022-02-18

## 2022-02-13 MED ORDER — ENOXAPARIN SODIUM 40 MG/0.4ML IJ SOSY
40.0000 mg | PREFILLED_SYRINGE | INTRAMUSCULAR | Status: DC
Start: 2022-02-13 — End: 2022-02-17
  Administered 2022-02-13 – 2022-02-17 (×5): 40 mg via SUBCUTANEOUS
  Filled 2022-02-13 (×5): qty 0.4

## 2022-02-13 MED ORDER — IPRATROPIUM-ALBUTEROL 0.5-2.5 (3) MG/3ML IN SOLN
3.0000 mL | Freq: Four times a day (QID) | RESPIRATORY_TRACT | Status: DC | PRN
Start: 2022-02-13 — End: 2022-02-18

## 2022-02-13 MED ORDER — SODIUM CHLORIDE 0.9 % IV SOLN
2.0000 g | INTRAVENOUS | Status: DC
  Administered 2022-02-14 – 2022-02-18 (×5): 2 g via INTRAVENOUS
  Filled 2022-02-13 (×5): qty 20

## 2022-02-13 MED ORDER — BUDESON-GLYCOPYRROL-FORMOTEROL 160-9-4.8 MCG/ACT IN AERO: 2.0000 | INHALATION_SPRAY | Freq: Two times a day (BID) | RESPIRATORY_TRACT | Status: DC

## 2022-02-13 MED ORDER — ASPIRIN 81 MG PO TBEC
81.0000 mg | DELAYED_RELEASE_TABLET | Freq: Every day | ORAL | Status: DC
  Administered 2022-02-13 – 2022-02-18 (×6): 81 mg via ORAL
  Filled 2022-02-13 (×6): qty 1

## 2022-02-13 MED ORDER — SODIUM CHLORIDE 0.9 % IV SOLN: INTRAVENOUS | Status: DC

## 2022-02-13 MED ORDER — ACETAMINOPHEN 650 MG RE SUPP: 650.0000 mg | Freq: Four times a day (QID) | RECTAL | Status: DC | PRN

## 2022-02-13 MED ORDER — BUPROPION HCL ER (SR) 150 MG PO TB12
300.0000 mg | ORAL_TABLET | Freq: Every day | ORAL | Status: DC
  Administered 2022-02-13 – 2022-02-18 (×6): 300 mg via ORAL
  Filled 2022-02-13 (×6): qty 2

## 2022-02-13 MED ORDER — CYANOCOBALAMIN 500 MCG PO TABS
500.0000 ug | ORAL_TABLET | Freq: Every day | ORAL | Status: DC
  Administered 2022-02-13 – 2022-02-18 (×6): 500 ug via ORAL
  Filled 2022-02-13 (×6): qty 1

## 2022-02-13 MED ORDER — BUPROPION HCL ER (SMOKING DET) 150 MG PO TB12
300.0000 mg | ORAL_TABLET | Freq: Every day | ORAL | Status: DC
  Filled 2022-02-13: qty 2

## 2022-02-13 MED ORDER — ACETAMINOPHEN 325 MG PO TABS: 650.0000 mg | ORAL_TABLET | Freq: Four times a day (QID) | ORAL | Status: DC | PRN

## 2022-02-13 MED ORDER — VITAMIN D (ERGOCALCIFEROL) 1.25 MG (50000 UNIT) PO CAPS
50000.0000 [IU] | ORAL_CAPSULE | ORAL | Status: DC
Start: 2022-02-20 — End: 2022-02-18

## 2022-02-13 MED ORDER — ALBUTEROL SULFATE HFA 108 (90 BASE) MCG/ACT IN AERS
2.0000 | INHALATION_SPRAY | RESPIRATORY_TRACT | Status: DC | PRN
Start: 2022-02-13 — End: 2022-02-13

## 2022-02-13 MED ORDER — ALBUTEROL SULFATE (2.5 MG/3ML) 0.083% IN NEBU: 2.5000 mg | INHALATION_SOLUTION | RESPIRATORY_TRACT | Status: DC | PRN

## 2022-02-13 MED ORDER — UMECLIDINIUM BROMIDE 62.5 MCG/ACT IN AEPB
1.0000 | INHALATION_SPRAY | Freq: Every day | RESPIRATORY_TRACT | Status: DC
Start: 2022-02-14 — End: 2022-02-18
  Administered 2022-02-14 – 2022-02-18 (×5): 1 via RESPIRATORY_TRACT
  Filled 2022-02-13: qty 7

## 2022-02-13 MED ORDER — CLOPIDOGREL BISULFATE 75 MG PO TABS
75.0000 mg | ORAL_TABLET | Freq: Every day | ORAL | Status: DC
  Administered 2022-02-13 – 2022-02-18 (×6): 75 mg via ORAL
  Filled 2022-02-13 (×6): qty 1

## 2022-02-13 MED ORDER — TRAZODONE HCL 50 MG PO TABS
25.0000 mg | ORAL_TABLET | Freq: Every evening | ORAL | Status: DC | PRN
  Administered 2022-02-13 – 2022-02-16 (×3): 25 mg via ORAL
  Filled 2022-02-13 (×3): qty 1

## 2022-02-13 MED ORDER — ONDANSETRON HCL 4 MG PO TABS: 4.0000 mg | ORAL_TABLET | Freq: Four times a day (QID) | ORAL | Status: DC | PRN

## 2022-02-13 MED ORDER — LEVOTHYROXINE SODIUM 25 MCG PO TABS
25.0000 ug | ORAL_TABLET | Freq: Every morning | ORAL | Status: DC
Start: 2022-02-13 — End: 2022-02-18
  Administered 2022-02-13 – 2022-02-18 (×6): 25 ug via ORAL
  Filled 2022-02-13 (×7): qty 1

## 2022-02-13 MED ORDER — MAGNESIUM HYDROXIDE 400 MG/5ML PO SUSP: 30.0000 mL | Freq: Every day | ORAL | Status: DC | PRN

## 2022-02-13 MED ORDER — MORPHINE SULFATE (PF) 2 MG/ML IV SOLN
2.0000 mg | INTRAVENOUS | Status: DC | PRN
  Administered 2022-02-13 – 2022-02-15 (×7): 2 mg via INTRAVENOUS
  Filled 2022-02-13 (×7): qty 1

## 2022-02-13 MED ORDER — ONDANSETRON HCL 4 MG/2ML IJ SOLN: 4.0000 mg | Freq: Four times a day (QID) | INTRAMUSCULAR | Status: DC | PRN

## 2022-02-13 MED ORDER — LORATADINE 10 MG PO TABS: 10.0000 mg | ORAL_TABLET | Freq: Every day | ORAL | Status: DC

## 2022-02-13 MED ORDER — ROPINIROLE HCL 0.5 MG PO TABS
0.5000 mg | ORAL_TABLET | Freq: Every day | ORAL | Status: DC
  Administered 2022-02-13 – 2022-02-17 (×5): 0.5 mg via ORAL
  Filled 2022-02-13 (×5): qty 1

## 2022-02-13 MED ORDER — MOMETASONE FURO-FORMOTEROL FUM 200-5 MCG/ACT IN AERO
2.0000 | INHALATION_SPRAY | Freq: Two times a day (BID) | RESPIRATORY_TRACT | Status: DC
  Administered 2022-02-13 – 2022-02-18 (×10): 2 via RESPIRATORY_TRACT
  Filled 2022-02-13: qty 8.8

## 2022-02-13 MED ORDER — LORATADINE 10 MG PO TABS
10.0000 mg | ORAL_TABLET | Freq: Every day | ORAL | Status: DC
  Administered 2022-02-13 – 2022-02-18 (×6): 10 mg via ORAL
  Filled 2022-02-13 (×6): qty 1

## 2022-02-13 NOTE — Assessment & Plan Note (Signed)
-   We will continue her inhalers. 

## 2022-02-13 NOTE — Assessment & Plan Note (Signed)
-   We will continue statin therapy. 

## 2022-02-13 NOTE — Assessment & Plan Note (Signed)
-   We will continue Synthroid. 

## 2022-02-13 NOTE — ED Notes (Signed)
Skin marker used to mark borders of redness to RLE, timed and dated.

## 2022-02-13 NOTE — Assessment & Plan Note (Signed)
-   We will continue aspirin and Plavix. 

## 2022-02-13 NOTE — ED Notes (Signed)
Pt noted removing O2 nasal cannula.

## 2022-02-13 NOTE — ED Notes (Signed)
Pt continues to remove nasal cannula stating, "I need to take a break from it, that's why I keep taking it off."

## 2022-02-13 NOTE — H&P (Signed)
[facility]   PATIENT NAME: Tamara Johnson    MR#:  [id number]  DATE OF BIRTH:  07/22/1936  DATE OF ADMISSION:  02/12/2022  PRIMARY CARE PHYSICIAN: Bonnita Nasuti, MD   Patient is coming from: Home  REQUESTING/REFERRING PHYSICIAN: Mesner, Corene Cornea, MD   CHIEF COMPLAINT:  Worsening right leg pain   HISTORY OF PRESENT ILLNESS:  Tamara Johnson is a 86 y.o. female with medical history significant for peripheral vascular disease status post right leg stent, who presented to the emergency room with a [place] of worsening right lower extremity swelling associated erythema and tenderness extending from her foot to her knee.  She denies any fever but had chills today.  She had nausea and vomiting on Thursday.  No dysuria, oliguria or hematuria or flank pain.  She ambulates with a walker.  She is on home O2 for her COPD with 2 L/min nasal cannula.  ED Course: When she came to the ER, BP was 142/74 with otherwise normal vital signs and later on respirate was 22.  Labs revealed a BUN of 29 and potassium of 3.5 with albumin of 3 with otherwise unremarkable CMP.  Lactic acid was 1.4 and CBC showed leukocytosis 16.9 with neutrophilia and anemia close to baseline.  Imaging: Right hip/fib x-ray showed no acute osseous abnormality.  Showed considerable soft tissue edema with ulcers or wounds at the medial and lateral aspect of the ankle. CT of the tibia and fibula revealed diffuse lower extremity edema with differential diagnoses include infectious or inflammatory etiology with no abscess.  There was no acute acute osseous abnormality.  The patient was given 2 g of IV Rocephin, 5 mg of p.o. oxycodone and 1 g of p.o. Tylenol.  She will be admitted to a medical bed for further evaluation and management. PAST MEDICAL HISTORY:   Past Medical History:  Diagnosis Date   Cellulitis   -Peripheral vascular disease  PAST SURGICAL HISTORY:   Past Surgical History:  Procedure Laterality Date   ABDOMINAL  AORTOGRAM W/LOWER EXTREMITY Bilateral 10/04/2019   Procedure: ABDOMINAL AORTOGRAM W/LOWER EXTREMITY;  Surgeon: Angelia Mould, MD;  Location: [facility];  Service: Cardiovascular;  Laterality: Bilateral;   ABDOMINAL AORTOGRAM W/LOWER EXTREMITY N/A 11/30/2021   Procedure: ABDOMINAL AORTOGRAM W/LOWER EXTREMITY;  Surgeon: Serafina Mitchell, MD;  Location: [facility];  Service: Cardiovascular;  Laterality: N/A;   PERIPHERAL VASCULAR INTERVENTION  11/30/2021   Procedure: PERIPHERAL VASCULAR INTERVENTION;  Surgeon: Serafina Mitchell, MD;  Location: [facility];  Service: Cardiovascular;;   PR VEIN BYPASS GRAFT,AORTO-FEM-POP      SOCIAL HISTORY:   Social History   Tobacco Use   Smoking status: Every Day    Packs/day: 1.00    Types: Cigarettes    Passive exposure: Never   Smokeless tobacco: Never  Substance Use Topics   Alcohol use: No    Alcohol/week: 0.0 standard drinks of alcohol    FAMILY HISTORY:   Family History  Problem Relation Age of Onset   Heart attack Father     DRUG ALLERGIES:  No Known Allergies  REVIEW OF SYSTEMS:   ROS As per history of present illness. All pertinent systems were reviewed above. Constitutional, HEENT, cardiovascular, respiratory, GI, GU, musculoskeletal, neuro, psychiatric, endocrine, integumentary and hematologic systems were reviewed and are otherwise negative/unremarkable except for positive findings mentioned above in the HPI.   MEDICATIONS AT HOME:   Prior to Admission medications   Medication Sig Start  Date End Date Taking? Authorizing Provider  acetaminophen (TYLENOL) 500 MG tablet Take 1,000 mg by mouth 2 (two) times daily.   Yes [provider]  albuterol (VENTOLIN HFA) 108 (90 Base) MCG/ACT inhaler Inhale 2 puffs into the lungs every 4 (four) hours as needed for shortness of breath or wheezing. 01/19/22  Yes [provider]  ASPIRIN LOW DOSE 81 MG EC tablet TAKE 1 TABLET BY MOUTH EVERY  DAY Patient taking differently: Take 81 mg by mouth daily. 09/13/21  Yes Angelia Mould, MD  atorvastatin (LIPITOR) 10 MG tablet TAKE 1 TABLET(10 MG) BY MOUTH DAILY Patient taking differently: Take 10 mg by mouth daily. 09/14/20  Yes Angelia Mould, MD  Budeson-Glycopyrrol-Formoterol (BREZTRI AEROSPHERE) 160-9-4.8 MCG/ACT AERO Inhale 2 puffs into the lungs in the morning and at bedtime.   Yes [provider]  buPROPion (ZYBAN) 150 MG 12 hr tablet Take 300 mg by mouth daily at 12 noon. 11/04/21  Yes [provider]  cetirizine (ZYRTEC) 10 MG tablet Take 10 mg by mouth daily.   Yes [provider]  clopidogrel (PLAVIX) 75 MG tablet Take 1 tablet (75 mg total) by mouth daily. 11/30/21  Yes Serafina Mitchell, MD  FEROSUL 325 (65 Fe) MG tablet Take 325 mg by mouth daily. 10/16/21  Yes [provider]  ipratropium-albuterol (DUONEB) 0.5-2.5 (3) MG/3ML SOLN Take 3 mLs by nebulization every 6 (six) hours as needed. 01/19/22  Yes [provider]  levothyroxine (SYNTHROID) 25 MCG tablet Take 25 mcg by mouth every morning. 09/08/21  Yes [provider]  loratadine (CLARITIN) 10 MG tablet Take 10 mg by mouth daily.   Yes [provider]  Multiple Vitamins-Minerals (ICAPS AREDS 2) CAPS Take 1 tablet by mouth in the morning and at bedtime.    Yes [provider]  rOPINIRole (REQUIP) 0.5 MG tablet Take 0.5 mg by mouth at bedtime. 11/17/21  Yes [provider]  SANTYL 250 UNIT/GM ointment Apply 1 Application topically 2 (two) times daily. 01/23/22  Yes [provider]  vitamin B-12 (CYANOCOBALAMIN) 500 MCG tablet Take 500 mcg by mouth daily.   Yes [provider]  Vitamin D, Ergocalciferol, (DRISDOL) 1.25 MG (50000 UNIT) CAPS capsule Take 50,000 Units by mouth once a week. 09/08/21  Yes [provider]  oxyCODONE-acetaminophen (PERCOCET/ROXICET) 5-325 MG tablet Take 1 tablet by mouth every 4 (four) hours as  needed for severe pain. Patient not taking: Reported on 02/13/2022 02/09/22   Waynetta Sandy, MD  predniSONE (DELTASONE) 20 MG tablet Take 20 mg by mouth 2 (two) times daily. Patient not taking: Reported on 02/13/2022 02/11/22   [provider]      VITAL SIGNS:  Blood pressure 105/77, pulse 90, temperature 98.9 F (37.2 C), temperature source Oral, resp. rate (!) 24, SpO2 96 %.  PHYSICAL EXAMINATION:  Physical Exam  GENERAL:  86 y.o.-year-old Caucasian female patient lying in the bed with no acute distress.  EYES: Pupils equal, round, reactive to light and accommodation. No scleral icterus. Extraocular muscles intact.  HEENT: Head atraumatic, normocephalic. Oropharynx and nasopharynx clear.  NECK:  Supple, no jugular venous distention. No thyroid enlargement, no tenderness.  LUNGS: Normal breath sounds bilaterally, no wheezing, rales,rhonchi or crepitation. No use of accessory muscles of respiration.  CARDIOVASCULAR: Regular rate and rhythm, S1, S2 normal. No murmurs, rubs, or gallops.  ABDOMEN: Soft, nondistended, nontender. Bowel sounds present. No organomegaly or mass.  EXTREMITIES: Left lower extremity generalized swelling with erythema with demarcation  close to the knee and at the ankle and tenderness, warmth and soft pitting to 3+ edema extending from the ankle to the knee.  No cyanosis, or clubbing.  NEUROLOGIC: Cranial nerves II through XII are intact. Muscle strength 5/5 in all extremities. Sensation intact. Gait not checked.  PSYCHIATRIC: The patient is alert and oriented x 3.  Normal affect and good eye contact. SKIN: As above with no other significant rashes.     LABORATORY PANEL:   CBC Recent Labs  Lab 02/12/22 2156  WBC 16.9*  HGB 9.9*  HCT 31.8*  PLT 247   ------------------------------------------------------------------------------------------------------------------  Chemistries  Recent Labs  Lab 02/12/22 2156  NA 139  K 3.5  CL 106  CO2  24  GLUCOSE 105*  BUN 29*  CREATININE 0.80  CALCIUM 8.6*  AST 27  ALT 25  ALKPHOS 105  BILITOT 0.8   ------------------------------------------------------------------------------------------------------------------  Cardiac Enzymes No results for input(s): "TROPONINI" in the last 168 hours. ------------------------------------------------------------------------------------------------------------------  RADIOLOGY:  CT TIBIA FIBULA RIGHT W CONTRAST  Result Date: 02/13/2022 CLINICAL DATA:  Soft tissue infection suspected, lower leg. EXAM: CT OF THE LOWER RIGHT EXTREMITY WITH CONTRAST TECHNIQUE: Multidetector CT imaging of the lower right extremity was performed according to the standard protocol following intravenous contrast administration. RADIATION DOSE REDUCTION: This exam was performed according to the departmental dose-optimization program which includes automated exposure control, adjustment of the mA and/or kV according to patient size and/or use of iterative reconstruction technique. CONTRAST:  2m OMNIPAQUE IOHEXOL 300 MG/ML  SOLN COMPARISON:  02/12/2022. FINDINGS: Bones/Joint/Cartilage No acute fracture or dislocation. Mild-to-moderate degenerative changes are noted at the knee. Minimal calcaneal spurring is present. No periosteal elevation or bony erosion. Evaluation of the digits is limited due to osteopenia. Ligaments Suboptimally assessed by CT. Muscles and Tendons No definite intramuscular edema or abnormal enhancement. Soft tissues Diffuse subcutaneous edema is noted. The vasculature appears patent. No abscess or focal fluid collection. No abnormal enhancement. Multiple varicose veins are noted. IMPRESSION: 1. Diffuse lower extremity edema which may be infectious or inflammatory. No abscess or abnormal enhancement. 2. No acute osseous abnormality. Electronically Signed   By: LBrett FairyM.D.   On: 02/13/2022 00:34   DG Tibia/Fibula Right  Result Date: 02/12/2022 CLINICAL  DATA:  Wound infection EXAM: RIGHT TIBIA AND FIBULA - 2 VIEW COMPARISON:  01/04/2022 ankle radiograph FINDINGS: No fracture or malalignment. Considerable soft tissue edema. Small ulcers or wounds at the medial and lateral aspect of the ankle. No osseous destructive change. IMPRESSION: No acute osseous abnormality. Considerable soft tissue edema with ulcers or wounds at the medial and lateral aspect of the ankle Electronically Signed   By: KDonavan FoilM.D.   On: 02/12/2022 22:19      IMPRESSION AND PLAN:  Assessment and Plan: * Cellulitis of right lower extremity - The patient will be admitted to a medical bed. - We will continue antibiotic therapy with IV Rocephin. - Warm compresses will be utilized. - Pain management will be provided. - We will obtain venous Doppler to rule out DVT.  PAD (peripheral artery disease) (HCC) - We will continue aspirin and Plavix.  Hypothyroidism - We will continue Synthroid.  Chronic obstructive pulmonary disease (COPD) (HCC) - We will continue her inhalers.  Dyslipidemia - We will continue statin therapy     DVT prophylaxis: Lovenox. Advanced Care Planning:  Code Status: The patient is DNR/DNI.  This was discussed with her. Family Communication:  The plan of care was discussed in details with  the patient (and family). I answered all questions. The patient agreed to proceed with the above mentioned plan. Further management will depend upon hospital course. Disposition Plan: Back to previous home environment Consults called: none.  Vascular surgery follow-up can be called in a.m. All the records are reviewed and case discussed with ED provider.  Status is: Inpatient    At the time of the admission, it appears that the appropriate admission status for this patient is inpatient.  This is judged to be reasonable and necessary in order to provide the required intensity of service to ensure the patient's safety given the presenting symptoms, physical  exam findings and initial radiographic and laboratory data in the context of comorbid conditions.  The patient requires inpatient status due to high intensity of service, high risk of further deterioration and high frequency of surveillance required.  I certify that at the time of admission, it is my clinical judgment that the patient will require inpatient hospital care extending more than 2 midnights.                            Dispo: The patient is from: Home              Anticipated d/c is to: Home              Patient currently is not medically stable to d/c.              Difficult to place patient: No  Christel Mormon M.D on 02/13/2022 at 2:37 AM  [facility]   From 7 PM-7 AM, contact night-coverage [email or web address]  CC: Primary care physician; Bonnita Nasuti, MD

## 2022-02-13 NOTE — Assessment & Plan Note (Addendum)
-   The patient will be admitted to a medical bed. - We will continue antibiotic therapy with IV Rocephin. - Warm compresses will be utilized. - Pain management will be provided. - We will obtain venous Doppler to rule out DVT. - Vascular surgery follow-up can be called in AM.

## 2022-02-13 NOTE — Progress Notes (Signed)
PROGRESS NOTE    Tamara Johnson  NAT:[id number] DOB: 28-May-1936 DOA: 02/12/2022 PCP: Bonnita Nasuti, MD   Brief Narrative:  Tamara Johnson is a 86 y.o. female with medical history significant for peripheral vascular disease status post right leg stent, who presented to the emergency room with a [place] of worsening right lower extremity swelling associated erythema and tenderness extending from her foot to her knee.  She denies any fever but had chills today.  She had nausea and vomiting on Thursday.  No dysuria, oliguria or hematuria or flank pain.  She ambulates with a walker.  She is on home O2 for her COPD with 2 L/min nasal cannula.  ED Course: When she came to the ER, BP was 142/74 with otherwise normal vital signs and later on respirate was 22.  Labs revealed a BUN of 29 and potassium of 3.5 with albumin of 3 with otherwise unremarkable CMP.  Lactic acid was 1.4 and CBC showed leukocytosis 16.9 with neutrophilia and anemia close to baseline.   Imaging: Right hip/fib x-ray showed no acute osseous abnormality.  Showed considerable soft tissue edema with ulcers or wounds at the medial and lateral aspect of the ankle. CT of the tibia and fibula revealed diffuse lower extremity edema with differential diagnoses include infectious or inflammatory etiology with no abscess.  There was no acute acute osseous abnormality   Assessment & Plan:   Principal Problem:   Cellulitis of right lower extremity Active Problems:   PAD (peripheral artery disease) (HCC)   Hypothyroidism   Tobacco abuse   Dyslipidemia   Chronic obstructive pulmonary disease (COPD) (HCC)   Cellulitis of right lower extremity - The patient wAS admitted to a medical bed-STILL AWAITING BED - We will continue antibiotic therapy with IV Rocephin. - Warm compresses will be utilized. - Pain management will be provided, ADDED IV MORPHINE AND BENADRYL THIS AM - Pending venous Doppler to rule out DVT.   PAD (peripheral artery  disease) (HCC) - We will continue aspirin and Plavix.   Hypothyroidism - We will continue Synthroid.   Chronic obstructive pulmonary disease (COPD) (HCC) - We will continue her inhalers.   Dyslipidemia - We will continue statin therapy  DVT prophylaxis: Lovenox SQ  Code Status: DNR/DNI    Code Status Orders  (From admission, onward)           Start     Ordered   02/13/22 0212  Do not attempt resuscitation (DNR)  Continuous       Question Answer Comment  In the event of cardiac or respiratory ARREST Do not call a "code blue"   In the event of cardiac or respiratory ARREST Do not perform Intubation, CPR, defibrillation or ACLS   In the event of cardiac or respiratory ARREST Use medication by any route, position, wound care, and other measures to relive pain and suffering. May use oxygen, suction and manual treatment of airway obstruction as needed for comfort.      02/13/22 0214           Code Status History     Date Active Date Inactive Code Status Order ID Comments User Context   01/04/2022 2037 01/10/2022 2302 DNR [id number]  Toy Baker, MD ED   11/30/2021 1326 11/30/2021 2204 Full Code [id number]  Serafina Mitchell, MD Inpatient   10/04/2019 1146 10/04/2019 1826 Full Code [id number]  Angelia Mould, MD Inpatient      Family Communication: TRIED CALLING SON, NA LM  Disposition  Plan:   PT WILL BE CONTINUED ON IV ABX, NOT READY FOR D/C Consults called: None Admission status: Inpatient   Consultants:  None  Procedures:  CT TIBIA FIBULA RIGHT W CONTRAST  Result Date: 02/13/2022 CLINICAL DATA:  Soft tissue infection suspected, lower leg. EXAM: CT OF THE LOWER RIGHT EXTREMITY WITH CONTRAST TECHNIQUE: Multidetector CT imaging of the lower right extremity was performed according to the standard protocol following intravenous contrast administration. RADIATION DOSE REDUCTION: This exam was performed according to the departmental dose-optimization program which  includes automated exposure control, adjustment of the mA and/or kV according to patient size and/or use of iterative reconstruction technique. CONTRAST:  61m OMNIPAQUE IOHEXOL 300 MG/ML  SOLN COMPARISON:  02/12/2022. FINDINGS: Bones/Joint/Cartilage No acute fracture or dislocation. Mild-to-moderate degenerative changes are noted at the knee. Minimal calcaneal spurring is present. No periosteal elevation or bony erosion. Evaluation of the digits is limited due to osteopenia. Ligaments Suboptimally assessed by CT. Muscles and Tendons No definite intramuscular edema or abnormal enhancement. Soft tissues Diffuse subcutaneous edema is noted. The vasculature appears patent. No abscess or focal fluid collection. No abnormal enhancement. Multiple varicose veins are noted. IMPRESSION: 1. Diffuse lower extremity edema which may be infectious or inflammatory. No abscess or abnormal enhancement. 2. No acute osseous abnormality. Electronically Signed   By: LBrett FairyM.D.   On: 02/13/2022 00:34   DG Tibia/Fibula Right  Result Date: 02/12/2022 CLINICAL DATA:  Wound infection EXAM: RIGHT TIBIA AND FIBULA - 2 VIEW COMPARISON:  01/04/2022 ankle radiograph FINDINGS: No fracture or malalignment. Considerable soft tissue edema. Small ulcers or wounds at the medial and lateral aspect of the ankle. No osseous destructive change. IMPRESSION: No acute osseous abnormality. Considerable soft tissue edema with ulcers or wounds at the medial and lateral aspect of the ankle Electronically Signed   By: KDonavan FoilM.D.   On: 02/12/2022 22:19   VAS [place] WITH/WO TBI  Result Date: 02/09/2022  LOWER EXTREMITY DOPPLER STUDY Patient Name:  Tamara Johnson Date of Exam:   02/09/2022 Medical Rec #: [id number]      Accession #:    [id number] of Birth: 1Dec 25, 1937      Patient Gender: F Patient Age:   89years [facility] Procedure:      VAS [place] WITH/WO TBI Referring Phys: SAldona BarRHYNE  --------------------------------------------------------------------------------  Indications: Ulceration, and peripheral artery disease. High Risk Factors: Current smoker.  Vascular Interventions: 11/30/2021: Right SFA stent. Comparison Study: 12/20/2021: Rt ABI 0.98; Lt ABI 0.68 Performing Technologist: MIvan Croft Examination Guidelines: A complete evaluation includes at minimum, Doppler waveform signals and systolic blood pressure reading at the level of bilateral brachial, anterior tibial, and posterior tibial arteries, when vessel segments are accessible. Bilateral testing is considered an integral part of a complete examination. Photoelectric Plethysmograph (PPG) waveforms and toe systolic pressure readings are included as required and additional duplex testing as needed. Limited examinations for reoccurring indications may be performed as noted.  ABI Findings: +---------+------------------+-----+--------+--------+ Right    Rt Pressure (mmHg)IndexWaveformComment  +---------+------------------+-----+--------+--------+ Brachial 151                                     +---------+------------------+-----+--------+--------+ PTA      149               0.99 biphasic         +---------+------------------+-----+--------+--------+  DP       150               0.99 biphasic         +---------+------------------+-----+--------+--------+ Great Toe141               0.93                  +---------+------------------+-----+--------+--------+ +---------+------------------+-----+----------+-------+ Left     Lt Pressure (mmHg)IndexWaveform  Comment +---------+------------------+-----+----------+-------+ Brachial 146                                      +---------+------------------+-----+----------+-------+ PTA      93                0.62 monophasic        +---------+------------------+-----+----------+-------+ DP       92                0.61 monophasic         +---------+------------------+-----+----------+-------+ Great Toe51                0.34                   +---------+------------------+-----+----------+-------+ +-------+-----------+-----------+------------+------------+ ABI/TBIToday's ABIToday's TBIPrevious ABIPrevious TBI +-------+-----------+-----------+------------+------------+ Right  0.99       0.93       0.98        0.68         +-------+-----------+-----------+------------+------------+ Left   0.62       0.34       0.68        0.40         +-------+-----------+-----------+------------+------------+   Summary: Right: Resting right ankle-brachial index is within normal range. No evidence of significant right lower extremity arterial disease. The right toe-brachial index is normal. Left: Resting left ankle-brachial index indicates moderate left lower extremity arterial disease. The left toe-brachial index is abnormal. *See table(s) above for measurements and observations.  Electronically signed by Servando Snare MD on 02/09/2022 at 1:21:26 PM.    Final     Antimicrobials:  ctx    Subjective: Pt reports pain on le this am, adde div pain meds  Objective: Vitals:   02/13/22 1230 02/13/22 1315 02/13/22 1400 02/13/22 1415  BP: 126/65 131/65 130/61 139/63  Pulse: 90 96 95 95  Resp: 17 19 (!) 21 20  Temp:      TempSrc:      SpO2: 96% 95% 100% 98%  Weight:      Height:        Intake/Output Summary (Last 24 hours) at 02/13/2022 1448 Last data filed at 02/13/2022 0200 Gross per 24 hour  Intake 100 ml  Output --  Net 100 ml   Filed Weights   02/13/22 0357  Weight: 53 kg    Examination:  General exam: Appears calm and comfortable  Respiratory system: Clear to auscultation. Respiratory effort normal. Cardiovascular system: S1 & S2 heard, RRR. No JVD, murmurs, rubs, gallops or clicks. No pedal edema. Gastrointestinal system: Abdomen is nondistended, soft and nontender. No organomegaly or masses felt. Normal bowel sounds  heard. Central nervous system: Alert and oriented. No focal neurological deficits. Extremities:RLE with significant warmth and erythema, not extending beyond marking Skin: No rashes, lesions or ulcers Psychiatry: Judgement and insight appear normal. Mood & affect appropriate.     Data Reviewed: I have personally reviewed following labs and imaging  studies  CBC: Recent Labs  Lab 02/12/22 2156 02/13/22 0300  WBC 16.9* 15.1*  NEUTROABS 14.8*  --   HGB 9.9* 8.6*  HCT 31.8* 28.0*  MCV 98.5 98.2  PLT 247 956   Basic Metabolic Panel: Recent Labs  Lab 02/12/22 2156 02/13/22 0300  NA 139 140  K 3.5 3.7  CL 106 109  CO2 24 22  GLUCOSE 105* 120*  BUN 29* 28*  CREATININE 0.80 0.76  CALCIUM 8.6* 8.2*   GFR: Estimated Creatinine Clearance: 44.6 mL/min (by C-G formula based on SCr of 0.76 mg/dL). Liver Function Tests: Recent Labs  Lab 02/12/22 2156  AST 27  ALT 25  ALKPHOS 105  BILITOT 0.8  PROT 6.9  ALBUMIN 3.0*   No results for input(s): "LIPASE", "AMYLASE" in the last 168 hours. No results for input(s): "AMMONIA" in the last 168 hours. Coagulation Profile: Recent Labs  Lab 02/12/22 2156  INR 1.0   Cardiac Enzymes: No results for input(s): "CKTOTAL", "CKMB", "CKMBINDEX", "TROPONINI" in the last 168 hours. BNP (last 3 results) No results for input(s): "PROBNP" in the last 8760 hours. HbA1C: No results for input(s): "HGBA1C" in the last 72 hours. CBG: No results for input(s): "GLUCAP" in the last 168 hours. Lipid Profile: No results for input(s): "CHOL", "HDL", "LDLCALC", "TRIG", "CHOLHDL", "LDLDIRECT" in the last 72 hours. Thyroid Function Tests: No results for input(s): "TSH", "T4TOTAL", "FREET4", "T3FREE", "THYROIDAB" in the last 72 hours. Anemia Panel: No results for input(s): "VITAMINB12", "FOLATE", "FERRITIN", "TIBC", "IRON", "RETICCTPCT" in the last 72 hours. Sepsis Labs: Recent Labs  Lab 02/12/22 0300 02/12/22 2156  LATICACIDVEN 1.7 1.4    No  results found for this or any previous visit (from the past 240 hour(s)).       Radiology Studies: CT TIBIA FIBULA RIGHT W CONTRAST  Result Date: 02/13/2022 CLINICAL DATA:  Soft tissue infection suspected, lower leg. EXAM: CT OF THE LOWER RIGHT EXTREMITY WITH CONTRAST TECHNIQUE: Multidetector CT imaging of the lower right extremity was performed according to the standard protocol following intravenous contrast administration. RADIATION DOSE REDUCTION: This exam was performed according to the departmental dose-optimization program which includes automated exposure control, adjustment of the mA and/or kV according to patient size and/or use of iterative reconstruction technique. CONTRAST:  73m OMNIPAQUE IOHEXOL 300 MG/ML  SOLN COMPARISON:  02/12/2022. FINDINGS: Bones/Joint/Cartilage No acute fracture or dislocation. Mild-to-moderate degenerative changes are noted at the knee. Minimal calcaneal spurring is present. No periosteal elevation or bony erosion. Evaluation of the digits is limited due to osteopenia. Ligaments Suboptimally assessed by CT. Muscles and Tendons No definite intramuscular edema or abnormal enhancement. Soft tissues Diffuse subcutaneous edema is noted. The vasculature appears patent. No abscess or focal fluid collection. No abnormal enhancement. Multiple varicose veins are noted. IMPRESSION: 1. Diffuse lower extremity edema which may be infectious or inflammatory. No abscess or abnormal enhancement. 2. No acute osseous abnormality. Electronically Signed   By: LBrett FairyM.D.   On: 02/13/2022 00:34   DG Tibia/Fibula Right  Result Date: 02/12/2022 CLINICAL DATA:  Wound infection EXAM: RIGHT TIBIA AND FIBULA - 2 VIEW COMPARISON:  01/04/2022 ankle radiograph FINDINGS: No fracture or malalignment. Considerable soft tissue edema. Small ulcers or wounds at the medial and lateral aspect of the ankle. No osseous destructive change. IMPRESSION: No acute osseous abnormality. Considerable soft  tissue edema with ulcers or wounds at the medial and lateral aspect of the ankle Electronically Signed   By: KDonavan FoilM.D.   On: 02/12/2022 22:19  Scheduled Meds:  aspirin EC  81 mg Oral Daily   atorvastatin  10 mg Oral Daily   Budeson-Glycopyrrol-Formoterol  2 puff Inhalation BID   buPROPion ER  300 mg Oral Daily   clopidogrel  75 mg Oral Daily   enoxaparin (LOVENOX) injection  40 mg Subcutaneous Q24H   ferrous sulfate  325 mg Oral Q breakfast   levothyroxine  25 mcg Oral q morning   loratadine  10 mg Oral Daily   rOPINIRole  0.5 mg Oral QHS   vitamin B-12  500 mcg Oral Daily   [START ON 02/20/2022] Vitamin D (Ergocalciferol)  50,000 Units Oral Weekly   Continuous Infusions:  sodium chloride 125 mL/hr at 02/13/22 0256   cefTRIAXone (ROCEPHIN)  IV       LOS: 0 days    Time spent: .35 min    Nicolette Bang, MD [facility]  If 7PM-7AM, please contact night-coverage  02/13/2022, 2:48 PM

## 2022-02-14 ENCOUNTER — Ambulatory Visit: Payer: Medicare Other

## 2022-02-14 ENCOUNTER — Inpatient Hospital Stay (HOSPITAL_COMMUNITY): Payer: Medicare Other

## 2022-02-14 ENCOUNTER — Encounter (HOSPITAL_COMMUNITY): Payer: Medicare Other

## 2022-02-14 DIAGNOSIS — R609 Edema, unspecified: Secondary | ICD-10-CM | POA: Diagnosis not present

## 2022-02-14 DIAGNOSIS — L538 Other specified erythematous conditions: Secondary | ICD-10-CM | POA: Diagnosis not present

## 2022-02-14 DIAGNOSIS — R52 Pain, unspecified: Secondary | ICD-10-CM

## 2022-02-14 DIAGNOSIS — J449 Chronic obstructive pulmonary disease, unspecified: Secondary | ICD-10-CM | POA: Diagnosis not present

## 2022-02-14 DIAGNOSIS — I739 Peripheral vascular disease, unspecified: Secondary | ICD-10-CM | POA: Diagnosis not present

## 2022-02-14 DIAGNOSIS — L03115 Cellulitis of right lower limb: Secondary | ICD-10-CM | POA: Diagnosis not present

## 2022-02-14 DIAGNOSIS — E039 Hypothyroidism, unspecified: Secondary | ICD-10-CM | POA: Diagnosis not present

## 2022-02-14 LAB — CBC WITH DIFFERENTIAL/PLATELET
Abs Immature Granulocytes: 0.08 10*3/uL — ABNORMAL HIGH (ref 0.00–0.07)
Basophils Absolute: 0 10*3/uL (ref 0.0–0.1)
Basophils Relative: 0 %
Eosinophils Absolute: 0 10*3/uL (ref 0.0–0.5)
Eosinophils Relative: 0 %
HCT: 29.1 % — ABNORMAL LOW (ref 36.0–46.0)
Hemoglobin: 8.8 g/dL — ABNORMAL LOW (ref 12.0–15.0)
Immature Granulocytes: 1 %
Lymphocytes Relative: 8 %
Lymphs Abs: 1.1 10*3/uL (ref 0.7–4.0)
MCH: 29.7 pg (ref 26.0–34.0)
MCHC: 30.2 g/dL (ref 30.0–36.0)
MCV: 98.3 fL (ref 80.0–100.0)
Monocytes Absolute: 1.7 10*3/uL — ABNORMAL HIGH (ref 0.1–1.0)
Monocytes Relative: 12 %
Neutro Abs: 10.8 10*3/uL — ABNORMAL HIGH (ref 1.7–7.7)
Neutrophils Relative %: 79 %
Platelets: 246 10*3/uL (ref 150–400)
RBC: 2.96 MIL/uL — ABNORMAL LOW (ref 3.87–5.11)
RDW: 16 % — ABNORMAL HIGH (ref 11.5–15.5)
WBC: 13.6 10*3/uL — ABNORMAL HIGH (ref 4.0–10.5)
nRBC: 0 % (ref 0.0–0.2)

## 2022-02-14 LAB — BASIC METABOLIC PANEL
Anion gap: 9 (ref 5–15)
BUN: 22 mg/dL (ref 8–23)
CO2: 23 mmol/L (ref 22–32)
Calcium: 7.8 mg/dL — ABNORMAL LOW (ref 8.9–10.3)
Chloride: 106 mmol/L (ref 98–111)
Creatinine, Ser: 0.7 mg/dL (ref 0.44–1.00)
GFR, Estimated: >60 mL/min (ref >60)
Glucose, Bld: 89 mg/dL (ref 70–99)
Potassium: 3.9 mmol/L (ref 3.5–5.1)
Sodium: 138 mmol/L (ref 135–145)

## 2022-02-14 NOTE — Progress Notes (Signed)
PROGRESS NOTE    Tamara Johnson  IDP:[id number] DOB: [birth year] DOA: 02/12/2022 PCP: Bonnita Nasuti, MD   Brief Narrative:  Tamara Johnson is a 86 y.o. female with medical history significant for peripheral vascular disease status post right leg stent, who presented to the emergency room with a [place] of worsening right lower extremity swelling associated erythema and tenderness extending from her foot to her knee.  She denies any fever but had chills today.  She had nausea and vomiting on Thursday.  No dysuria, oliguria or hematuria or flank pain.  She ambulates with a walker.  She is on home O2 for her COPD with 2 L/min nasal cannula.  Pt has significant LE cellultitis, limiting ambulation.  Requeste3d PT eval which is pending.  Doppler neg Pt complained of right hip pain:   Right hip/fib x-ray showed no acute osseous abnormality.  Showed considerable soft tissue edema with ulcers or wounds at the medial and lateral aspect of the ankle. CT of the tibia and fibula revealed diffuse lower extremity edema with differential diagnoses include infectious or inflammatory etiology with no abscess.  There was no acute acute osseous abnormality   Assessment & Plan:   Principal Problem:   Cellulitis of right lower extremity Active Problems:   PAD (peripheral artery disease) (HCC)   Hypothyroidism   Tobacco abuse   Dyslipidemia   Chronic obstructive pulmonary disease (COPD) (HCC)   Cellulitis of right lower extremity - We will continue antibiotic therapy with IV Rocephin. - Warm compresses will be utilized. - Pain management will be provided, ADDED IV MORPHINE AND BENADRYL - Reviewed venous Doppler to rule out DVT-negative -- Pending PT eval 2/2 inability to ambulate with hip pain and LE edema --Xray and CT without acute osseous abnormality   PAD (peripheral artery disease) (HCC) - We will continue aspirin and Plavix.   Hypothyroidism - We will continue Synthroid.   Chronic  obstructive pulmonary disease (COPD) (HCC) - We will continue her inhalers. -- Pt to f/up with referral she recived for pulmonology 2/2 CT chest with concerning findings from OSH   Dyslipidemia - We will continue statin therapy  DVT prophylaxis: Lovenox SQ  Code Status: DNR/DNI    Code Status Orders  (From admission, onward)           Start     Ordered   02/13/22 0212  Do not attempt resuscitation (DNR)  Continuous       Question Answer Comment  In the event of cardiac or respiratory ARREST Do not call a "code blue"   In the event of cardiac or respiratory ARREST Do not perform Intubation, CPR, defibrillation or ACLS   In the event of cardiac or respiratory ARREST Use medication by any route, position, wound care, and other measures to relive pain and suffering. May use oxygen, suction and manual treatment of airway obstruction as needed for comfort.      02/13/22 0214           Code Status History     Date Active Date Inactive Code Status Order ID Comments User Context   01/04/2022 2037 01/10/2022 2302 DNR [id number]  Toy Baker, MD ED   11/30/2021 1326 11/30/2021 2204 Full Code [id number]  Serafina Mitchell, MD Inpatient   10/04/2019 1146 10/04/2019 1826 Full Code [id number]  Angelia Mould, MD Inpatient      Family Communication: discussed with son, requested updates daily Disposition Plan:   Pt not yet ready for discharge, requiring  iv abx and iv pain meds Consults called: None Admission status: Inpatient   Consultants:  None  Procedures:  VAS [place] LOWER EXTREMITY VENOUS (DVT)  Result Date: 02/14/2022  Lower Venous DVT Study Patient Name:  SNOW PEOPLES  Date of Exam:   02/14/2022 Medical Rec #: [id number]       Accession #:    [id number] Date of Birth: 1936-02-17       Patient Gender: F Patient Age:   78 years Exam Location:  [facility] Procedure:      VAS [place] LOWER EXTREMITY VENOUS (DVT) Referring Phys: Harrell Gave Viet Kemmerer  --------------------------------------------------------------------------------  Indications: Pain, Edema, and Erythema (cellulitis).  Limitations: Poor ultrasound/tissue interface. Comparison Study: No previous exams Performing Technologist: Jody Hill RVT, RDMS  Examination Guidelines: A complete evaluation includes B-mode imaging, spectral Doppler, color Doppler, and power Doppler as needed of all accessible portions of each vessel. Bilateral testing is considered an integral part of a complete examination. Limited examinations for reoccurring indications may be performed as noted. The reflux portion of the exam is performed with the patient in reverse Trendelenburg.  +---------+---------------+---------+-----------+--------------+--------------+ RIGHT    CompressibilityPhasicitySpontaneityProperties    Thrombus Aging +---------+---------------+---------+-----------+--------------+--------------+ CFV      Full           No       Yes        pulsatile flow               +---------+---------------+---------+-----------+--------------+--------------+ SFJ      Full                                                            +---------+---------------+---------+-----------+--------------+--------------+ FV Prox  Full           Yes      Yes                                     +---------+---------------+---------+-----------+--------------+--------------+ FV Mid   Full           Yes      Yes                                     +---------+---------------+---------+-----------+--------------+--------------+ FV DistalFull           Yes      Yes                                     +---------+---------------+---------+-----------+--------------+--------------+ PFV      Full                                                            +---------+---------------+---------+-----------+--------------+--------------+ POP      Full           No       Yes        pulsatile flow                +---------+---------------+---------+-----------+--------------+--------------+  PTV      Full                                                            +---------+---------------+---------+-----------+--------------+--------------+ PERO     Full                                                            +---------+---------------+---------+-----------+--------------+--------------+   +----+---------------+---------+-----------+--------------+--------------+ LEFTCompressibilityPhasicitySpontaneityProperties    Thrombus Aging +----+---------------+---------+-----------+--------------+--------------+ CFV Full           No       Yes        pulsatile flow               +----+---------------+---------+-----------+--------------+--------------+    Summary: RIGHT: - There is no evidence of deep vein thrombosis in the lower extremity.  - No cystic structure found in the popliteal fossa. - Subcutaneous edema extending from popliteal fossa to ankle. - Ultrasound characteristics of enlarged lymph nodes are noted in the groin.  LEFT: - No evidence of common femoral vein obstruction.  *See table(s) above for measurements and observations.    Preliminary    CT TIBIA FIBULA RIGHT W CONTRAST  Result Date: 02/13/2022 CLINICAL DATA:  Soft tissue infection suspected, lower leg. EXAM: CT OF THE LOWER RIGHT EXTREMITY WITH CONTRAST TECHNIQUE: Multidetector CT imaging of the lower right extremity was performed according to the standard protocol following intravenous contrast administration. RADIATION DOSE REDUCTION: This exam was performed according to the departmental dose-optimization program which includes automated exposure control, adjustment of the mA and/or kV according to patient size and/or use of iterative reconstruction technique. CONTRAST:  21m OMNIPAQUE IOHEXOL 300 MG/ML  SOLN COMPARISON:  02/12/2022. FINDINGS: Bones/Joint/Cartilage No acute fracture or dislocation. Mild-to-moderate  degenerative changes are noted at the knee. Minimal calcaneal spurring is present. No periosteal elevation or bony erosion. Evaluation of the digits is limited due to osteopenia. Ligaments Suboptimally assessed by CT. Muscles and Tendons No definite intramuscular edema or abnormal enhancement. Soft tissues Diffuse subcutaneous edema is noted. The vasculature appears patent. No abscess or focal fluid collection. No abnormal enhancement. Multiple varicose veins are noted. IMPRESSION: 1. Diffuse lower extremity edema which may be infectious or inflammatory. No abscess or abnormal enhancement. 2. No acute osseous abnormality. Electronically Signed   By: LBrett FairyM.D.   On: 02/13/2022 00:34   DG Tibia/Fibula Right  Result Date: 02/12/2022 CLINICAL DATA:  Wound infection EXAM: RIGHT TIBIA AND FIBULA - 2 VIEW COMPARISON:  01/04/2022 ankle radiograph FINDINGS: No fracture or malalignment. Considerable soft tissue edema. Small ulcers or wounds at the medial and lateral aspect of the ankle. No osseous destructive change. IMPRESSION: No acute osseous abnormality. Considerable soft tissue edema with ulcers or wounds at the medial and lateral aspect of the ankle Electronically Signed   By: KDonavan FoilM.D.   On: 02/12/2022 22:19   VAS [place] WITH/WO TBI  Result Date: 02/09/2022  LOWER EXTREMITY DOPPLER STUDY Patient Name:  KSU Johnson Date of Exam:   02/09/2022 Medical Rec #: [id number]      Accession #:  [id number] Date of Birth: 1936-04-25       Patient Gender: F Patient Age:   47 years Exam Location:  [facility] Procedure:      VAS [place] ABI WITH/WO TBI Referring Phys: SAMANTHA RHYNE --------------------------------------------------------------------------------  Indications: Ulceration, and peripheral artery disease. High Risk Factors: Current smoker.  Vascular Interventions: 11/30/2021: Right SFA stent. Comparison Study: 12/20/2021: Rt ABI 0.98; Lt ABI 0.68 Performing Technologist: Ivan Croft  Examination Guidelines: A complete evaluation includes at minimum, Doppler waveform signals and systolic blood pressure reading at the level of bilateral brachial, anterior tibial, and posterior tibial arteries, when vessel segments are accessible. Bilateral testing is considered an integral part of a complete examination. Photoelectric Plethysmograph (PPG) waveforms and toe systolic pressure readings are included as required and additional duplex testing as needed. Limited examinations for reoccurring indications may be performed as noted.  ABI Findings: +---------+------------------+-----+--------+--------+ Right    Rt Pressure (mmHg)IndexWaveformComment  +---------+------------------+-----+--------+--------+ Brachial 151                                     +---------+------------------+-----+--------+--------+ PTA      149               0.99 biphasic         +---------+------------------+-----+--------+--------+ DP       150               0.99 biphasic         +---------+------------------+-----+--------+--------+ Great Toe141               0.93                  +---------+------------------+-----+--------+--------+ +---------+------------------+-----+----------+-------+ Left     Lt Pressure (mmHg)IndexWaveform  Comment +---------+------------------+-----+----------+-------+ Brachial 146                                      +---------+------------------+-----+----------+-------+ PTA      93                0.62 monophasic        +---------+------------------+-----+----------+-------+ DP       92                0.61 monophasic        +---------+------------------+-----+----------+-------+ Great Toe51                0.34                   +---------+------------------+-----+----------+-------+ +-------+-----------+-----------+------------+------------+ ABI/TBIToday's ABIToday's TBIPrevious ABIPrevious TBI  +-------+-----------+-----------+------------+------------+ Right  0.99       0.93       0.98        0.68         +-------+-----------+-----------+------------+------------+ Left   0.62       0.34       0.68        0.40         +-------+-----------+-----------+------------+------------+   Summary: Right: Resting right ankle-brachial index is within normal range. No evidence of significant right lower extremity arterial disease. The right toe-brachial index is normal. Left: Resting left ankle-brachial index indicates moderate left lower extremity arterial disease. The left toe-brachial index is abnormal. *See table(s) above for measurements and observations.  Electronically signed by Servando Snare MD on 02/09/2022 at 1:21:26 PM.  Final     Antimicrobials:  CTX    Subjective: Reports pain is somewhat improved although still significant Reports inability to stand on leg because of edema and pain Requested PT consult, hip x-ray and CT of lower extremity without osseous abnormality  Objective: Vitals:   02/14/22 0154 02/14/22 0201 02/14/22 0517 02/14/22 0837  BP: (!) 128/59 (!) 125/57 (!) 159/65   Pulse: 90 88 94   Resp: '16 18 18   '$ Temp: 97.8 F (36.6 C) 99.3 F (37.4 C) 98.8 F (37.1 C)   TempSrc: Oral Oral Oral   SpO2: 93% 97% 99% 94%  Weight:      Height:        Intake/Output Summary (Last 24 hours) at 02/14/2022 1248 Last data filed at 02/14/2022 1000 Gross per 24 hour  Intake 2902.37 ml  Output 350 ml  Net 2552.37 ml   Filed Weights   02/13/22 0357  Weight: 53 kg    Examination:  General exam: Appears calm and comfortable  Respiratory system: Clear to auscultation. Respiratory effort normal. Cardiovascular system: S1 & S2 heard, RRR. No JVD, murmurs, rubs, gallops or clicks. No pedal edema. Gastrointestinal system: Abdomen is nondistended, soft and nontender. No organomegaly or masses felt. Normal bowel sounds heard. Central nervous system: Alert and oriented. No  focal neurological deficits. Extremities: Right lower extremity with significant erythema remains within margins slightly decreased in intensity Skin: Cellulitic findings as above Psychiatry: Judgement and insight appear normal. Mood & affect appropriate.     Data Reviewed: I have personally reviewed following labs and imaging studies  CBC: Recent Labs  Lab 02/12/22 2156 02/13/22 0300 02/14/22 0443  WBC 16.9* 15.1* 13.6*  NEUTROABS 14.8*  --  10.8*  HGB 9.9* 8.6* 8.8*  HCT 31.8* 28.0* 29.1*  MCV 98.5 98.2 98.3  PLT 247 230 782   Basic Metabolic Panel: Recent Labs  Lab 02/12/22 2156 02/13/22 0300 02/14/22 0443  NA 139 140 138  K 3.5 3.7 3.9  CL 106 109 106  CO2 '24 22 23  '$ GLUCOSE 105* 120* 89  BUN 29* 28* 22  CREATININE 0.80 0.76 0.70  CALCIUM 8.6* 8.2* 7.8*   GFR: Estimated Creatinine Clearance: 44.6 mL/min (by C-G formula based on SCr of 0.7 mg/dL). Liver Function Tests: Recent Labs  Lab 02/12/22 2156  AST 27  ALT 25  ALKPHOS 105  BILITOT 0.8  PROT 6.9  ALBUMIN 3.0*   No results for input(s): "LIPASE", "AMYLASE" in the last 168 hours. No results for input(s): "AMMONIA" in the last 168 hours. Coagulation Profile: Recent Labs  Lab 02/12/22 2156  INR 1.0   Cardiac Enzymes: No results for input(s): "CKTOTAL", "CKMB", "CKMBINDEX", "TROPONINI" in the last 168 hours. BNP (last 3 results) No results for input(s): "PROBNP" in the last 8760 hours. HbA1C: No results for input(s): "HGBA1C" in the last 72 hours. CBG: No results for input(s): "GLUCAP" in the last 168 hours. Lipid Profile: No results for input(s): "CHOL", "HDL", "LDLCALC", "TRIG", "CHOLHDL", "LDLDIRECT" in the last 72 hours. Thyroid Function Tests: No results for input(s): "TSH", "T4TOTAL", "FREET4", "T3FREE", "THYROIDAB" in the last 72 hours. Anemia Panel: No results for input(s): "VITAMINB12", "FOLATE", "FERRITIN", "TIBC", "IRON", "RETICCTPCT" in the last 72 hours. Sepsis Labs: Recent Labs   Lab 02/12/22 0300 02/12/22 2156  LATICACIDVEN 1.7 1.4    Recent Results (from the past 240 hour(s))  Blood culture (routine x 2)     Status: None (Preliminary result)   Collection Time: 02/12/22  9:57 PM  Specimen: BLOOD  Result Value Ref Range Status   Specimen Description   Final    BLOOD LEFT ANTECUBITAL Performed at [facility] [address] [place], [place] [address]    Special Requests   Final    BOTTLES DRAWN AEROBIC AND ANAEROBIC Blood Culture results may not be optimal due to an inadequate volume of blood received in culture bottles Performed at [facility] [address], [place], [place] [address]    Culture   Final    NO GROWTH 1 DAY Performed at [facility], [place] [address], [place], [place] [address]    Report Status PENDING  Incomplete  Blood culture (routine x 2)     Status: None (Preliminary result)   Collection Time: 02/12/22 10:54 PM   Specimen: BLOOD  Result Value Ref Range Status   Specimen Description   Final    BLOOD BLOOD LEFT FOREARM Performed at [facility] [address], [place], [place] [address]    Special Requests   Final    BOTTLES DRAWN AEROBIC AND ANAEROBIC Blood Culture results may not be optimal due to an excessive volume of blood received in culture bottles Performed at [facility] [address] [place], [place] [address]    Culture   Final    NO GROWTH 1 DAY Performed at [facility], [place] [address], [place], [place] [address]    Report Status PENDING  Incomplete         Radiology Studies: VAS [place] LOWER EXTREMITY VENOUS (DVT)  Result Date: 02/14/2022  Lower Venous DVT Study Patient Name:  Tamara Johnson  Date of Exam:   02/14/2022 Medical Rec #: [id number]       Accession #:    [id number] Date of Birth: 08-15-1935       Patient Gender: F Patient Age:   73 years Exam Location:  [facility] Procedure:      VAS [place] LOWER  EXTREMITY VENOUS (DVT) Referring Phys: Harrell Gave Elaine Middleton --------------------------------------------------------------------------------  Indications: Pain, Edema, and Erythema (cellulitis).  Limitations: Poor ultrasound/tissue interface. Comparison Study: No previous exams Performing Technologist: Jody Hill RVT, RDMS  Examination Guidelines: A complete evaluation includes B-mode imaging, spectral Doppler, color Doppler, and power Doppler as needed of all accessible portions of each vessel. Bilateral testing is considered an integral part of a complete examination. Limited examinations for reoccurring indications may be performed as noted. The reflux portion of the exam is performed with the patient in reverse Trendelenburg.  +---------+---------------+---------+-----------+--------------+--------------+ RIGHT    CompressibilityPhasicitySpontaneityProperties    Thrombus Aging +---------+---------------+---------+-----------+--------------+--------------+ CFV      Full           No       Yes        pulsatile flow               +---------+---------------+---------+-----------+--------------+--------------+ SFJ      Full                                                            +---------+---------------+---------+-----------+--------------+--------------+ FV Prox  Full           Yes      Yes                                     +---------+---------------+---------+-----------+--------------+--------------+  FV Mid   Full           Yes      Yes                                     +---------+---------------+---------+-----------+--------------+--------------+ FV DistalFull           Yes      Yes                                     +---------+---------------+---------+-----------+--------------+--------------+ PFV      Full                                                            +---------+---------------+---------+-----------+--------------+--------------+ POP       Full           No       Yes        pulsatile flow               +---------+---------------+---------+-----------+--------------+--------------+ PTV      Full                                                            +---------+---------------+---------+-----------+--------------+--------------+ PERO     Full                                                            +---------+---------------+---------+-----------+--------------+--------------+   +----+---------------+---------+-----------+--------------+--------------+ LEFTCompressibilityPhasicitySpontaneityProperties    Thrombus Aging +----+---------------+---------+-----------+--------------+--------------+ CFV Full           No       Yes        pulsatile flow               +----+---------------+---------+-----------+--------------+--------------+    Summary: RIGHT: - There is no evidence of deep vein thrombosis in the lower extremity.  - No cystic structure found in the popliteal fossa. - Subcutaneous edema extending from popliteal fossa to ankle. - Ultrasound characteristics of enlarged lymph nodes are noted in the groin.  LEFT: - No evidence of common femoral vein obstruction.  *See table(s) above for measurements and observations.    Preliminary    CT TIBIA FIBULA RIGHT W CONTRAST  Result Date: 02/13/2022 CLINICAL DATA:  Soft tissue infection suspected, lower leg. EXAM: CT OF THE LOWER RIGHT EXTREMITY WITH CONTRAST TECHNIQUE: Multidetector CT imaging of the lower right extremity was performed according to the standard protocol following intravenous contrast administration. RADIATION DOSE REDUCTION: This exam was performed according to the departmental dose-optimization program which includes automated exposure control, adjustment of the mA and/or kV according to patient size and/or use of iterative reconstruction technique. CONTRAST:  24m OMNIPAQUE IOHEXOL 300 MG/ML  SOLN COMPARISON:  02/12/2022. FINDINGS:  Bones/Joint/Cartilage No acute fracture or dislocation. Mild-to-moderate degenerative changes are noted  at the knee. Minimal calcaneal spurring is present. No periosteal elevation or bony erosion. Evaluation of the digits is limited due to osteopenia. Ligaments Suboptimally assessed by CT. Muscles and Tendons No definite intramuscular edema or abnormal enhancement. Soft tissues Diffuse subcutaneous edema is noted. The vasculature appears patent. No abscess or focal fluid collection. No abnormal enhancement. Multiple varicose veins are noted. IMPRESSION: 1. Diffuse lower extremity edema which may be infectious or inflammatory. No abscess or abnormal enhancement. 2. No acute osseous abnormality. Electronically Signed   By: Brett Fairy M.D.   On: 02/13/2022 00:34   DG Tibia/Fibula Right  Result Date: 02/12/2022 CLINICAL DATA:  Wound infection EXAM: RIGHT TIBIA AND FIBULA - 2 VIEW COMPARISON:  01/04/2022 ankle radiograph FINDINGS: No fracture or malalignment. Considerable soft tissue edema. Small ulcers or wounds at the medial and lateral aspect of the ankle. No osseous destructive change. IMPRESSION: No acute osseous abnormality. Considerable soft tissue edema with ulcers or wounds at the medial and lateral aspect of the ankle Electronically Signed   By: Donavan Foil M.D.   On: 02/12/2022 22:19        Scheduled Meds:  aspirin EC  81 mg Oral Daily   atorvastatin  10 mg Oral Daily   buPROPion ER  300 mg Oral Daily   clopidogrel  75 mg Oral Daily   enoxaparin (LOVENOX) injection  40 mg Subcutaneous Q24H   ferrous sulfate  325 mg Oral Q breakfast   levothyroxine  25 mcg Oral q morning   loratadine  10 mg Oral Daily   mometasone-formoterol  2 puff Inhalation BID   And   umeclidinium bromide  1 puff Inhalation Daily   rOPINIRole  0.5 mg Oral QHS   vitamin B-12  500 mcg Oral Daily   [START ON 02/20/2022] Vitamin D (Ergocalciferol)  50,000 Units Oral Weekly   Continuous Infusions:  sodium chloride  125 mL/hr at 02/14/22 0913   cefTRIAXone (ROCEPHIN)  IV 2 g (02/14/22 0007)     LOS: 1 day    Time spent: 40 MIN    Nicolette Bang, MD [facility]  If 7PM-7AM, please contact night-coverage  02/14/2022, 12:48 PM

## 2022-02-14 NOTE — Progress Notes (Signed)
RLE venous duplex has been completed.   Results can be found under chart review under CV PROC. 02/14/2022 10:55 AM Shala Baumbach RVT, RDMS

## 2022-02-15 DIAGNOSIS — L03115 Cellulitis of right lower limb: Secondary | ICD-10-CM | POA: Diagnosis not present

## 2022-02-15 LAB — CBC WITH DIFFERENTIAL/PLATELET
Abs Immature Granulocytes: 0.05 10*3/uL (ref 0.00–0.07)
Basophils Absolute: 0 10*3/uL (ref 0.0–0.1)
Basophils Relative: 0 %
Eosinophils Absolute: 0 10*3/uL (ref 0.0–0.5)
Eosinophils Relative: 0 %
HCT: 26.6 % — ABNORMAL LOW (ref 36.0–46.0)
Hemoglobin: 8.1 g/dL — ABNORMAL LOW (ref 12.0–15.0)
Immature Granulocytes: 1 %
Lymphocytes Relative: 10 %
Lymphs Abs: 0.9 10*3/uL (ref 0.7–4.0)
MCH: 29.8 pg (ref 26.0–34.0)
MCHC: 30.5 g/dL (ref 30.0–36.0)
MCV: 97.8 fL (ref 80.0–100.0)
Monocytes Absolute: 1.3 10*3/uL — ABNORMAL HIGH (ref 0.1–1.0)
Monocytes Relative: 14 %
Neutro Abs: 6.9 10*3/uL (ref 1.7–7.7)
Neutrophils Relative %: 75 %
Platelets: 242 10*3/uL (ref 150–400)
RBC: 2.72 MIL/uL — ABNORMAL LOW (ref 3.87–5.11)
RDW: 16.1 % — ABNORMAL HIGH (ref 11.5–15.5)
WBC: 9.2 10*3/uL (ref 4.0–10.5)
nRBC: 0 % (ref 0.0–0.2)

## 2022-02-15 LAB — BASIC METABOLIC PANEL
Anion gap: 7 (ref 5–15)
BUN: 16 mg/dL (ref 8–23)
CO2: 25 mmol/L (ref 22–32)
Calcium: 7.6 mg/dL — ABNORMAL LOW (ref 8.9–10.3)
Chloride: 108 mmol/L (ref 98–111)
Creatinine, Ser: 0.68 mg/dL (ref 0.44–1.00)
GFR, Estimated: >60 mL/min (ref >60)
Glucose, Bld: 101 mg/dL — ABNORMAL HIGH (ref 70–99)
Potassium: 3.8 mmol/L (ref 3.5–5.1)
Sodium: 140 mmol/L (ref 135–145)

## 2022-02-15 NOTE — NC FL2 (Signed)
[place] LEVEL OF CARE SCREENING TOOL     IDENTIFICATION  Patient Name: Tamara Johnson Birthdate: 08/26/1935 Sex: female Admission Date (Current Location): 02/12/2022  [facility] and [place] Number:  Herbalist and Address:  [facility],  [place], [place]      Provider Number: [id number]  Attending Physician Name and Address:  [place] Mikalia Fessel J, DO  Relative Name and Phone Number:  ELLAREE, GEAR   [phone]  Armond Hang   [phone]    Current Level of Care: SNF Recommended Level of Care: [place] Prior Approval Number:    Date Approved/Denied:   PASRR Number: [id number]  Discharge Plan: SNF    Current Diagnoses: Patient Active Problem List   Diagnosis Date Noted   Cellulitis of right lower extremity 02/13/2022   Dyslipidemia 02/13/2022   Chronic obstructive pulmonary disease (COPD) ([place]) 02/13/2022   Protein-calorie malnutrition, moderate ([place]) 01/07/2022   Hoarseness of voice 01/06/2022   Cellulitis 01/05/2022   Cellulitis of right leg 01/04/2022   PAD (peripheral artery disease) ([place]) 01/04/2022   Leg ulcer, right, limited to breakdown of skin ([place]) 01/04/2022   Anemia 01/04/2022   AKI (acute kidney injury) ([place]) 01/04/2022   Hypothyroidism 01/04/2022   Tobacco abuse 01/04/2022    Orientation RESPIRATION BLADDER Height & Weight     Self, Time, Situation, Place  Normal Continent Weight: 116 lb 13.5 oz (53 kg) Height:  '5\' 6"'$  (167.6 cm)  BEHAVIORAL SYMPTOMS/MOOD NEUROLOGICAL BOWEL NUTRITION STATUS      Continent Diet (Cardiac)  AMBULATORY STATUS COMMUNICATION OF NEEDS Skin   Limited Assist Verbally Normal                       Personal Care Assistance Level of Assistance  Bathing, Feeding, Dressing Bathing Assistance: Limited assistance Feeding assistance: Independent Dressing Assistance: Limited assistance     Functional Limitations Info  Sight, Hearing, Speech  Sight Info: Adequate Hearing Info: Adequate Speech Info: Adequate    SPECIAL CARE FACTORS FREQUENCY  PT (By licensed PT), OT (By licensed OT)     PT Frequency: Minimum 5x a week OT Frequency: Minimum 5x a week            Contractures      Additional Factors Info  Psychotropic, Code Status, Allergies Code Status Info: DNR Allergies Info: No Known Allergies Psychotropic Info: buPROPion (WELLBUTRIN SR) 12 hr tablet 300 mg         Current Medications (02/15/2022):  This is the current hospital active medication list Current Facility-Administered Medications  Medication Dose Route Frequency Provider Last Rate Last Admin   acetaminophen (TYLENOL) tablet 650 mg  650 mg Oral Q6H PRN Mansy, Jan A, MD       Or   acetaminophen (TYLENOL) suppository 650 mg  650 mg Rectal Q6H PRN Mansy, Jan A, MD       albuterol (PROVENTIL) (2.5 MG/3ML) 0.083% nebulizer solution 2.5 mg  2.5 mg Nebulization Q4H PRN Mansy, Jan A, MD       aspirin EC tablet 81 mg  81 mg Oral Daily Mansy, Jan A, MD   81 mg at 02/15/22 0842   atorvastatin (LIPITOR) tablet 10 mg  10 mg Oral Daily Mansy, Jan A, MD   10 mg at 02/15/22 0842   buPROPion (WELLBUTRIN SR) 12 hr tablet 300 mg  300 mg Oral Daily Dimple Nanas, RPH   300 mg at 02/15/22 0842   cefTRIAXone (ROCEPHIN)  2 g in sodium chloride 0.9 % 100 mL IVPB  2 g Intravenous Q24H Mansy, Jan A, MD 200 mL/hr at 02/14/22 2359 2 g at 02/14/22 2359   clopidogrel (PLAVIX) tablet 75 mg  75 mg Oral Daily Mansy, Jan A, MD   75 mg at 02/15/22 0842   diphenhydrAMINE (BENADRYL) injection 12.5 mg  12.5 mg Intravenous Q6H PRN Marcell Anger, MD       enoxaparin (LOVENOX) injection 40 mg  40 mg Subcutaneous Q24H Mansy, Jan A, MD   40 mg at 02/15/22 8366   ferrous sulfate tablet 325 mg  325 mg Oral Q breakfast Mansy, Jan A, MD   325 mg at 02/15/22 0845   ipratropium-albuterol (DUONEB) 0.5-2.5 (3) MG/3ML nebulizer solution 3 mL  3 mL Nebulization Q6H PRN Mansy, Jan A, MD        levothyroxine (SYNTHROID) tablet 25 mcg  25 mcg Oral q morning Mansy, Jan A, MD   25 mcg at 02/15/22 0535   loratadine (CLARITIN) tablet 10 mg  10 mg Oral Daily Mansy, Jan A, MD   10 mg at 02/15/22 0842   magnesium hydroxide (MILK OF MAGNESIA) suspension 30 mL  30 mL Oral Daily PRN Mansy, Jan A, MD       mometasone-formoterol (DULERA) 200-5 MCG/ACT inhaler 2 puff  2 puff Inhalation BID Tawnya Crook, RPH   2 puff at 02/15/22 0750   And   umeclidinium bromide (INCRUSE ELLIPTA) 62.5 MCG/ACT 1 puff  1 puff Inhalation Daily Tawnya Crook, RPH   1 puff at 02/15/22 0750   morphine (PF) 2 MG/ML injection 2 mg  2 mg Intravenous Q3H PRN Marcell Anger, MD   2 mg at 02/15/22 1439   ondansetron (ZOFRAN) tablet 4 mg  4 mg Oral Q6H PRN Mansy, Jan A, MD       Or   ondansetron [facility]) injection 4 mg  4 mg Intravenous Q6H PRN Mansy, Jan A, MD       oxyCODONE-acetaminophen (PERCOCET/ROXICET) 5-325 MG per tablet 1 tablet  1 tablet Oral Q4H PRN Mansy, Jan A, MD   1 tablet at 02/15/22 0534   rOPINIRole (REQUIP) tablet 0.5 mg  0.5 mg Oral QHS Mansy, Jan A, MD   0.5 mg at 02/14/22 2114   traZODone (DESYREL) tablet 25 mg  25 mg Oral QHS PRN Mansy, Jan A, MD   25 mg at 02/13/22 2159   vitamin B-12 (CYANOCOBALAMIN) tablet 500 mcg  500 mcg Oral Daily Mansy, Jan A, MD   500 mcg at 02/15/22 0842   [START ON 02/20/2022] Vitamin D (Ergocalciferol) (DRISDOL) capsule 50,000 Units  50,000 Units Oral Weekly Mansy, Arvella Merles, MD         Discharge Medications: Please see discharge summary for a list of discharge medications.  Relevant Imaging Results:  Relevant Lab Results:   Additional Information SSN [id number]  Ross Ludwig, LCSW

## 2022-02-15 NOTE — TOC Initial Note (Addendum)
Transition of Care [facility]) - Initial/Assessment Note    Patient Details  Name: Tamara Johnson MRN: [id number] Date of Birth: 12-17-1935  Transition of [facility] CM/SW Contact:    Ross Ludwig, LCSW Phone Number: 02/15/2022, 6:31 PM  Clinical Narrative:                  Patient is an 86 year old female who is alert and oriented x4.  Patient has been to rehab in the past, patient and son tried to get her in rehab during the last hospitalization, but patient did not meet SNF criteria per Medicare guidelines.  PT worked with patient, and are recommending SNF before she returns back home.  CSW spoke to patient's son and they would prefer [facility] for short term rehab.  Patient has been giving permission to begin bed search in [facility].  Patient's son was explained process of finding SNF placement, and how insurance will cover patient's stay.  CSW to continue to follow patient's progress throughout discharge planning.  Barriers to Discharge: Continued Medical Work up  Patient Goals and CMS Choice Patient states their goals for this hospitalization and ongoing recovery are:: To go to SNF for short term rehab, then return back home. CMS Medicare.gov Compare Post Acute Care list provided to:: Patient Represenative (must comment) Choice offered to / list presented to : Adult Children  Expected Discharge Plan and Services Expected Discharge Plan: [place] In-house Referral: Clinical Social Work   Post Acute Care Choice: [place] Living arrangements for the past 2 months: [place]                                      Prior Living Arrangements/Services Living arrangements for the past 2 months: Single Family Home Lives with:: Adult Children Patient language and need for interpreter reviewed:: Yes Do you feel safe going back to the place where you live?: No   Patient and family feel she needs rehab befor returning back home.   Need for Family Participation in Patient Care: Yes (Comment) Care giver support system in place?: No (comment) Current home services: Home PT, Home RN Criminal Activity/Legal Involvement Pertinent to Current Situation/Hospitalization: No - Comment as needed  Activities of Daily Living Home Assistive Devices/Equipment: Walker (specify type) ADL Screening (condition at time of admission) Patient's cognitive ability adequate to safely complete daily activities?: Yes Is the patient deaf or have difficulty hearing?: No Does the patient have difficulty seeing, even when wearing glasses/contacts?: No Does the patient have difficulty concentrating, remembering, or making decisions?: No Patient able to express need for assistance with ADLs?: Yes Does the patient have difficulty dressing or bathing?: Yes Independently performs ADLs?: No Communication: Independent Dressing (OT): Needs assistance Grooming: Needs assistance Feeding: Independent Bathing: Needs assistance Toileting: Independent Does the patient have difficulty walking or climbing stairs?: Yes Weakness of Legs: Right Weakness of Arms/Hands: Left  Permission Sought/Granted Permission sought to share information with : Case Manager, Customer service manager, Family Supports Permission granted to share information with : Yes, Verbal Permission Granted  Share Information with NAME: ASJAH, RAUDA   [id number]  Armond Hang   [phone]  Permission granted to share info w AGENCY: SNF admissions        Emotional Assessment Appearance:: Appears stated age Attitude/Demeanor/Rapport: Engaged Affect (typically observed): Calm, Stable, Appropriate, Accepting Orientation: : Oriented to Self, Oriented to Place,  Oriented to  Time, Oriented to Situation Alcohol / Substance Use: Not Applicable Psych Involvement: No (comment)  Admission diagnosis:  Cellulitis of right lower extremity [L03.115] Patient Active Problem  List   Diagnosis Date Noted   Cellulitis of right lower extremity 02/13/2022   Dyslipidemia 02/13/2022   Chronic obstructive pulmonary disease (COPD) ([place]) 02/13/2022   Protein-calorie malnutrition, moderate (HCC) 01/07/2022   Hoarseness of voice 01/06/2022   Cellulitis 01/05/2022   Cellulitis of right leg 01/04/2022   PAD (peripheral artery disease) ([place]) 01/04/2022   Leg ulcer, right, limited to breakdown of skin ([place]) 01/04/2022   Anemia 01/04/2022   AKI (acute kidney injury) ([place]) 01/04/2022   Hypothyroidism 01/04/2022   Tobacco abuse 01/04/2022   PCP:  Bonnita Nasuti, MD Pharmacy:   [facility] [place], [place] - [address] AT [place] [place] [address] Phone: [phone] Fax: [phone]     Social Determinants of Health (SDOH) Interventions    Readmission Risk Interventions     No data to display

## 2022-02-15 NOTE — Progress Notes (Addendum)
PROGRESS NOTE    Tamara Johnson  JJO:[id number] DOB: May 04, 1936 DOA: 02/12/2022 PCP: Bonnita Nasuti, MD    Brief Narrative:   Tamara Johnson is a 86 y.o. female with past medical history significant for PAD/PVD s/p stents right lower extremity, HLD, COPD on 2 L nasal cannula at baseline, hypothyroidism, RLS who presented to [facility] ED on 7/8 with progressive edema and redness of her right lower extremity extending from her foot to knee.  In the ED, temperature 98.9 F, HR 85, RR 17, BP 134/68, SPO2 97% on 2 L nasal cannula which is her baseline.  Sodium 139, potassium 3.5, chloride 106, CO2 24, glucose 105, BUN 29, creatinine 0.80.  AST 27, ALT 25, total bilirubin 0.8.  Lactic acid 1.7.  WBC is 16.9, hemoglobin 9.9, platelets 247.  INR 1.0.  Urinalysis unrevealing.  X-ray right tibia/fibula with no acute osseous abnormality, considerable soft tissue edema with ulcer/wounds medial and lateral aspect of the ankle.  CT tibia fibula with diffuse lower extremity edema which may be infectious, no abscess or abnormal enhancement, no acute osseous abnormality.  Blood cultures x2 obtained.  Patient was started on IV antibiotics.  TRH consulted for admission for cellulitis.  Assessment & Plan:   Right lower extremity cellulitis Patient presenting to ED with progressive swelling and erythema to right lower extremity extending from foot to knee.  Recently underwent stent placement right lower extremity due to her underlying peripheral vascular disease.  Patient with notable elevated WBC count of 16.9 with imaging notable for diffuse lower extremity edema without abscess and no acute osseous abnormality.  Venous duplex ultrasound right lower extremity negative for DVT. --WBC 16.9>>9.2 --Ceftriaxone 2 g IV every 24 hours --Oxycodone/morphine as needed for pain control --CBC daily  Peripheral artery disease Follows with vascular surgery outpatient.  Recent stent placed to right lower extremity. --Continue  aspirin 81 mg p.o. daily and Plavix 75 mg p.o. daily  Hypothyroidism --Levothyroxine  COPD Chronic hypoxic respiratory failure --Albuterol neb as needed --DuoNebs every 6 hours as needed shortness of breath/wheezing --Dulera/Incruse Ellipta --Continue 2 L supplemental oxygen, which is her baseline with goal SPO2 greater than 88%  Hyperlipidemia --Atorvastatin 10 mg p.o. daily  Anxiety/depression: --Wellbutrin 3 mg p.o. daily  Restless leg syndrome: Ropinirole 0.5 mg p.o. nightly  B12 deficiency: Continue vitamin B12 500 mcg p.o. daily  Vitamin D deficiency: Ergocalciferol 50,000 units p.o. weekly.  Weakness/debility/gait disturbance/deconditioning --PT/OT evaluation: Pending   DVT prophylaxis: enoxaparin (LOVENOX) injection 40 mg Start: 02/13/22 1000    Code Status: DNR Family Communication: No family present at bedside  Disposition Plan:  Level of care: Med-Surg Status is: Inpatient Remains inpatient appropriate because: Continues on IV antibiotics, awaiting PT/OT evaluation, may require SNF placement as currently lives alone with now difficulty ambulating    Consultants:  None  Procedures:  Right lower extremity vascular duplex ultrasound  Antimicrobials:  Ceftriaxone 7/8>>   Subjective: Patient seen examined bedside, resting comfortably.  Lying in bed.  Continues with swelling to right lower extremity but pain much improved.  Awaiting PT/OT evaluation.  States she lives alone and does not know if she will be able to return home.  No other specific questions or concerns at this time.  Denies headache, no dizziness, no chest pain, no palpitations, no shortness of breath, no fever/chills/night sweats, no nausea/vomiting/diarrhea, no abdominal pain, no focal weakness, no fatigue, no paresthesias.  No acute events overnight per nursing staff.  Objective: Vitals:   02/14/22 2037 02/14/22 2039  02/15/22 0750 02/15/22 0751  BP: (!) 120/56     Pulse: 81     Resp: 18      Temp: 98.6 F (37 C)     TempSrc: Oral     SpO2: 98% 96% 95% 98%  Weight:      Height:        Intake/Output Summary (Last 24 hours) at 02/15/2022 1212 Last data filed at 02/15/2022 1000 Gross per 24 hour  Intake 3973.91 ml  Output 2100 ml  Net 1873.91 ml   Filed Weights   02/13/22 0357  Weight: 53 kg    Examination:  Physical Exam: GEN: NAD, alert and oriented x 3, elderly in appearance HEENT: NCAT, PERRL, EOMI, sclera clear, MMM PULM: CTAB w/o wheezes/crackles, normal respiratory effort, on 2 L nasal cannula which is her baseline CV: RRR w/o M/G/R GI: abd soft, NTND, NABS, no R/G/M MSK: + RLE peripheral edema extending from the foot to knee with erythema but not extending above the demarcated lines NEURO: CN II-XII intact, no focal deficits, sensation to light touch intact PSYCH: normal mood/affect Integumentary: Right lower extremity with erythema extending from the foot to knee that has not past the demarcated lines at time of admission    Data Reviewed: I have personally reviewed following labs and imaging studies  CBC: Recent Labs  Lab 02/12/22 2156 02/13/22 0300 02/14/22 0443 02/15/22 0445  WBC 16.9* 15.1* 13.6* 9.2  NEUTROABS 14.8*  --  10.8* 6.9  HGB 9.9* 8.6* 8.8* 8.1*  HCT 31.8* 28.0* 29.1* 26.6*  MCV 98.5 98.2 98.3 97.8  PLT 247 230 246 259   Basic Metabolic Panel: Recent Labs  Lab 02/12/22 2156 02/13/22 0300 02/14/22 0443 02/15/22 0445  NA 139 140 138 140  K 3.5 3.7 3.9 3.8  CL 106 109 106 108  CO2 '24 22 23 25  '$ GLUCOSE 105* 120* 89 101*  BUN 29* 28* 22 16  CREATININE 0.80 0.76 0.70 0.68  CALCIUM 8.6* 8.2* 7.8* 7.6*   GFR: Estimated Creatinine Clearance: 44.6 mL/min (by C-G formula based on SCr of 0.68 mg/dL). Liver Function Tests: Recent Labs  Lab 02/12/22 2156  AST 27  ALT 25  ALKPHOS 105  BILITOT 0.8  PROT 6.9  ALBUMIN 3.0*   No results for input(s): "LIPASE", "AMYLASE" in the last 168 hours. No results for input(s):  "AMMONIA" in the last 168 hours. Coagulation Profile: Recent Labs  Lab 02/12/22 2156  INR 1.0   Cardiac Enzymes: No results for input(s): "CKTOTAL", "CKMB", "CKMBINDEX", "TROPONINI" in the last 168 hours. BNP (last 3 results) No results for input(s): "PROBNP" in the last 8760 hours. HbA1C: No results for input(s): "HGBA1C" in the last 72 hours. CBG: No results for input(s): "GLUCAP" in the last 168 hours. Lipid Profile: No results for input(s): "CHOL", "HDL", "LDLCALC", "TRIG", "CHOLHDL", "LDLDIRECT" in the last 72 hours. Thyroid Function Tests: No results for input(s): "TSH", "T4TOTAL", "FREET4", "T3FREE", "THYROIDAB" in the last 72 hours. Anemia Panel: No results for input(s): "VITAMINB12", "FOLATE", "FERRITIN", "TIBC", "IRON", "RETICCTPCT" in the last 72 hours. Sepsis Labs: Recent Labs  Lab 02/12/22 0300 02/12/22 2156  LATICACIDVEN 1.7 1.4    Recent Results (from the past 240 hour(s))  Blood culture (routine x 2)     Status: None (Preliminary result)   Collection Time: 02/12/22  9:57 PM   Specimen: BLOOD  Result Value Ref Range Status   Specimen Description   Final    BLOOD LEFT ANTECUBITAL Performed at [facility],  [place] [address] [place], [place] [address]    Special Requests   Final    BOTTLES DRAWN AEROBIC AND ANAEROBIC Blood Culture results may not be optimal due to an inadequate volume of blood received in culture bottles Performed at [facility] [address] [place], [place] [address]    Culture   Final    NO GROWTH 2 DAYS Performed at [facility] [address], [place], [place] [address]    Report Status PENDING  Incomplete  Blood culture (routine x 2)     Status: None (Preliminary result)   Collection Time: 02/12/22 10:54 PM   Specimen: BLOOD  Result Value Ref Range Status   Specimen Description   Final    BLOOD BLOOD LEFT FOREARM Performed at [facility] [address],  [place], [place] [address]    Special Requests   Final    BOTTLES DRAWN AEROBIC AND ANAEROBIC Blood Culture results may not be optimal due to an excessive volume of blood received in culture bottles Performed at [facility] [address], [place], [place] [address]    Culture   Final    NO GROWTH 2 DAYS Performed at [facility] [address] [place], [place] [address]    Report Status PENDING  Incomplete         Radiology Studies: VAS [place] LOWER EXTREMITY VENOUS (DVT)  Result Date: 02/14/2022  Lower Venous DVT Study Patient Name:  Tamara Johnson  Date of Exam:   02/14/2022 Medical Rec #: [id number]       Accession #:    [id number] Date of Birth: 06-12-1936       Patient Gender: F Patient Age:   72 years Exam Location:  [facility] Procedure:      VAS [place] LOWER EXTREMITY VENOUS (DVT) Referring Phys: Harrell Gave SPONGBERG --------------------------------------------------------------------------------  Indications: Pain, Edema, and Erythema (cellulitis).  Limitations: Poor ultrasound/tissue interface. Comparison Study: No previous exams Performing Technologist: Jody Hill RVT, RDMS  Examination Guidelines: A complete evaluation includes B-mode imaging, spectral Doppler, color Doppler, and power Doppler as needed of all accessible portions of each vessel. Bilateral testing is considered an integral part of a complete examination. Limited examinations for reoccurring indications may be performed as noted. The reflux portion of the exam is performed with the patient in reverse Trendelenburg.  +---------+---------------+---------+-----------+--------------+--------------+ RIGHT    CompressibilityPhasicitySpontaneityProperties    Thrombus Aging +---------+---------------+---------+-----------+--------------+--------------+ CFV      Full           No       Yes        pulsatile flow                +---------+---------------+---------+-----------+--------------+--------------+ SFJ      Full                                                            +---------+---------------+---------+-----------+--------------+--------------+ FV Prox  Full           Yes      Yes                                     +---------+---------------+---------+-----------+--------------+--------------+ FV Mid  Full           Yes      Yes                                     +---------+---------------+---------+-----------+--------------+--------------+ FV DistalFull           Yes      Yes                                     +---------+---------------+---------+-----------+--------------+--------------+ PFV      Full                                                            +---------+---------------+---------+-----------+--------------+--------------+ POP      Full           No       Yes        pulsatile flow               +---------+---------------+---------+-----------+--------------+--------------+ PTV      Full                                                            +---------+---------------+---------+-----------+--------------+--------------+ PERO     Full                                                            +---------+---------------+---------+-----------+--------------+--------------+   +----+---------------+---------+-----------+--------------+--------------+ LEFTCompressibilityPhasicitySpontaneityProperties    Thrombus Aging +----+---------------+---------+-----------+--------------+--------------+ CFV Full           No       Yes        pulsatile flow               +----+---------------+---------+-----------+--------------+--------------+     Summary: RIGHT: - There is no evidence of deep vein thrombosis in the lower extremity.  - No cystic structure found in the popliteal fossa. - Subcutaneous edema extending from popliteal fossa to ankle. -  Ultrasound characteristics of enlarged lymph nodes are noted in the groin.  LEFT: - No evidence of common femoral vein obstruction.  *See table(s) above for measurements and observations. Electronically signed by Monica Martinez MD on 02/14/2022 at 1:49:43 PM.    Final         Scheduled Meds:  aspirin EC  81 mg Oral Daily   atorvastatin  10 mg Oral Daily   buPROPion ER  300 mg Oral Daily   clopidogrel  75 mg Oral Daily   enoxaparin (LOVENOX) injection  40 mg Subcutaneous Q24H   ferrous sulfate  325 mg Oral Q breakfast   levothyroxine  25 mcg Oral q morning   loratadine  10 mg Oral Daily   mometasone-formoterol  2 puff Inhalation BID   And   umeclidinium bromide  1 puff Inhalation Daily  rOPINIRole  0.5 mg Oral QHS   vitamin B-12  500 mcg Oral Daily   [START ON 02/20/2022] Vitamin D (Ergocalciferol)  50,000 Units Oral Weekly   Continuous Infusions:  cefTRIAXone (ROCEPHIN)  IV 2 g (02/14/22 2359)     LOS: 2 days    Time spent: 48 minutes spent on chart review, discussion with nursing staff, consultants, updating family and interview/physical exam; more than 50% of that time was spent in counseling and/or coordination of care.    Earland Reish J [place] DO [facility] Available via Epic secure chat 7am-7pm After these hours, please refer to coverage provider listed on [email or web address] 02/15/2022, 12:12 PM

## 2022-02-15 NOTE — Evaluation (Signed)
Occupational Therapy Evaluation Patient Details Name: Tamara Johnson MRN: [id number] DOB: 10/03/1935 Today's Date: 02/15/2022   History of Present Illness Pt is an 86 yo F admitted to [facility] with RLE cellulitis.  Pt had recent admission to [facility] on 01/04/22 with worsening R leg ulcers and pain due to  peripheral arterial disease and chronic venous insufficiency; 11/30/2021 R SFA stenting. PHI: Cellulitis, CAD, PAD, caudation, previous left femoral to below-knee popliteal artery bypass, tabacco use   Clinical Impression   Patient is currently requiring assistance with ADLs including up to total assist with standing Lower body ADLs and moderate assist with seated LB ADLs, setup assist with seated Upper body ADLs,  as well as  moderate assist with bed mobility when returning to supine. Pt was unable to perform functional transfers to toilet but stood x 2 with moderate assist to RW and took 4 lateral steps with RW and moderate assist.  Current level of function is below patient's typical baseline.  During this evaluation, patient was limited by generalized weakness, impaired activity tolerance, and RLE pain which increased significantly in standing, all of which has the potential to impact patient's safety and independence during functional mobility, as well as performance for ADLs.  Patient lives at home, son who is unable to provide day time supervision and assistance due to working and sister expressed concern of pt's safety when left home alone as pt will get up and "do too much".  Patient demonstrates good rehab potential, and should benefit from continued skilled occupational therapy services while in acute care to maximize safety, independence and quality of life at home.  Continued occupational therapy services in a SNF setting prior to return home is recommended.  ?    Recommendations for follow up therapy are one component of a multi-disciplinary discharge planning process, led by the attending  physician.  Recommendations may be updated based on patient status, additional functional criteria and insurance authorization.   Follow Up Recommendations  Skilled nursing-short term rehab (<3 hours/day)    Assistance Recommended at Discharge Frequent or constant Supervision/Assistance  Patient can return home with the following Two people to help with walking and/or transfers;A lot of help with walking and/or transfers;A lot of help with bathing/dressing/bathroom    Functional Status Assessment  Patient has had a recent decline in their functional status and demonstrates the ability to make significant improvements in function in a reasonable and predictable amount of time.  Equipment Recommendations  Tub/shower seat    Recommendations for Other Services PT consult     Precautions / Restrictions Precautions Precautions: Fall Restrictions Weight Bearing Restrictions: No      Mobility Bed Mobility Overal bed mobility: Needs Assistance Bed Mobility: Supine to Sit, Sit to Supine     Supine to sit: Min guard Sit to supine: Mod assist   General bed mobility comments: Pt able to move from supine to long sitting x 2: Mod I. Min guard at trunk when pivoting to EOB.  Moderate assist needed at RLE and trunk for sit to supine.    Transfers                          Balance Overall balance assessment: Needs assistance   Sitting balance-Leahy Scale: Good     Standing balance support: Bilateral upper extremity supported, Reliant on assistive device for balance Standing balance-Leahy Scale: Poor  ADL either performed or assessed with clinical judgement   ADL Overall ADL's : Needs assistance/impaired Eating/Feeding: Independent;Bed level   Grooming: Wash/dry hands;Wash/dry face;Set up;Sitting   Upper Body Bathing: Min guard;Sitting   Lower Body Bathing: Moderate assistance;Sitting/lateral leans   Upper Body Dressing : Set  up;Sitting   Lower Body Dressing: Total assistance;Sitting/lateral leans;Sit to/from stand;Moderate assistance Lower Body Dressing Details (indicate cue type and reason): Pt able to don LT sock at EOB with setup. Total Assist to don sock to RT foot and Total Assist to don mesh underwear over feet and to don over hips in standing with 2nd person (RN) assisting pt with balance on RW. Toilet Transfer: Moderate assistance;Rolling walker (2 wheels);Minimal assistance Toilet Transfer Details (indicate cue type and reason): Pt stood from low EOB to RW with Minimal assist and cues for hand/foot placement. Pt initiated return to EOB due to RLE pain in standing. IV morphine provided. Once pt reported readiness, performed 2nd stand from elevated EOB with Moderate assist, cues and increased time/effort to bring hands to RW. Pt refused pivot to chair/BSC but able to take 4 lateral steps with RW, cues for sequencing and moderate assist. Pt was not WB through RLE at all, and used UEs to "hop" with LLE. Toileting- Clothing Manipulation and Hygiene: Maximal assistance;Bed level Toileting - Clothing Manipulation Details (indicate cue type and reason): Incontinent of bladder baseline and wears briefs. Now using purewick. Unable to pivot to [facility].     Functional mobility during ADLs: Minimal assistance;Moderate assistance;Rolling walker (2 wheels);Cueing for sequencing;Cueing for safety       Vision Baseline Vision/History: 6 Macular Degeneration;1 Wears glasses Ability to See in Adequate Light: 2 Moderately impaired Patient Visual Report: No change from baseline       Perception     Praxis      Pertinent Vitals/Pain Pain Assessment Pain Assessment: Faces Faces Pain Scale: Hurts worst Pain Location: RT LE Pain Intervention(s): Limited activity within patient's tolerance, RN gave pain meds during session, Monitored during session, Repositioned, Patient requesting pain meds-RN notified (IV morphine provided  after pt indicated 10/10 pain after 1st stand.)     Hand Dominance Right   Extremity/Trunk Assessment Upper Extremity Assessment Upper Extremity Assessment: Overall WFL for tasks assessed   Lower Extremity Assessment Lower Extremity Assessment: Defer to PT evaluation;RLE deficits/detail RLE Deficits / Details: Cellulitis   Cervical / Trunk Assessment Cervical / Trunk Assessment: Kyphotic   Communication Communication Communication: HOH   Cognition Arousal/Alertness: Awake/alert Behavior During Therapy: WFL for tasks assessed/performed, Anxious Overall Cognitive Status: Within Functional Limits for tasks assessed                                 General Comments: Ox4. Very pleasant but anxious of falling.     General Comments       Exercises     Shoulder Instructions      Home Living Family/patient expects to be discharged to:: Skilled nursing facility Living Arrangements: Children (Lives with son who works) Available Help at Discharge: Family;Available PRN/intermittently;Neighbor Type of Home: House Home Access: Level entry     Home Layout: One level     Bathroom Shower/Tub: Occupational psychologist: Handicapped height Bathroom Accessibility: Yes How Accessible: Accessible via walker Home Equipment: Addy (2 wheels);Grab bars - tub/shower;BSC/3in1   Additional Comments: Home O2, 2LPM nights only      Prior Functioning/Environment Prior Level of Function :  Needs assist       Physical Assist : ADLs (physical)   ADLs (physical): IADLs Mobility Comments: pt s HOH and has macular degeneration and cannot read. Pt's sister states that pt gets up while son is at work "and does more than she should." ADLs Comments: Modified Independent with ADL utilizing RW, required assistance with IADLs except for light meal prep        OT Problem List: Impaired vision/perception;Decreased knowledge of use of DME or AE;Impaired balance  (sitting and/or standing);Decreased activity tolerance;Decreased safety awareness;Increased edema;Pain;Cardiopulmonary status limiting activity;Decreased strength      OT Treatment/Interventions: Self-care/ADL training;Therapeutic activities;Visual/perceptual remediation/compensation;Patient/family education;DME and/or AE instruction;Balance training    OT Goals(Current goals can be found in the care plan section) Acute Rehab OT Goals Patient Stated Goal: Sister is requesting Rehab. Pt requesting home health but agreeable to rehab. OT Goal Formulation: With patient/family Time For Goal Achievement: 03/01/22 Potential to Achieve Goals: Good ADL Goals Pt Will Perform Lower Body Bathing: sitting/lateral leans;with supervision;with adaptive equipment Pt Will Perform Lower Body Dressing: with adaptive equipment;with set-up;with supervision;sitting/lateral leans;bed level Pt Will Transfer to Toilet: ambulating;with modified independence Pt Will Perform Toileting - Clothing Manipulation and hygiene: with modified independence;sitting/lateral leans Pt Will Perform Tub/Shower Transfer: with supervision;Shower transfer  OT Frequency: Min 2X/week    Co-evaluation              AM-PAC OT "6 Clicks" Daily Activity     Outcome Measure Help from another person eating meals?: None Help from another person taking care of personal grooming?: A Little Help from another person toileting, which includes using toliet, bedpan, or urinal?: A Lot Help from another person bathing (including washing, rinsing, drying)?: A Lot Help from another person to put on and taking off regular upper body clothing?: A Little Help from another person to put on and taking off regular lower body clothing?: A Lot 6 Click Score: 16   End of Session Equipment Utilized During Treatment: Gait belt;Oxygen;Rolling walker (2 wheels) Nurse Communication: Mobility status;Other (comment) (RN assisted with mobility)  Activity  Tolerance: Patient limited by pain Patient left: in bed;with call bell/phone within reach;with bed alarm set;with family/visitor present  OT Visit Diagnosis: Unsteadiness on feet (R26.81);Pain;Low vision, both eyes (H54.2) Pain - Right/Left: Right Pain - part of body: Leg;Ankle and joints of foot                Time: 1106-1140 OT Time Calculation (min): 34 min Charges:  OT General Charges $OT Visit: 1 Visit OT Evaluation $OT Eval Low Complexity: 1 Low OT Treatments $Self Care/Home Management : 8-22 mins  Anderson Malta, OT Acute Rehab Services Office: [phone] 02/15/2022  Julien Girt 02/15/2022, 12:35 PM

## 2022-02-15 NOTE — Evaluation (Signed)
Physical Therapy Evaluation Patient Details Name: Tamara Johnson MRN: [id number] DOB: 28-Nov-1935 Today's Date: 02/15/2022  History of Present Illness  Pt is an 86 yo F admitted to [facility]/ with RLE cellulitis.  Pt had recent admission to [facility] on 01/04/22 with worsening R leg ulcers and pain due to  peripheral arterial disease and chronic venous insufficiency; 11/30/2021 R SFA stenting. PHI: Cellulitis, CAD, PAD, caudation, previous left femoral to below-knee popliteal artery bypass, tabacco use  Clinical Impression   Patient tolerated sitting onto bed edge, reporting  right leg pain too great to stand. Patient with noted wheezing  with activity, on 2 L [place] , SPO2 94%.    Patient was ambulatory with RW at DC from [facility].Patient currently far from baseline .  /Pt admitted with above diagnosis.  Pt currently with functional limitations due to the deficits listed below (see PT Problem List). Pt will benefit from skilled PT to increase their independence and safety with mobility to allow discharge to the venue listed below.           Recommendations for follow up therapy are one component of a multi-disciplinary discharge planning process, led by the attending physician.  Recommendations may be updated based on patient status, additional functional criteria and insurance authorization.  Follow Up Recommendations Skilled nursing-short term rehab (<3 hours/day) Can patient physically be transported by private vehicle: No    Assistance Recommended at Discharge Frequent or constant Supervision/Assistance  Patient can return home with the following  A little help with walking and/or transfers;A little help with bathing/dressing/bathroom;Help with stairs or ramp for entrance;Assistance with cooking/housework;Assist for transportation    Equipment Recommendations None recommended by PT  Recommendations for Other Services       Functional Status Assessment Patient has had a recent decline in their functional  status and demonstrates the ability to make significant improvements in function in a reasonable and predictable amount of time.     Precautions / Restrictions Precautions Precautions: Fall Restrictions Weight Bearing Restrictions: No      Mobility  Bed Mobility   Bed Mobility: Supine to Sit, Sit to Supine     Supine to sit: Min guard, HOB elevated Sit to supine: Min assist   General bed mobility comments: Pt able to move from supine to sit onto bed edge and support right foot  for leg back onto bed.    Transfers                   General transfer comment: deferred due to pt  C/o pain RLE    Ambulation/Gait                  Stairs            Wheelchair Mobility    Modified Rankin (Stroke Patients Only)       Balance Overall balance assessment: Needs assistance   Sitting balance-Leahy Scale: Good         Standing balance comment: NT                             Pertinent Vitals/Pain Pain Assessment Faces Pain Scale: Hurts worst Pain Location: RT LE Pain Descriptors / Indicators: Aching, Discomfort, Grimacing Pain Intervention(s): Patient requesting pain meds-RN notified, Monitored during session, Limited activity within patient's tolerance    Home Living Family/patient expects to be discharged to:: Skilled nursing facility Living Arrangements: Children Available Help at Discharge: Family;Available PRN/intermittently;Neighbor Type  of Home: House Home Access: Level entry       Home Layout: One level Home Equipment: Conservation officer, nature (2 wheels);Grab bars - tub/shower;BSC/3in1 Additional Comments: Home O2, 2LPM nights only    Prior Function Prior Level of Function : Needs assist       Physical Assist : ADLs (physical)   ADLs (physical): IADLs Mobility Comments: pt s HOH and has macular degeneration and cannot read. ADLs Comments: Modified Independent with ADL utilizing RW, required assistance with IADLs except for  light meal prep     Hand Dominance   Dominant Hand: Right    Extremity/Trunk Assessment   Upper Extremity Assessment Upper Extremity Assessment: Overall WFL for tasks assessed    Lower Extremity Assessment Lower Extremity Assessment: RLE deficits/detail RLE Deficits / Details: edema and reddness, demarcated to knee    Cervical / Trunk Assessment Cervical / Trunk Assessment: Kyphotic  Communication   Communication: HOH  Cognition Arousal/Alertness: Awake/alert Behavior During Therapy: WFL for tasks assessed/performed, Anxious Overall Cognitive Status: Within Functional Limits for tasks assessed                                          General Comments      Exercises     Assessment/Plan    PT Assessment Patient needs continued PT services  PT Problem List Decreased strength;Decreased mobility;Decreased knowledge of precautions;Decreased activity tolerance;Decreased skin integrity;Decreased balance;Pain;Decreased knowledge of use of DME       PT Treatment Interventions DME instruction;Therapeutic activities;Gait training;Therapeutic exercise;Patient/family education;Functional mobility training    PT Goals (Current goals can be found in the Care Plan section)  Acute Rehab PT Goals Patient Stated Goal: to go to rehab, for  leg to heal PT Goal Formulation: With patient Time For Goal Achievement: 03/01/22 Potential to Achieve Goals: Fair    Frequency Min 2X/week     Co-evaluation               AM-PAC PT "6 Clicks" Mobility  Outcome Measure Help needed turning from your back to your side while in a flat bed without using bedrails?: A Little Help needed moving from lying on your back to sitting on the side of a flat bed without using bedrails?: A Little Help needed moving to and from a bed to a chair (including a wheelchair)?: Total Help needed standing up from a chair using your arms (e.g., wheelchair or bedside chair)?: Total Help needed  to walk in hospital room?: Total Help needed climbing 3-5 steps with a railing? : Total 6 Click Score: 10    End of Session Equipment Utilized During Treatment: Oxygen Activity Tolerance: Patient limited by fatigue;Patient limited by pain;Treatment limited secondary to medical complications (Comment) Patient left: in bed;with call bell/phone within reach;with bed alarm set Nurse Communication: Mobility status PT Visit Diagnosis: Unsteadiness on feet (R26.81);Pain;Muscle weakness (generalized) (M62.81);Difficulty in walking, not elsewhere classified (R26.2) Pain - Right/Left: Right Pain - part of body: Leg    Time: 1410-1427 PT Time Calculation (min) (ACUTE ONLY): 17 min   Charges:   PT Evaluation $PT Eval Low Complexity: 1 Low          [place] Office [phone] Weekend [phone]   Claretha Cooper 02/15/2022, 2:37 PM

## 2022-02-16 DIAGNOSIS — L03115 Cellulitis of right lower limb: Secondary | ICD-10-CM | POA: Diagnosis not present

## 2022-02-16 LAB — MRSA NEXT GEN BY PCR, NASAL: MRSA by PCR Next Gen: DETECTED — AB

## 2022-02-16 LAB — BASIC METABOLIC PANEL
Anion gap: 8 (ref 5–15)
BUN: 11 mg/dL (ref 8–23)
CO2: 24 mmol/L (ref 22–32)
Calcium: 7.7 mg/dL — ABNORMAL LOW (ref 8.9–10.3)
Chloride: 104 mmol/L (ref 98–111)
Creatinine, Ser: 0.61 mg/dL (ref 0.44–1.00)
GFR, Estimated: >60 mL/min (ref >60)
Glucose, Bld: 103 mg/dL — ABNORMAL HIGH (ref 70–99)
Potassium: 3.5 mmol/L (ref 3.5–5.1)
Sodium: 136 mmol/L (ref 135–145)

## 2022-02-16 LAB — CBC
HCT: 25.1 % — ABNORMAL LOW (ref 36.0–46.0)
Hemoglobin: 7.8 g/dL — ABNORMAL LOW (ref 12.0–15.0)
MCH: 30.2 pg (ref 26.0–34.0)
MCHC: 31.1 g/dL (ref 30.0–36.0)
MCV: 97.3 fL (ref 80.0–100.0)
Platelets: 266 10*3/uL (ref 150–400)
RBC: 2.58 MIL/uL — ABNORMAL LOW (ref 3.87–5.11)
RDW: 16.1 % — ABNORMAL HIGH (ref 11.5–15.5)
WBC: 8.7 10*3/uL (ref 4.0–10.5)
nRBC: 0 % (ref 0.0–0.2)

## 2022-02-16 MED ORDER — DOXYCYCLINE HYCLATE 100 MG PO TABS
100.0000 mg | ORAL_TABLET | Freq: Two times a day (BID) | ORAL | Status: DC
  Administered 2022-02-16 – 2022-02-18 (×4): 100 mg via ORAL
  Filled 2022-02-16 (×4): qty 1

## 2022-02-16 MED ORDER — MEDIHONEY WOUND/BURN DRESSING EX PSTE
1.0000 | PASTE | Freq: Every day | CUTANEOUS | Status: DC
  Administered 2022-02-16 – 2022-02-18 (×3): 1 via TOPICAL
  Filled 2022-02-16: qty 44

## 2022-02-16 MED ORDER — SODIUM CHLORIDE 0.9 % IV SOLN: INTRAVENOUS | Status: DC | PRN

## 2022-02-16 NOTE — Consult Note (Signed)
[facility] Nurse Consult Note: Reason for Consult:right foot and ankle Long standing history of right lateral malleolar wound, followed by [facility] New onset right pretibial wound 10/21/21 New onset of right medial malleolar wound 01/04/22 Wound type: Full thickness mixed etiology ulcerations of the RLE.  Pressure Injury POA: NA Measurement:see nursing flow sheets, all of the wounds less than 1cm and less than 0.5 cm in depth Wound bed: R lateral ankle x 3 (2 proximal x 2 95% clean), 1 medial 100% clean  Right medial malleoloar; 90% fibrinous/10% pink Right pretibial; 100% pink Drainage (amount, consistency, odor) see nursing flow sheets  Periwound: cellulitis, acute onset  Dressing procedure/placement/frequency: Apply Medihoney to the right and left medial malleolar wounds, top with foam dressing.  Silicone foam only to the right pretibial Change daily.  Follow up with outpatient wound care center and [facility] as scheduled at DC.    Re consult if needed, will not follow at this time. Thanks  Kysa Calais [employer], RN,CWOCN, CNS, [place] [phone])

## 2022-02-16 NOTE — Progress Notes (Signed)
PROGRESS NOTE    Tamara Johnson  SRP:[id number] DOB: [birth year] DOA: 02/12/2022 PCP: Bonnita Nasuti, MD   Brief Narrative: 86 year old with past medical history significant for PAD/PVD status post stent right lower extremity, HLD, COPD on 2 L of oxygen at baseline, hypothyroidism, RLS who presented to [facility] on 7/8 with progressive edema and redness of her right lower extremity extending from her foot to the knee.    Patient admitted with right lower extremity cellulitis, x-ray of the right tibia-fibula with no acute osseous abnormality, considerable soft tissue edema with ulcer wounds medial and lateral aspect of the ankle.  CT tibia-fibula with diffuse lower extremity edema which may be infectious, no abscess or abnormal enhancement, no acute osseous abnormalities.     Assessment & Plan:   Principal Problem:   Cellulitis of right lower extremity Active Problems:   PAD (peripheral artery disease) (HCC)   Hypothyroidism   Tobacco abuse   Dyslipidemia   Chronic obstructive pulmonary disease (COPD) (HCC)   1-Right lower extremity cellulitis: -Patient presenting with progressive swelling and erythema of right lower extremity, extending from foot to knee. -Recently underwent stent placement of right lower extremity due to her underlying peripheral vascular disease. -She presented with a leukocytosis, trending down.  -Doppler negative for DVT -Continue with IV ceftriaxone. -Noticed some improvement of lower extremity redness. -Wound care consulted -leg elevation.  -will add doxy  2-peripheral artery disease: Follow-up with vascular surgery outpatient.  Recent stent placed right lower extremity Continue with aspirin and Plavix  Hypothyroidism: Continue with Synthroid  COPD, chronic hypoxic respiratory failure Continue with DuoNeb, Dulera and Incruse Ellipta Continue with chronic 2 L oxygen supplementation  Hyperlipidemia: Continue with statins  Anxiety/depression:  Continue with Wellbutrin  Restless leg syndrome:  continue with ropinirole B12 deficiency:  continue with B12 supplements Vitamin D deficiency: continue with supplement Weakness debility: Plan to for rehab  Anemia; monitor hb.    Estimated body mass index is 18.86 kg/m as calculated from the following:   Height as of this encounter: '5\' 6"'$  (1.676 m).   Weight as of this encounter: 53 kg.   DVT prophylaxis: Lovenox Code Status: DNR Family Communication: Disposition Plan:  Status is: Inpatient Remains inpatient appropriate because: awaiting improvement of cellulitis.     Consultants:  none  Procedures:  Doppler.   Antimicrobials:    Subjective: She is hard of hearing. Report pain LE. Relates redness has improved.   Objective: Vitals:   02/16/22 0550 02/16/22 0723 02/16/22 0724 02/16/22 1348  BP: 140/64   (!) 116/48  Pulse: 79   80  Resp: 18   20  Temp: 99.2 F (37.3 C)   99 F (37.2 C)  TempSrc: Oral   Oral  SpO2: 90% 92% 92% 99%  Weight:      Height:        Intake/Output Summary (Last 24 hours) at 02/16/2022 1620 Last data filed at 02/16/2022 1400 Gross per 24 hour  Intake 1420 ml  Output 450 ml  Net 970 ml   Filed Weights   02/13/22 0357  Weight: 53 kg    Examination:  General exam: Appears calm and comfortable  Respiratory system: Clear to auscultation. Respiratory effort normal. Cardiovascular system: S1 & S2 heard, RRR. No JVD, murmurs, rubs, gallops or clicks. No pedal edema. Gastrointestinal system: Abdomen is nondistended, soft and nontender. No organomegaly or masses felt. Normal bowel sounds heard. Central nervous system: Alert and oriented. No focal neurological deficits. Extremities: right LE with  edema, redness, drainage wound.   Data Reviewed: I have personally reviewed following labs and imaging studies  CBC: Recent Labs  Lab 02/12/22 2156 02/13/22 0300 02/14/22 0443 02/15/22 0445 02/16/22 0515  WBC 16.9* 15.1* 13.6* 9.2 8.7   NEUTROABS 14.8*  --  10.8* 6.9  --   HGB 9.9* 8.6* 8.8* 8.1* 7.8*  HCT 31.8* 28.0* 29.1* 26.6* 25.1*  MCV 98.5 98.2 98.3 97.8 97.3  PLT 247 230 246 242 161   Basic Metabolic Panel: Recent Labs  Lab 02/12/22 2156 02/13/22 0300 02/14/22 0443 02/15/22 0445 02/16/22 0515  NA 139 140 138 140 136  K 3.5 3.7 3.9 3.8 3.5  CL 106 109 106 108 104  CO2 '24 22 23 25 24  '$ GLUCOSE 105* 120* 89 101* 103*  BUN 29* 28* '22 16 11  '$ CREATININE 0.80 0.76 0.70 0.68 0.61  CALCIUM 8.6* 8.2* 7.8* 7.6* 7.7*   GFR: Estimated Creatinine Clearance: 44.6 mL/min (by C-G formula based on SCr of 0.61 mg/dL). Liver Function Tests: Recent Labs  Lab 02/12/22 2156  AST 27  ALT 25  ALKPHOS 105  BILITOT 0.8  PROT 6.9  ALBUMIN 3.0*   No results for input(s): "LIPASE", "AMYLASE" in the last 168 hours. No results for input(s): "AMMONIA" in the last 168 hours. Coagulation Profile: Recent Labs  Lab 02/12/22 2156  INR 1.0   Cardiac Enzymes: No results for input(s): "CKTOTAL", "CKMB", "CKMBINDEX", "TROPONINI" in the last 168 hours. BNP (last 3 results) No results for input(s): "PROBNP" in the last 8760 hours. HbA1C: No results for input(s): "HGBA1C" in the last 72 hours. CBG: No results for input(s): "GLUCAP" in the last 168 hours. Lipid Profile: No results for input(s): "CHOL", "HDL", "LDLCALC", "TRIG", "CHOLHDL", "LDLDIRECT" in the last 72 hours. Thyroid Function Tests: No results for input(s): "TSH", "T4TOTAL", "FREET4", "T3FREE", "THYROIDAB" in the last 72 hours. Anemia Panel: No results for input(s): "VITAMINB12", "FOLATE", "FERRITIN", "TIBC", "IRON", "RETICCTPCT" in the last 72 hours. Sepsis Labs: Recent Labs  Lab 02/12/22 0300 02/12/22 2156  LATICACIDVEN 1.7 1.4    Recent Results (from the past 240 hour(s))  Blood culture (routine x 2)     Status: None (Preliminary result)   Collection Time: 02/12/22  9:57 PM   Specimen: BLOOD  Result Value Ref Range Status   Specimen Description    Final    BLOOD LEFT ANTECUBITAL Performed at [facility] [address], [place], [place] [address]    Special Requests   Final    BOTTLES DRAWN AEROBIC AND ANAEROBIC Blood Culture results may not be optimal due to an inadequate volume of blood received in culture bottles Performed at [facility] [address], [place], [place] [address]    Culture   Final    NO GROWTH 3 DAYS Performed at [facility], [place] [address] [place], [place] [address]    Report Status PENDING  Incomplete  Blood culture (routine x 2)     Status: None (Preliminary result)   Collection Time: 02/12/22 10:54 PM   Specimen: BLOOD  Result Value Ref Range Status   Specimen Description   Final    BLOOD BLOOD LEFT FOREARM Performed at [facility] [address], [place], [place] [address]    Special Requests   Final    BOTTLES DRAWN AEROBIC AND ANAEROBIC Blood Culture results may not be optimal due to an excessive volume of blood received in culture bottles Performed at [facility],  [place] [address] [place], [place] [address]    Culture   Final    NO GROWTH 3 DAYS Performed at [facility], [place] [address], [place], [place] [address]    Report Status PENDING  Incomplete  MRSA Next Gen by PCR, Nasal     Status: Abnormal   Collection Time: 02/16/22 12:27 PM   Specimen: Nasal Mucosa; Nasal Swab  Result Value Ref Range Status   MRSA by PCR Next Gen DETECTED (A) NOT DETECTED Final    Comment: (NOTE) The GeneXpert MRSA Assay (FDA approved for NASAL specimens only), is one component of a comprehensive MRSA colonization surveillance program. It is not intended to diagnose MRSA infection nor to guide or monitor treatment for MRSA infections. Test performance is not FDA approved in patients less than 29 years old. Performed at [facility], [place] [address] [place], [place] [address]           Radiology Studies: No results found.      Scheduled Meds:  aspirin EC  81 mg Oral Daily   atorvastatin  10 mg Oral Daily   buPROPion ER  300 mg Oral Daily   clopidogrel  75 mg Oral Daily   enoxaparin (LOVENOX) injection  40 mg Subcutaneous Q24H   ferrous sulfate  325 mg Oral Q breakfast   leptospermum manuka honey  1 Application Topical Daily   levothyroxine  25 mcg Oral q morning   loratadine  10 mg Oral Daily   mometasone-formoterol  2 puff Inhalation BID   And   umeclidinium bromide  1 puff Inhalation Daily   rOPINIRole  0.5 mg Oral QHS   vitamin B-12  500 mcg Oral Daily   [START ON 02/20/2022] Vitamin D (Ergocalciferol)  50,000 Units Oral Weekly   Continuous Infusions:  cefTRIAXone (ROCEPHIN)  IV 2 g (02/16/22 0018)     LOS: 3 days    Time spent: 35 minutes.     Elmarie Shiley, MD [facility]   If 7PM-7AM, please contact night-coverage [email or web address]  02/16/2022, 4:20 PM

## 2022-02-17 ENCOUNTER — Inpatient Hospital Stay (HOSPITAL_COMMUNITY): Payer: Medicare Other

## 2022-02-17 DIAGNOSIS — L03115 Cellulitis of right lower limb: Secondary | ICD-10-CM | POA: Diagnosis not present

## 2022-02-17 LAB — CBC
HCT: 25.1 % — ABNORMAL LOW (ref 36.0–46.0)
Hemoglobin: 7.7 g/dL — ABNORMAL LOW (ref 12.0–15.0)
MCH: 30.4 pg (ref 26.0–34.0)
MCHC: 30.7 g/dL (ref 30.0–36.0)
MCV: 99.2 fL (ref 80.0–100.0)
Platelets: 321 10*3/uL (ref 150–400)
RBC: 2.53 MIL/uL — ABNORMAL LOW (ref 3.87–5.11)
RDW: 16 % — ABNORMAL HIGH (ref 11.5–15.5)
WBC: 7.9 10*3/uL (ref 4.0–10.5)
nRBC: 0 % (ref 0.0–0.2)

## 2022-02-17 LAB — BASIC METABOLIC PANEL
Anion gap: 8 (ref 5–15)
BUN: 13 mg/dL (ref 8–23)
CO2: 25 mmol/L (ref 22–32)
Calcium: 7.8 mg/dL — ABNORMAL LOW (ref 8.9–10.3)
Chloride: 105 mmol/L (ref 98–111)
Creatinine, Ser: 0.64 mg/dL (ref 0.44–1.00)
GFR, Estimated: >60 mL/min (ref >60)
Glucose, Bld: 110 mg/dL — ABNORMAL HIGH (ref 70–99)
Potassium: 3.8 mmol/L (ref 3.5–5.1)
Sodium: 138 mmol/L (ref 135–145)

## 2022-02-17 MED ORDER — POLYETHYLENE GLYCOL 3350 17 G PO PACK
17.0000 g | PACK | Freq: Every day | ORAL | Status: DC
Start: 2022-02-17 — End: 2022-02-18
  Administered 2022-02-17 – 2022-02-18 (×2): 17 g via ORAL
  Filled 2022-02-17 (×2): qty 1

## 2022-02-17 MED ORDER — POTASSIUM CHLORIDE 20 MEQ PO PACK
40.0000 meq | PACK | Freq: Once | ORAL | Status: AC
Start: 2022-02-17 — End: 2022-02-17
  Administered 2022-02-17: 40 meq via ORAL
  Filled 2022-02-17: qty 2

## 2022-02-17 MED ORDER — FUROSEMIDE 10 MG/ML IJ SOLN
20.0000 mg | Freq: Once | INTRAMUSCULAR | Status: AC
Start: 2022-02-17 — End: 2022-02-17
  Administered 2022-02-17: 20 mg via INTRAVENOUS
  Filled 2022-02-17: qty 2

## 2022-02-17 NOTE — Progress Notes (Signed)
Physical Therapy Treatment Patient Details Name: Tamara Johnson MRN: [id number] DOB: 02-Jul-1936 Today's Date: 02/17/2022   History of Present Illness Pt is an 86 yo F admitted to [facility]7/8/ with RLE cellulitis.  Pt had recent admission to [facility] on 01/04/22 with worsening R leg ulcers and pain due to  peripheral arterial disease and chronic venous insufficiency; 11/30/2021 R SFA stenting. PHI: Cellulitis, CAD, PAD, caudation, previous left femoral to below-knee popliteal artery bypass, tabacco use    PT Comments    Pt received in bed, pleasantly declined any mobility stating she was still having a great deal of leg pain. Therapist educated pt on the benefits of mobility and reviewed exercise handout for B LE's. Pt stated good understanding and agreed to attempt OOB next attempt.  Continue PT per POC, d/c to SNF once medically stable.   Recommendations for follow up therapy are one component of a multi-disciplinary discharge planning process, led by the attending physician.  Recommendations may be updated based on patient status, additional functional criteria and insurance authorization.  Follow Up Recommendations  Skilled nursing-short term rehab (<3 hours/day) Can patient physically be transported by private vehicle: No   Assistance Recommended at Discharge Frequent or constant Supervision/Assistance  Patient can return home with the following A little help with walking and/or transfers;A little help with bathing/dressing/bathroom;Help with stairs or ramp for entrance;Assistance with cooking/housework;Assist for transportation   Equipment Recommendations  None recommended by PT    Recommendations for Other Services       Precautions / Restrictions Precautions Precautions: Fall Restrictions Weight Bearing Restrictions: No     Mobility  Bed Mobility                    Transfers                   General transfer comment: deferred due to pt  C/o pain RLE     Ambulation/Gait               General Gait Details:  (Declined)   Stairs             Wheelchair Mobility    Modified Rankin (Stroke Patients Only)       Balance                                            Cognition Arousal/Alertness: Awake/alert Behavior During Therapy: WFL for tasks assessed/performed, Anxious Overall Cognitive Status: Within Functional Limits for tasks assessed                                 General Comments: Ox4. Very pleasant but anxious of falling.        Exercises      General Comments General comments (skin integrity, edema, etc.):  (Pt educated on importance of mobility and benefits of out of bed activities)      Pertinent Vitals/Pain Pain Assessment Pain Assessment: 0-10 Pain Score: 9  Pain Location: RT LE Pain Descriptors / Indicators: Aching, Discomfort, Grimacing Pain Intervention(s): Limited activity within patient's tolerance    Home Living                          Prior Function  PT Goals (current goals can now be found in the care plan section) Acute Rehab PT Goals Patient Stated Goal: to go to rehab, for  leg to heal    Frequency    Min 2X/week      PT Plan Current plan remains appropriate    Co-evaluation              AM-PAC PT "6 Clicks" Mobility   Outcome Measure  Help needed turning from your back to your side while in a flat bed without using bedrails?: A Little Help needed moving from lying on your back to sitting on the side of a flat bed without using bedrails?: A Little Help needed moving to and from a bed to a chair (including a wheelchair)?: Total Help needed standing up from a chair using your arms (e.g., wheelchair or bedside chair)?: Total Help needed to walk in hospital room?: Total Help needed climbing 3-5 steps with a railing? : Total 6 Click Score: 10    End of Session Equipment Utilized During Treatment:  Oxygen Activity Tolerance: Patient limited by fatigue;Patient limited by pain;Treatment limited secondary to medical complications (Comment) Patient left: in bed;with call bell/phone within reach;with bed alarm set Nurse Communication: Mobility status PT Visit Diagnosis: Unsteadiness on feet (R26.81);Pain;Muscle weakness (generalized) (M62.81);Difficulty in walking, not elsewhere classified (R26.2) Pain - Right/Left: Right Pain - part of body: Leg     Time: 1300-1315 PT Time Calculation (min) (ACUTE ONLY): 15 min  Charges:  $Therapeutic Exercise: 8-22 mins                    Mikel Cella, PTA    Tamara Johnson 02/17/2022, 1:39 PM

## 2022-02-17 NOTE — Progress Notes (Signed)
PROGRESS NOTE    Tamara Johnson  ZOX:[id number] DOB: 08-18-1935 DOA: 02/12/2022 PCP: Bonnita Nasuti, MD   Brief Narrative: 86 year old with past medical history significant for PAD/PVD status post stent right lower extremity, HLD, COPD on 2 L of oxygen at baseline, hypothyroidism, RLS who presented to [facility] on 7/8 with progressive edema and redness of her right lower extremity extending from her foot to the knee.    Patient admitted with right lower extremity cellulitis, x-ray of the right tibia-fibula with no acute osseous abnormality, considerable soft tissue edema with ulcer wounds medial and lateral aspect of the ankle.  CT tibia-fibula with diffuse lower extremity edema which may be infectious, no abscess or abnormal enhancement, no acute osseous abnormalities.   Assessment & Plan:   Principal Problem:   Cellulitis of right lower extremity Active Problems:   PAD (peripheral artery disease) (HCC)   Hypothyroidism   Tobacco abuse   Dyslipidemia   Chronic obstructive pulmonary disease (COPD) (HCC)   1-Right lower extremity cellulitis: -Patient presenting with progressive swelling and erythema of right lower extremity, extending from foot to knee. -Recently underwent stent placement of right lower extremity due to her underlying peripheral vascular disease. -She presented with a leukocytosis, trending down.  -Doppler negative for DVT -Continue with IV ceftriaxone. -Wound care consulted, continue with local care.  -leg elevation.  -continue with Doxy.  Redness has improved on her leg, now redness persist foot.  Might be able to be transfer to SNF for rehab soon.   2-peripheral artery disease: Follow-up with vascular surgery outpatient.  Recent stent placed right lower extremity Continue with aspirin and Plavix  Hypothyroidism: Continue with Synthroid  COPD, chronic hypoxic respiratory failure Continue with DuoNeb, Dulera and Incruse Ellipta Continue with chronic 2 L  oxygen supplementation  Hyperlipidemia: Continue with statins  Anxiety/depression: Continue with Wellbutrin  Restless leg syndrome:  continue with ropinirole B12 deficiency:  continue with B12 supplements Vitamin D deficiency: continue with supplement Weakness debility: Plan to for rehab  Anemia; monitor hb. No evidence of active bleeding.  Continue with ferrous sulfate.  Check occult blood.  Repeat hb tomorrow.   Per family she had abnormal CT chest  "some spot in the lung that will need to be evaluated". Will proceed with chest x ray, but patient will need to follow up with pulmonologist    Estimated body mass index is 18.86 kg/m as calculated from the following:   Height as of this encounter: '5\' 6"'$  (1.676 m).   Weight as of this encounter: 53 kg.   DVT prophylaxis: Lovenox Code Status: DNR Family Communication: son over phone.  Disposition Plan:  Status is: Inpatient Remains inpatient appropriate because: awaiting improvement of cellulitis.     Consultants:  none  Procedures:  Doppler.   Antimicrobials:    Subjective: She has not had BM.  No rectal bleeding.  LE redness decreasing.   Objective: Vitals:   02/16/22 1348 02/16/22 2019 02/17/22 0545 02/17/22 0740  BP: (!) 116/48 (!) 130/57 (!) 123/49   Pulse: 80 78 77   Resp: '20 18 18   '$ Temp: 99 F (37.2 C) 98.5 F (36.9 C) 98.4 F (36.9 C)   TempSrc: Oral Oral Oral   SpO2: 99% 95% (!) 83% 94%  Weight:      Height:        Intake/Output Summary (Last 24 hours) at 02/17/2022 1100 Last data filed at 02/17/2022 1000 Gross per 24 hour  Intake 1456.52 ml  Output 950  ml  Net 506.52 ml    Filed Weights   02/13/22 0357  Weight: 53 kg    Examination:  General exam: NAD Respiratory system: CTA Cardiovascular system: S 1, S 2 RRR Gastrointestinal system: BS present, soft, nt Central nervous system: alert.  Extremities: right LE with edema, redness, drainage wound.   Data Reviewed: I have  personally reviewed following labs and imaging studies  CBC: Recent Labs  Lab 02/12/22 2156 02/13/22 0300 02/14/22 0443 02/15/22 0445 02/16/22 0515 02/17/22 0700  WBC 16.9* 15.1* 13.6* 9.2 8.7 7.9  NEUTROABS 14.8*  --  10.8* 6.9  --   --   HGB 9.9* 8.6* 8.8* 8.1* 7.8* 7.7*  HCT 31.8* 28.0* 29.1* 26.6* 25.1* 25.1*  MCV 98.5 98.2 98.3 97.8 97.3 99.2  PLT 247 230 246 242 266 536    Basic Metabolic Panel: Recent Labs  Lab 02/13/22 0300 02/14/22 0443 02/15/22 0445 02/16/22 0515 02/17/22 0428  NA 140 138 140 136 138  K 3.7 3.9 3.8 3.5 3.8  CL 109 106 108 104 105  CO2 '22 23 25 24 25  '$ GLUCOSE 120* 89 101* 103* 110*  BUN 28* '22 16 11 13  '$ CREATININE 0.76 0.70 0.68 0.61 0.64  CALCIUM 8.2* 7.8* 7.6* 7.7* 7.8*    GFR: Estimated Creatinine Clearance: 44.6 mL/min (by C-G formula based on SCr of 0.64 mg/dL). Liver Function Tests: Recent Labs  Lab 02/12/22 2156  AST 27  ALT 25  ALKPHOS 105  BILITOT 0.8  PROT 6.9  ALBUMIN 3.0*    No results for input(s): "LIPASE", "AMYLASE" in the last 168 hours. No results for input(s): "AMMONIA" in the last 168 hours. Coagulation Profile: Recent Labs  Lab 02/12/22 2156  INR 1.0    Cardiac Enzymes: No results for input(s): "CKTOTAL", "CKMB", "CKMBINDEX", "TROPONINI" in the last 168 hours. BNP (last 3 results) No results for input(s): "PROBNP" in the last 8760 hours. HbA1C: No results for input(s): "HGBA1C" in the last 72 hours. CBG: No results for input(s): "GLUCAP" in the last 168 hours. Lipid Profile: No results for input(s): "CHOL", "HDL", "LDLCALC", "TRIG", "CHOLHDL", "LDLDIRECT" in the last 72 hours. Thyroid Function Tests: No results for input(s): "TSH", "T4TOTAL", "FREET4", "T3FREE", "THYROIDAB" in the last 72 hours. Anemia Panel: No results for input(s): "VITAMINB12", "FOLATE", "FERRITIN", "TIBC", "IRON", "RETICCTPCT" in the last 72 hours. Sepsis Labs: Recent Labs  Lab 02/12/22 0300 02/12/22 2156  LATICACIDVEN 1.7  1.4     Recent Results (from the past 240 hour(s))  Blood culture (routine x 2)     Status: None (Preliminary result)   Collection Time: 02/12/22  9:57 PM   Specimen: BLOOD  Result Value Ref Range Status   Specimen Description   Final    BLOOD LEFT ANTECUBITAL Performed at [facility] [address], [place], [place] [address]    Special Requests   Final    BOTTLES DRAWN AEROBIC AND ANAEROBIC Blood Culture results may not be optimal due to an inadequate volume of blood received in culture bottles Performed at [facility] [address] [place], [place] [address]    Culture   Final    NO GROWTH 4 DAYS Performed at [facility], [place] [address] [place], [place] [address]    Report Status PENDING  Incomplete  Blood culture (routine x 2)     Status: None (Preliminary result)   Collection Time: 02/12/22 10:54 PM   Specimen: BLOOD  Result Value Ref Range Status  Specimen Description   Final    BLOOD BLOOD LEFT FOREARM Performed at [facility] [address], [place], [place] [address]    Special Requests   Final    BOTTLES DRAWN AEROBIC AND ANAEROBIC Blood Culture results may not be optimal due to an excessive volume of blood received in culture bottles Performed at [facility] [address], [place], [place] [address]    Culture   Final    NO GROWTH 4 DAYS Performed at [facility], [place] [address], [place], [place] [address]    Report Status PENDING  Incomplete  MRSA Next Gen by PCR, Nasal     Status: Abnormal   Collection Time: 02/16/22 12:27 PM   Specimen: Nasal Mucosa; Nasal Swab  Result Value Ref Range Status   MRSA by PCR Next Gen DETECTED (A) NOT DETECTED Final    Comment: (NOTE) The GeneXpert MRSA Assay (FDA approved for NASAL specimens only), is one component of a comprehensive MRSA colonization surveillance program. It is not intended to diagnose MRSA infection nor to  guide or monitor treatment for MRSA infections. Test performance is not FDA approved in patients less than 52 years old. Performed at [facility], [place] [address], [place],  [address]          Radiology Studies: No results found.      Scheduled Meds:  aspirin EC  81 mg Oral Daily   atorvastatin  10 mg Oral Daily   buPROPion ER  300 mg Oral Daily   clopidogrel  75 mg Oral Daily   doxycycline  100 mg Oral Q12H   ferrous sulfate  325 mg Oral Q breakfast   leptospermum manuka honey  1 Application Topical Daily   levothyroxine  25 mcg Oral q morning   loratadine  10 mg Oral Daily   mometasone-formoterol  2 puff Inhalation BID   And   umeclidinium bromide  1 puff Inhalation Daily   polyethylene glycol  17 g Oral Daily   rOPINIRole  0.5 mg Oral QHS   vitamin B-12  500 mcg Oral Daily   [START ON 02/20/2022] Vitamin D (Ergocalciferol)  50,000 Units Oral Weekly   Continuous Infusions:  sodium chloride 10 mL/hr at 02/16/22 1750   cefTRIAXone (ROCEPHIN)  IV 2 g (02/17/22 0018)     LOS: 4 days    Time spent: 35 minutes.     Elmarie Shiley, MD [facility]   If 7PM-7AM, please contact night-coverage [email or web address]  02/17/2022, 11:00 AM

## 2022-02-18 ENCOUNTER — Other Ambulatory Visit (HOSPITAL_COMMUNITY): Payer: Self-pay

## 2022-02-18 ENCOUNTER — Inpatient Hospital Stay (HOSPITAL_COMMUNITY): Payer: Medicare Other

## 2022-02-18 LAB — CBC
HCT: 26.1 % — ABNORMAL LOW (ref 36.0–46.0)
Hemoglobin: 8.1 g/dL — ABNORMAL LOW (ref 12.0–15.0)
MCH: 30.2 pg (ref 26.0–34.0)
MCHC: 31 g/dL (ref 30.0–36.0)
MCV: 97.4 fL (ref 80.0–100.0)
Platelets: 378 10*3/uL (ref 150–400)
RBC: 2.68 MIL/uL — ABNORMAL LOW (ref 3.87–5.11)
RDW: 15.7 % — ABNORMAL HIGH (ref 11.5–15.5)
WBC: 8 10*3/uL (ref 4.0–10.5)
nRBC: 0 % (ref 0.0–0.2)

## 2022-02-18 LAB — ECHOCARDIOGRAM COMPLETE
AR max vel: 1.92 cm2
AV Area VTI: 1.75 cm2
AV Area mean vel: 1.82 cm2
AV Mean grad: 8.5 mmHg
AV Peak grad: 15.8 mmHg
Ao pk vel: 1.99 m/s
Area-P 1/2: 4.06 cm2
Calc EF: 67 %
Height: 66 in
S' Lateral: 2.7 cm
Single Plane A2C EF: 66 %
Single Plane A4C EF: 67 %
Weight: 1869.5 oz

## 2022-02-18 LAB — BASIC METABOLIC PANEL
Anion gap: 8 (ref 5–15)
BUN: 12 mg/dL (ref 8–23)
CO2: 30 mmol/L (ref 22–32)
Calcium: 8.3 mg/dL — ABNORMAL LOW (ref 8.9–10.3)
Chloride: 101 mmol/L (ref 98–111)
Creatinine, Ser: 0.72 mg/dL (ref 0.44–1.00)
GFR, Estimated: >60 mL/min (ref >60)
Glucose, Bld: 102 mg/dL — ABNORMAL HIGH (ref 70–99)
Potassium: 3.6 mmol/L (ref 3.5–5.1)
Sodium: 139 mmol/L (ref 135–145)

## 2022-02-18 LAB — CULTURE, BLOOD (ROUTINE X 2)
Culture: NO GROWTH
Culture: NO GROWTH

## 2022-02-18 MED ORDER — OXYCODONE-ACETAMINOPHEN 5-325 MG PO TABS: 1.0000 | ORAL_TABLET | Freq: Four times a day (QID) | ORAL | 0 refills | Status: DC | PRN

## 2022-02-18 MED ORDER — CEPHALEXIN 500 MG PO CAPS
500.0000 mg | ORAL_CAPSULE | Freq: Three times a day (TID) | ORAL | 0 refills | Status: AC
  Filled 2022-02-18: qty 15, 5d supply, fill #0

## 2022-02-18 MED ORDER — POTASSIUM CHLORIDE 20 MEQ PO PACK
20.0000 meq | PACK | Freq: Once | ORAL | Status: AC
Start: 2022-02-18 — End: 2022-02-18
  Administered 2022-02-18: 20 meq via ORAL
  Filled 2022-02-18: qty 1

## 2022-02-18 MED ORDER — POTASSIUM CHLORIDE CRYS ER 20 MEQ PO TBCR
20.0000 meq | EXTENDED_RELEASE_TABLET | Freq: Every day | ORAL | 0 refills | Status: AC
  Filled 2022-02-18: qty 10, 10d supply, fill #0

## 2022-02-18 MED ORDER — DOXYCYCLINE HYCLATE 100 MG PO TABS
100.0000 mg | ORAL_TABLET | Freq: Two times a day (BID) | ORAL | 0 refills | Status: AC
  Filled 2022-02-18: qty 10, 5d supply, fill #0

## 2022-02-18 MED ORDER — FUROSEMIDE 20 MG PO TABS
20.0000 mg | ORAL_TABLET | Freq: Every day | ORAL | 11 refills | Status: AC
  Filled 2022-02-18: qty 30, 30d supply, fill #0

## 2022-02-18 MED ORDER — FUROSEMIDE 10 MG/ML IJ SOLN
20.0000 mg | Freq: Once | INTRAMUSCULAR | Status: AC
  Administered 2022-02-18: 20 mg via INTRAVENOUS
  Filled 2022-02-18: qty 2

## 2022-02-18 NOTE — Discharge Summary (Signed)
Physician Discharge Summary   Patient: Tamara Johnson MRN: [id number] DOB: 1935/09/28  Admit date:     02/12/2022  Discharge date: 02/18/22  Discharge Physician: Elmarie Shiley   PCP: Bonnita Nasuti, MD   Recommendations at discharge:    Needs follow up for resolution of cellulitis.  Needs to follow up with Pulmonologist, per family patient had lung Nodule on CT scan done at [facility] and was advised to follow up with pulmonologist   Discharge Diagnoses: Principal Problem:   Cellulitis of right lower extremity Active Problems:   PAD (peripheral artery disease) ([place])   Hypothyroidism   Tobacco abuse   Dyslipidemia   Chronic obstructive pulmonary disease (COPD) ([place])  Resolved Problems:   * No resolved hospital problems. [facility] Course: 86 year old with past medical history significant for PAD/PVD status post stent right lower extremity, HLD, COPD on 2 L of oxygen at baseline, hypothyroidism, RLS who presented to [facility] on 7/8 with progressive edema and redness of her right lower extremity extending from her foot to the knee.     Patient admitted with right lower extremity cellulitis, x-ray of the right tibia-fibula with no acute osseous abnormality, considerable soft tissue edema with ulcer wounds medial and lateral aspect of the ankle.  CT tibia-fibula with diffuse lower extremity edema which may be infectious, no abscess or abnormal enhancement, no acute osseous abnormalities. Redness has improved. She will be discharge on doxy and keflex for 5 more days.    Assessment and Plan: 1-Right lower extremity cellulitis: -Patient presenting with progressive swelling and erythema of right lower extremity, extending from foot to knee. -Recently underwent stent placement of right lower extremity due to her underlying peripheral vascular disease. -She presented with a leukocytosis, trending down.  -Doppler negative for DVT -Treated  with IV ceftriaxone for 4 days while in the  hospital. She will be discharge on keflex for 5 days.  -Wound care consulted, continue with local care.  -leg elevation.  -Started on Doxy. Plan to discharge on 5 days.  Redness has improved on her leg, now redness persist foot.  Plan to transfer to rehab today.  See wound care recommendations;  1. Cleanse right LE wounds with saline, pat dry 2. Apply medihoney to the medial and lateral malleolar wounds 3. Cover with silicone foam 4. Silicone foam only to the right pretibial wound   2-Peripheral artery disease: Follow-up with vascular surgery outpatient.  Recent stent placed right lower extremity Continue with aspirin and Plavix   Hypothyroidism: Continue with Synthroid   COPD, chronic hypoxic respiratory failure Continue with DuoNeb, Dulera and Incruse Ellipta Continue with chronic 2 L oxygen supplementation   Hyperlipidemia: Continue with statins   Anxiety/depression: Continue with Wellbutrin   Restless leg syndrome:  continue with ropinirole B12 deficiency:  continue with B12 supplements Vitamin D deficiency: continue with supplement Weakness debility: Plan to for rehab   Anemia; monitor hb. No evidence of active bleeding.  Continue with ferrous sulfate.  Check occult blood.  Repeat hb tomorrow.    Per family she had abnormal CT chest  "some spot in the lung that will need to be evaluated". chest x ray, but patient will need to follow up with pulmonologist   Acute Pulmonary edema; on chest x ray. She had positive balance. She has received IV lasix two dose. She denies dyspnea. Plan to discharge on oral lasix. ECHO normal EF, no diastolic dysfunction.   suspect related to IV fluids given during this admission.  Estimated body mass index is 18.86 kg/m as calculated from the following:   Height as of this encounter: '5\' 6"'$  (1.676 m).   Weight as of this encounter: 53 kg.         Consultants: None Procedures performed: ECHO  Disposition: Skilled nursing  facility Diet recommendation:  Discharge Diet Orders (From admission, onward)     Start     Ordered   02/18/22 0000  Diet - low sodium heart healthy        02/18/22 1352           Cardiac diet DISCHARGE MEDICATION: Allergies as of 02/18/2022   No Known Allergies      Medication List     STOP taking these medications    predniSONE 20 MG tablet Commonly known as: DELTASONE       TAKE these medications    acetaminophen 500 MG tablet Commonly known as: TYLENOL Take 1,000 mg by mouth 2 (two) times daily.   albuterol 108 (90 Base) MCG/ACT inhaler Commonly known as: VENTOLIN HFA Inhale 2 puffs into the lungs every 4 (four) hours as needed for shortness of breath or wheezing.   Aspirin Low Dose 81 MG tablet Generic drug: aspirin EC TAKE 1 TABLET BY MOUTH EVERY DAY What changed: how much to take   atorvastatin 10 MG tablet Commonly known as: LIPITOR TAKE 1 TABLET(10 MG) BY MOUTH DAILY What changed: See the new instructions.   Breztri Aerosphere 160-9-4.8 MCG/ACT Aero Generic drug: Budeson-Glycopyrrol-Formoterol Inhale 2 puffs into the lungs in the morning and at bedtime.   buPROPion 150 MG 12 hr tablet Commonly known as: ZYBAN Take 300 mg by mouth daily at 12 noon.   cephALEXin 500 MG capsule Commonly known as: KEFLEX Take 1 capsule (500 mg total) by mouth 3 (three) times daily for 5 days.   cetirizine 10 MG tablet Commonly known as: ZYRTEC Take 10 mg by mouth daily.   clopidogrel 75 MG tablet Commonly known as: Plavix Take 1 tablet (75 mg total) by mouth daily.   doxycycline 100 MG tablet Commonly known as: VIBRA-TABS Take 1 tablet (100 mg total) by mouth every 12 (twelve) hours for 5 days.   FeroSul 325 (65 FE) MG tablet Generic drug: ferrous sulfate Take 325 mg by mouth daily.   furosemide 20 MG tablet Commonly known as: Lasix Take 1 tablet (20 mg total) by mouth daily.   ICaps Areds 2 Caps Take 1 tablet by mouth in the morning and at  bedtime.   ipratropium-albuterol 0.5-2.5 (3) MG/3ML Soln Commonly known as: DUONEB Take 3 mLs by nebulization every 6 (six) hours as needed.   levothyroxine 25 MCG tablet Commonly known as: SYNTHROID Take 25 mcg by mouth every morning.   loratadine 10 MG tablet Commonly known as: CLARITIN Take 10 mg by mouth daily.   oxyCODONE-acetaminophen 5-325 MG tablet Commonly known as: PERCOCET/ROXICET Take 1 tablet by mouth every 6 (six) hours as needed for severe pain. What changed: when to take this   potassium chloride SA 20 MEQ tablet Commonly known as: KLOR-CON M Take 1 tablet (20 mEq total) by mouth daily for 10 days.   rOPINIRole 0.5 MG tablet Commonly known as: REQUIP Take 0.5 mg by mouth at bedtime.   Santyl 250 UNIT/GM ointment Generic drug: collagenase Apply 1 Application topically 2 (two) times daily.   vitamin B-12 500 MCG tablet Commonly known as: CYANOCOBALAMIN Take 500 mcg by mouth daily.   Vitamin D (Ergocalciferol) 1.25 MG (50000 UNIT)  Caps capsule Commonly known as: DRISDOL Take 50,000 Units by mouth once a week.               Discharge Care Instructions  (From admission, onward)           Start     Ordered   02/18/22 0000  Discharge wound care:       Comments: See above   02/18/22 1352            Contact information for after-discharge care     Destination     [facility] .   Service: Skilled Nursing Contact information: [address] [place] [phone]                    Discharge Exam: Danley Danker Weights   02/13/22 0357  Weight: 53 kg   General; NAD Lung; CTA  Condition at discharge: stable  The results of significant diagnostics from this hospitalization (including imaging, microbiology, ancillary and laboratory) are listed below for reference.   Imaging Studies: ECHOCARDIOGRAM COMPLETE  Result Date: 02/18/2022    ECHOCARDIOGRAM REPORT   Patient  Name:   Tamara Johnson Date of Exam: 02/18/2022 Medical Rec #:  [id number]      Height:       66.0 in Accession #:    [id number]     Weight:       116.8 lb Date of Birth:  1936/05/14      BSA:          1.591 m Patient Age:    54 years       BP:           138/72 mmHg Patient Gender: F              HR:           83 bpm. Exam Location:  Inpatient Procedure: 2D Echo, Cardiac Doppler and Color Doppler Indications:     Dyspnea  History:         Patient has no prior history of Echocardiogram examinations.                  Risk Factors:Dyslipidemia and Current Smoker.  Sonographer:     Jyl Heinz Referring Phys:  [id number] Jerald Kief A Seriah Brotzman Diagnosing Phys: Adrian Prows MD IMPRESSIONS  1. Left ventricular ejection fraction, by estimation, is 65 to 70%. Left ventricular ejection fraction by PLAX is 72 %. The left ventricle has normal function. The left ventricle has no regional wall motion abnormalities. Left ventricular diastolic parameters were normal.  2. Right ventricular systolic function is normal. The right ventricular size is normal. There is mildly elevated pulmonary artery systolic pressure. The estimated right ventricular systolic pressure is 56.3 mmHg.  3. Left atrial size was mildly dilated.  4. The mitral valve is normal in structure. Mild mitral valve regurgitation.  5. The aortic valve is tricuspid. There is mild calcification of the aortic valve. There is moderate thickening of the aortic valve. Aortic valve regurgitation is not visualized. Aortic valve sclerosis/calcification is present, without any evidence of aortic stenosis. Aortic valve area, by VTI measures 1.75 cm. Aortic valve mean gradient measures 8.5 mmHg. Aortic valve Vmax measures 1.99 m/s.  6. The inferior vena cava is dilated in size with >50% respiratory variability, suggesting right atrial pressure of 8 mmHg. FINDINGS  Left Ventricle: Left ventricular ejection fraction, by estimation, is 65 to 70%. Left ventricular ejection fraction by PLAX is  72  %. The left ventricle has normal function. The left ventricle has no regional wall motion abnormalities. The left ventricular internal cavity size was normal in size. There is no left ventricular hypertrophy. Left ventricular diastolic parameters were normal. Right Ventricle: The right ventricular size is normal. No increase in right ventricular wall thickness. Right ventricular systolic function is normal. There is mildly elevated pulmonary artery systolic pressure. The tricuspid regurgitant velocity is 2.50  m/s, and with an assumed right atrial pressure of 8 mmHg, the estimated right ventricular systolic pressure is 63.8 mmHg. Left Atrium: Left atrial size was mildly dilated. Right Atrium: Right atrial size was normal in size. Pericardium: There is no evidence of pericardial effusion. Mitral Valve: The mitral valve is normal in structure. There is moderate thickening of the mitral valve leaflet(s). Mild mitral annular calcification. Mild mitral valve regurgitation. Tricuspid Valve: The tricuspid valve is normal in structure. Tricuspid valve regurgitation is mild . No evidence of tricuspid stenosis. Aortic Valve: The aortic valve is tricuspid. There is mild calcification of the aortic valve. There is moderate thickening of the aortic valve. Aortic valve regurgitation is not visualized. Aortic valve sclerosis/calcification is present, without any evidence of aortic stenosis. Aortic valve mean gradient measures 8.5 mmHg. Aortic valve peak gradient measures 15.8 mmHg. Aortic valve area, by VTI measures 1.75 cm. Pulmonic Valve: The pulmonic valve was normal in structure. Pulmonic valve regurgitation is trivial. No evidence of pulmonic stenosis. Aorta: The aortic root is normal in size and structure. Venous: The inferior vena cava is dilated in size with greater than 50% respiratory variability, suggesting right atrial pressure of 8 mmHg. IAS/Shunts: No atrial level shunt detected by color flow Doppler.  LEFT  VENTRICLE PLAX 2D LV EF:         Left            Diastology                ventricular     LV e' medial:    6.09 cm/s                ejection        LV E/e' medial:  17.7                fraction by     LV e' lateral:   7.83 cm/s                PLAX is 72      LV E/e' lateral: 13.8                %. LVIDd:         4.60 cm LVIDs:         2.70 cm LV PW:         0.90 cm LV IVS:        0.90 cm LVOT diam:     1.80 cm LV SV:         70 LV SV Index:   44 LVOT Area:     2.54 cm  LV Volumes (MOD) LV vol d, MOD    67.4 ml A2C: LV vol d, MOD    62.4 ml A4C: LV vol s, MOD    22.9 ml A2C: LV vol s, MOD    20.6 ml A4C: LV SV MOD A2C:   44.5 ml LV SV MOD A4C:   62.4 ml LV SV MOD BP:    44.1 ml RIGHT VENTRICLE  IVC RV Basal diam:  2.50 cm     IVC diam: 2.20 cm RV Mid diam:    2.30 cm RV S prime:     14.90 cm/s TAPSE (M-mode): 2.1 cm LEFT ATRIUM             Index        RIGHT ATRIUM           Index LA diam:        3.40 cm 2.14 cm/m   RA Area:     12.60 cm LA Vol (A2C):   61.3 ml 38.52 ml/m  RA Volume:   27.20 ml  17.09 ml/m LA Vol (A4C):   59.5 ml 37.39 ml/m LA Biplane Vol: 60.7 ml 38.14 ml/m  AORTIC VALVE AV Area (Vmax):    1.92 cm AV Area (Vmean):   1.82 cm AV Area (VTI):     1.75 cm AV Vmax:           199.00 cm/s AV Vmean:          138.500 cm/s AV VTI:            0.400 m AV Peak Grad:      15.8 mmHg AV Mean Grad:      8.5 mmHg LVOT Vmax:         150.00 cm/s LVOT Vmean:        98.900 cm/s LVOT VTI:          0.274 m LVOT/AV VTI ratio: 0.69  AORTA Ao Root diam: 2.90 cm Ao Asc diam:  2.70 cm MITRAL VALVE                TRICUSPID VALVE MV Area (PHT): 4.06 cm     TR Peak grad:   25.0 mmHg MV Decel Time: 187 msec     TR Vmax:        250.00 cm/s MV E velocity: 108.00 cm/s MV A velocity: 99.00 cm/s   SHUNTS MV E/A ratio:  1.09         Systemic VTI:  0.27 m                             Systemic Diam: 1.80 cm Adrian Prows MD Electronically signed by Adrian Prows MD Signature Date/Time: 02/18/2022/1:43:00 PM    Final    DG  Chest 2 View  Result Date: 02/17/2022 CLINICAL DATA:  COPD. EXAM: CHEST - 2 VIEW COMPARISON:  Chest radiograph dated 02/10/2022. FINDINGS: There is mild cardiomegaly with vascular congestion and edema. Small bilateral pleural effusions with bibasilar atelectasis or infiltrate. No pneumothorax. Atherosclerotic calcification of the aorta. No acute osseous pathology. IMPRESSION: Cardiomegaly with findings of CHF and small bilateral pleural effusions. Electronically Signed   By: Anner Crete M.D.   On: 02/17/2022 19:46   VAS [place] LOWER EXTREMITY VENOUS (DVT)  Result Date: 02/14/2022  Lower Venous DVT Study Patient Name:  ETIENNE MILLWARD  Date of Exam:   02/14/2022 Medical Rec #: [id number]       Accession #:    [id number] Date of Birth: 15-Apr-1936       Patient Gender: F Patient Age:   9 years Exam Location:  [facility] Procedure:      VAS [place] LOWER EXTREMITY VENOUS (DVT) Referring Phys: Harrell Gave SPONGBERG --------------------------------------------------------------------------------  Indications: Pain, Edema, and Erythema (cellulitis).  Limitations: Poor ultrasound/tissue interface. Comparison Study: No previous exams Performing Technologist: Jody Hill RVT, RDMS  Examination Guidelines: A complete evaluation includes B-mode imaging, spectral Doppler, color Doppler, and power Doppler as needed of all accessible portions of each vessel. Bilateral testing is considered an integral part of a complete examination. Limited examinations for reoccurring indications may be performed as noted. The reflux portion of the exam is performed with the patient in reverse Trendelenburg.  +---------+---------------+---------+-----------+--------------+--------------+ RIGHT    CompressibilityPhasicitySpontaneityProperties    Thrombus Aging +---------+---------------+---------+-----------+--------------+--------------+ CFV      Full           No       Yes        pulsatile flow                +---------+---------------+---------+-----------+--------------+--------------+ SFJ      Full                                                            +---------+---------------+---------+-----------+--------------+--------------+ FV Prox  Full           Yes      Yes                                     +---------+---------------+---------+-----------+--------------+--------------+ FV Mid   Full           Yes      Yes                                     +---------+---------------+---------+-----------+--------------+--------------+ FV DistalFull           Yes      Yes                                     +---------+---------------+---------+-----------+--------------+--------------+ PFV      Full                                                            +---------+---------------+---------+-----------+--------------+--------------+ POP      Full           No       Yes        pulsatile flow               +---------+---------------+---------+-----------+--------------+--------------+ PTV      Full                                                            +---------+---------------+---------+-----------+--------------+--------------+ PERO     Full                                                            +---------+---------------+---------+-----------+--------------+--------------+   +----+---------------+---------+-----------+--------------+--------------+  LEFTCompressibilityPhasicitySpontaneityProperties    Thrombus Aging +----+---------------+---------+-----------+--------------+--------------+ CFV Full           No       Yes        pulsatile flow               +----+---------------+---------+-----------+--------------+--------------+     Summary: RIGHT: - There is no evidence of deep vein thrombosis in the lower extremity.  - No cystic structure found in the popliteal fossa. - Subcutaneous edema extending from popliteal fossa to ankle. -  Ultrasound characteristics of enlarged lymph nodes are noted in the groin.  LEFT: - No evidence of common femoral vein obstruction.  *See table(s) above for measurements and observations. Electronically signed by Monica Martinez MD on 02/14/2022 at 1:49:43 PM.    Final    CT TIBIA FIBULA RIGHT W CONTRAST  Result Date: 02/13/2022 CLINICAL DATA:  Soft tissue infection suspected, lower leg. EXAM: CT OF THE LOWER RIGHT EXTREMITY WITH CONTRAST TECHNIQUE: Multidetector CT imaging of the lower right extremity was performed according to the standard protocol following intravenous contrast administration. RADIATION DOSE REDUCTION: This exam was performed according to the departmental dose-optimization program which includes automated exposure control, adjustment of the mA and/or kV according to patient size and/or use of iterative reconstruction technique. CONTRAST:  18m OMNIPAQUE IOHEXOL 300 MG/ML  SOLN COMPARISON:  02/12/2022. FINDINGS: Bones/Joint/Cartilage No acute fracture or dislocation. Mild-to-moderate degenerative changes are noted at the knee. Minimal calcaneal spurring is present. No periosteal elevation or bony erosion. Evaluation of the digits is limited due to osteopenia. Ligaments Suboptimally assessed by CT. Muscles and Tendons No definite intramuscular edema or abnormal enhancement. Soft tissues Diffuse subcutaneous edema is noted. The vasculature appears patent. No abscess or focal fluid collection. No abnormal enhancement. Multiple varicose veins are noted. IMPRESSION: 1. Diffuse lower extremity edema which may be infectious or inflammatory. No abscess or abnormal enhancement. 2. No acute osseous abnormality. Electronically Signed   By: LBrett FairyM.D.   On: 02/13/2022 00:34   DG Tibia/Fibula Right  Result Date: 02/12/2022 CLINICAL DATA:  Wound infection EXAM: RIGHT TIBIA AND FIBULA - 2 VIEW COMPARISON:  01/04/2022 ankle radiograph FINDINGS: No fracture or malalignment. Considerable soft tissue  edema. Small ulcers or wounds at the medial and lateral aspect of the ankle. No osseous destructive change. IMPRESSION: No acute osseous abnormality. Considerable soft tissue edema with ulcers or wounds at the medial and lateral aspect of the ankle Electronically Signed   By: KDonavan FoilM.D.   On: 02/12/2022 22:19   VAS [place] WITH/WO TBI  Result Date: 02/09/2022  LOWER EXTREMITY DOPPLER STUDY Patient Name:  KJAMYIAH LABELLA Date of Exam:   02/09/2022 Medical Rec #: [id number]      Accession #:    [id number] of Birth: [birth year]      Patient Gender: F Patient Age:   83years [facility] Procedure:      VAS [place] WITH/WO TBI Referring Phys: SAldona BarRHYNE --------------------------------------------------------------------------------  Indications: Ulceration, and peripheral artery disease. High Risk Factors: Current smoker.  Vascular Interventions: 11/30/2021: Right SFA stent. Comparison Study: 12/20/2021: Rt ABI 0.98; Lt ABI 0.68 Performing Technologist: MIvan Croft Examination Guidelines: A complete evaluation includes at minimum, Doppler waveform signals and systolic blood pressure reading at the level of bilateral brachial, anterior tibial, and posterior tibial arteries, when vessel segments are accessible. Bilateral testing is considered an integral part of a complete examination. Photoelectric Plethysmograph (PPG)  waveforms and toe systolic pressure readings are included as required and additional duplex testing as needed. Limited examinations for reoccurring indications may be performed as noted.  ABI Findings: +---------+------------------+-----+--------+--------+ Right    Rt Pressure (mmHg)IndexWaveformComment  +---------+------------------+-----+--------+--------+ Brachial 151                                     +---------+------------------+-----+--------+--------+ PTA      149               0.99 biphasic          +---------+------------------+-----+--------+--------+ DP       150               0.99 biphasic         +---------+------------------+-----+--------+--------+ Great Toe141               0.93                  +---------+------------------+-----+--------+--------+ +---------+------------------+-----+----------+-------+ Left     Lt Pressure (mmHg)IndexWaveform  Comment +---------+------------------+-----+----------+-------+ Brachial 146                                      +---------+------------------+-----+----------+-------+ PTA      93                0.62 monophasic        +---------+------------------+-----+----------+-------+ DP       92                0.61 monophasic        +---------+------------------+-----+----------+-------+ Great Toe51                0.34                   +---------+------------------+-----+----------+-------+ +-------+-----------+-----------+------------+------------+ ABI/TBIToday's ABIToday's TBIPrevious ABIPrevious TBI +-------+-----------+-----------+------------+------------+ Right  0.99       0.93       0.98        0.68         +-------+-----------+-----------+------------+------------+ Left   0.62       0.34       0.68        0.40         +-------+-----------+-----------+------------+------------+   Summary: Right: Resting right ankle-brachial index is within normal range. No evidence of significant right lower extremity arterial disease. The right toe-brachial index is normal. Left: Resting left ankle-brachial index indicates moderate left lower extremity arterial disease. The left toe-brachial index is abnormal. *See table(s) above for measurements and observations.  Electronically signed by Servando Snare MD on 02/09/2022 at 1:21:26 PM.    Final     Microbiology: Results for orders placed or performed during the hospital encounter of 02/12/22  Blood culture (routine x 2)     Status: None   Collection Time: 02/12/22   9:57 PM   Specimen: BLOOD  Result Value Ref Range Status   Specimen Description   Final    BLOOD LEFT ANTECUBITAL Performed at [facility], [place] [address] [place], [place] [address]    Special Requests   Final    BOTTLES DRAWN AEROBIC AND ANAEROBIC Blood Culture results may not be optimal due to an inadequate volume of blood received in culture bottles Performed at [place] [address], [place], [place] [address]  Culture   Final    NO GROWTH 5 DAYS Performed at [facility], [place] [address], [place], [place] [address]    Report Status 02/18/2022 FINAL  Final  Blood culture (routine x 2)     Status: None   Collection Time: 02/12/22 10:54 PM   Specimen: BLOOD  Result Value Ref Range Status   Specimen Description   Final    BLOOD BLOOD LEFT FOREARM Performed at [place] [address], [place], [place] [address]    Special Requests   Final    BOTTLES DRAWN AEROBIC AND ANAEROBIC Blood Culture results may not be optimal due to an excessive volume of blood received in culture bottles Performed at [facility] [address], [place], [place] [address]    Culture   Final    NO GROWTH 5 DAYS Performed at [facility], [place] [address] [place], [place] [address]    Report Status 02/18/2022 FINAL  Final  MRSA Next Gen by PCR, Nasal     Status: Abnormal   Collection Time: 02/16/22 12:27 PM   Specimen: Nasal Mucosa; Nasal Swab  Result Value Ref Range Status   MRSA by PCR Next Gen DETECTED (A) NOT DETECTED Final    Comment: (NOTE) The GeneXpert MRSA Assay (FDA approved for NASAL specimens only), is one component of a comprehensive MRSA colonization surveillance program. It is not intended to diagnose MRSA infection nor to guide or monitor treatment for MRSA infections. Test performance is not FDA approved in patients less than 52 years old. Performed at [facility], [place] [address], [place], [place] [address]     Labs: CBC: Recent Labs  Lab 02/12/22 2156 02/13/22 0300 02/14/22 0443 02/15/22 0445 02/16/22 0515 02/17/22 0700 02/18/22 0656  WBC 16.9*   < > 13.6* 9.2 8.7 7.9 8.0  NEUTROABS 14.8*  --  10.8* 6.9  --   --   --   HGB 9.9*   < > 8.8* 8.1* 7.8* 7.7* 8.1*  HCT 31.8*   < > 29.1* 26.6* 25.1* 25.1* 26.1*  MCV 98.5   < > 98.3 97.8 97.3 99.2 97.4  PLT 247   < > 246 242 266 321 378   < > = values in this interval not displayed.   Basic Metabolic Panel: Recent Labs  Lab 02/14/22 0443 02/15/22 0445 02/16/22 0515 02/17/22 0428 02/18/22 0656  NA 138 140 136 138 139  K 3.9 3.8 3.5 3.8 3.6  CL 106 108 104 105 101  CO2 '23 25 24 25 30  '$ GLUCOSE 89 101* 103* 110* 102*  BUN '22 16 11 13 12  '$ CREATININE 0.70 0.68 0.61 0.64 0.72  CALCIUM 7.8* 7.6* 7.7* 7.8* 8.3*   Liver Function Tests: Recent Labs  Lab 02/12/22 2156  AST 27  ALT 25  ALKPHOS 105  BILITOT 0.8  PROT 6.9  ALBUMIN 3.0*   CBG: No results for input(s): "GLUCAP" in the last 168 hours.  Discharge time spent: greater than 30 minutes.  Signed: Elmarie Shiley, MD [facility] 02/18/2022

## 2022-02-18 NOTE — Care Management Important Message (Signed)
Important Message  Patient Details  Name: Tamara Johnson MRN: [id number] Date of Birth: March 31, 1936   Medicare Important Message Given:  Yes     Memory Argue 02/18/2022, 4:16 PM

## 2022-02-18 NOTE — TOC Transition Note (Signed)
Transition of Care [facility]) - CM/SW Discharge Note   Patient Details  Name: Tamara Johnson MRN: [id number] Date of Birth: 10-12-1935  Transition of Care [facility]) CM/SW Contact:  Lennart Pall, LCSW Phone Number: 02/18/2022, 2:41 PM   Clinical Narrative:    Pt is medically cleared for SNF dc today and bed accepted at [facility].  Pt/son aware and agreeable with dc today.  PTAR called at 2:25pm.  RN to call report to [phone].  No further TOC needs.   Final next level of care: Skilled Nursing Facility Barriers to Discharge: Barriers Resolved   Patient Goals and CMS Choice Patient states their goals for this hospitalization and ongoing recovery are:: To go to SNF for short term rehab, then return back home. CMS Medicare.gov Compare Post Acute Care list provided to:: Patient Represenative (must comment) Choice offered to / list presented to : Adult Children  Discharge Placement   Existing PASRR number confirmed : 02/15/22          Patient chooses bed at: [facility] Patient to be transferred to facility by: [facility] Name of family member notified: son, Edd Arbour Patient and family notified of of transfer: 02/18/22  Discharge Plan and Services In-house Referral: Clinical Social Work   Post Acute Care Choice: [facility]          DME Arranged: N/A DME Agency: NA                  Social Determinants of Health ([place]) Interventions     Readmission Risk Interventions     No data to display

## 2022-03-16 ENCOUNTER — Telehealth: Payer: Self-pay

## 2022-03-16 NOTE — Telephone Encounter (Signed)
Amy Post, RN with [facility] called stating that she saw the pt on 8/8 for the first time after d/c home from rehab. She feels that the Santyl and gauze dressing is no longer appropriate given that there is no weeping, no slough, and no eschar on the wounds. There are only 2 raw areas. She was requesting order changes to an unna boot to be changed weekly for 2 weeks min. She wants to also transition to JextaLight compression stockings once she's done with the unna boot.   Reviewed pt's chart, returned call for clarification, two identifiers used. She stated that the JextaLight compressions were medical grade velcro closing stockings that are easier for pts to put on.  Spoke with Sam PA who advised to try the unna boot. Did not discuss the JextaLight compressions at this time. Called Amy, no answer, lf vm to give verbal order for the unna boot change.

## 2022-03-22 ENCOUNTER — Encounter (HOSPITAL_COMMUNITY): Payer: Medicare Other

## 2022-03-24 ENCOUNTER — Encounter (HOSPITAL_COMMUNITY): Payer: Medicare Other

## 2022-03-24 ENCOUNTER — Ambulatory Visit: Payer: Medicare Other | Admitting: Vascular Surgery

## 2022-05-04 ENCOUNTER — Ambulatory Visit (HOSPITAL_COMMUNITY)
Admission: RE | Admit: 2022-05-04 | Discharge: 2022-05-04 | Disposition: A | Discharge status: Home / Self Care | Payer: Medicare Other | Source: Ambulatory Visit | Attending: Physician Assistant | Admitting: Physician Assistant

## 2022-05-04 ENCOUNTER — Ambulatory Visit (INDEPENDENT_AMBULATORY_CARE_PROVIDER_SITE_OTHER): Payer: Medicare Other | Admitting: Physician Assistant

## 2022-05-04 VITALS — BP 153/71 | HR 71 | Temp 97.6°F | Ht 66.0 in | Wt 114.0 lb

## 2022-05-04 DIAGNOSIS — I739 Peripheral vascular disease, unspecified: Secondary | ICD-10-CM

## 2022-05-04 DIAGNOSIS — L97321 Non-pressure chronic ulcer of left ankle limited to breakdown of skin: Secondary | ICD-10-CM

## 2022-05-04 DIAGNOSIS — I6523 Occlusion and stenosis of bilateral carotid arteries: Secondary | ICD-10-CM | POA: Diagnosis not present

## 2022-05-04 DIAGNOSIS — I872 Venous insufficiency (chronic) (peripheral): Secondary | ICD-10-CM

## 2022-05-04 DIAGNOSIS — L97909 Non-pressure chronic ulcer of unspecified part of unspecified lower leg with unspecified severity: Secondary | ICD-10-CM | POA: Diagnosis present

## 2022-05-04 DIAGNOSIS — I70299 Other atherosclerosis of native arteries of extremities, unspecified extremity: Secondary | ICD-10-CM

## 2022-05-04 NOTE — Progress Notes (Signed)
[facility] HISTORY AND PHYSICAL   History of Present Illness:  Patient is a 86 y.o. year old female who presents for evaluation of right LE edema with malleolus wounds.  She has a history of GSV ablation/stab phlebectomy.  She has right chronic cellulitis on/off and has wounds on/off.  The medial and lateral malleolus wounds are healed and now she has a new medial wounds.  She denies short distance claudication, no foot wounds and no rest pain.    She denies fever and chills.  She has been using her compression and elevation  daily.  She is medically managed on ASA, Plavix and Statin daily.  She has just been diagnosed with a foot non displaced fracture and is in a post op shoe.  Past Medical History:  Diagnosis Date   Cellulitis     Past Surgical History:  Procedure Laterality Date   ABDOMINAL AORTOGRAM W/LOWER EXTREMITY Bilateral 10/04/2019   Procedure: ABDOMINAL AORTOGRAM W/LOWER EXTREMITY;  Surgeon: Angelia Mould, MD;  Location: [facility];  Service: Cardiovascular;  Laterality: Bilateral;   ABDOMINAL AORTOGRAM W/LOWER EXTREMITY N/A 11/30/2021   Procedure: ABDOMINAL AORTOGRAM W/LOWER EXTREMITY;  Surgeon: Serafina Mitchell, MD;  Location: [facility];  Service: Cardiovascular;  Laterality: N/A;   PERIPHERAL VASCULAR INTERVENTION  11/30/2021   Procedure: PERIPHERAL VASCULAR INTERVENTION;  Surgeon: Serafina Mitchell, MD;  Location: [facility];  Service: Cardiovascular;;   PR VEIN BYPASS GRAFT,AORTO-FEM-POP      ROS:   General:  No weight loss, Fever, chills  HEENT: No recent headaches, no nasal bleeding, no visual changes, no sore throat  Neurologic: No dizziness, blackouts, seizures. No recent symptoms of stroke or mini- stroke. No recent episodes of slurred speech, or temporary blindness.  Cardiac: No recent episodes of chest pain/pressure, no shortness of breath at rest.  No shortness of breath with exertion.  Denies history  of atrial fibrillation or irregular heartbeat  Vascular: No history of rest pain in feet.  No history of claudication.  positive history of non-healing ulcer, No history of DVT   Pulmonary: No home oxygen, no productive cough, no hemoptysis,  No asthma or wheezing  Musculoskeletal:  '[ ]'$  Arthritis, '[ ]'$  Low back pain,  '[ ]'$  Joint pain  Hematologic:No history of hypercoagulable state.  No history of easy bleeding.  No history of anemia  Gastrointestinal: No hematochezia or melena,  No gastroesophageal reflux, no trouble swallowing  Urinary: '[ ]'$  chronic Kidney disease, '[ ]'$  on HD - '[ ]'$  MWF or '[ ]'$  TTHS, '[ ]'$  Burning with urination, '[ ]'$  Frequent urination, '[ ]'$  Difficulty urinating;   Skin: No rashes  Psychological: No history of anxiety,  No history of depression  Social History Social History   Tobacco Use   Smoking status: Every Day    Packs/day: 1.00    Types: Cigarettes    Passive exposure: Never   Smokeless tobacco: Never  Vaping Use   Vaping Use: Never used  Substance Use Topics   Alcohol use: No    Alcohol/week: 0.0 standard drinks of alcohol   Drug use: No    Family History Family History  Problem Relation Age of Onset   Heart attack Father     Allergies  No Known Allergies   Current Outpatient Medications  Medication Sig Dispense Refill   acetaminophen (TYLENOL) 500 MG tablet Take 1,000 mg by mouth 2 (two) times daily.     albuterol (VENTOLIN HFA)  108 (90 Base) MCG/ACT inhaler Inhale 2 puffs into the lungs every 4 (four) hours as needed for shortness of breath or wheezing.     ASPIRIN LOW DOSE 81 MG EC tablet TAKE 1 TABLET BY MOUTH EVERY DAY (Patient taking differently: Take 81 mg by mouth daily.) 150 tablet 2   atorvastatin (LIPITOR) 10 MG tablet TAKE 1 TABLET(10 MG) BY MOUTH DAILY (Patient taking differently: Take 10 mg by mouth daily.) 30 tablet 11   Budeson-Glycopyrrol-Formoterol (BREZTRI AEROSPHERE) 160-9-4.8 MCG/ACT AERO Inhale 2 puffs into the lungs in the  morning and at bedtime.     buPROPion (ZYBAN) 150 MG 12 hr tablet Take 300 mg by mouth daily at 12 noon.     cetirizine (ZYRTEC) 10 MG tablet Take 10 mg by mouth daily.     clopidogrel (PLAVIX) 75 MG tablet Take 1 tablet (75 mg total) by mouth daily. 30 tablet 11   FEROSUL 325 (65 Fe) MG tablet Take 325 mg by mouth daily.     furosemide (LASIX) 20 MG tablet Take 1 tablet (20 mg total) by mouth daily. 30 tablet 11   ipratropium-albuterol (DUONEB) 0.5-2.5 (3) MG/3ML SOLN Take 3 mLs by nebulization every 6 (six) hours as needed.     levothyroxine (SYNTHROID) 25 MCG tablet Take 25 mcg by mouth every morning.     loratadine (CLARITIN) 10 MG tablet Take 10 mg by mouth daily.     Multiple Vitamins-Minerals (ICAPS AREDS 2) CAPS Take 1 tablet by mouth in the morning and at bedtime.      oxyCODONE-acetaminophen (PERCOCET/ROXICET) 5-325 MG tablet Take 1 tablet by mouth every 6 (six) hours as needed for severe pain. 20 tablet 0   rOPINIRole (REQUIP) 0.5 MG tablet Take 0.5 mg by mouth at bedtime.     SANTYL 250 UNIT/GM ointment Apply 1 Application topically 2 (two) times daily.     vitamin B-12 (CYANOCOBALAMIN) 500 MCG tablet Take 500 mcg by mouth daily.     Vitamin D, Ergocalciferol, (DRISDOL) 1.25 MG (50000 UNIT) CAPS capsule Take 50,000 Units by mouth once a week.     potassium chloride SA (KLOR-CON M) 20 MEQ tablet Take 1 tablet (20 mEq total) by mouth daily for 10 days. 10 tablet 0   No current facility-administered medications for this visit.    Physical Examination  Vitals:   05/04/22 1426  BP: (!) 153/71  Pulse: 71  Temp: 97.6 F (36.4 C)  TempSrc: Temporal  SpO2: 98%  Weight: 114 lb (51.7 kg)  Height: '5\' 6"'$  (1.676 m)    Body mass index is 18.4 kg/m.  General:  Alert and oriented, no acute distress HEENT: Normal Neck: No bruit or JVD Pulmonary: Clear to auscultation bilaterally Cardiac: Regular Rate and Rhythm without murmur Abdomen: Soft, non-tender, non-distended, no mass, no  scars  Musculoskeletal: positive right LE edema      Neurologic: Upper and lower extremity motor 5/5 and symmetric  DATA:    Venous Reflux Times  +--------------+--------+------+----------+------------+-------------------  ----+  RIGHT         Reflux  Reflux  Reflux  Diameter cmsComments                                No       Yes     Time                                         +--------------+--------+------+----------+------------+-------------------  ----+  CFV                    yes  >1 second                                       +--------------+--------+------+----------+------------+-------------------  ----+  Popliteal              yes  >1 second                                       +--------------+--------+------+----------+------------+-------------------  ----+  GSV at [facility]    no                          0.76                              +--------------+--------+------+----------+------------+-------------------  ----+  GSV prox thigh         yes                0.18    prior                                                                        ablation/stripping        +--------------+--------+------+----------+------------+-------------------  ----+  GSV mid thigh                                     prior                                                                        ablation/stripping        +--------------+--------+------+----------+------------+-------------------  ----+  GSV dist thigh                                    prior                                                                        ablation/stripping        +--------------+--------+------+----------+------------+-------------------  ----+  GSV at knee                                       prior  ablation/stripping         +--------------+--------+------+----------+------------+-------------------  ----+  GSV prox calf                                     prior                                                                        ablation/stripping        +--------------+--------+------+----------+------------+-------------------  ----+  SSV Pop Fossa          yes   >500 ms      0.37                              +--------------+--------+------+----------+------------+-------------------  ----+  SSV prox calf          yes   >500 ms      0.47                              +--------------+--------+------+----------+------------+-------------------  ----+           Summary:  Right:  - No evidence of deep vein thrombosis seen in the right lower extremity,  from the common femoral through the popliteal veins.  - No evidence of superficial venous thrombosis in the right lower  extremity.     -     - The greater saphenous vein was not visualized due to stripping for  bypass surgery. - Per patient.   ASSESSMENT/PLAN:   Mixed PAD with venous insufficieny with Multiple small superficial wounds with surrounding erythema of the right lateral ankle.  She has history of previous laser ablation/stab phlebectomy on the right LE.  The venous duplex shows no DVT, but she has chronic venous reflux in the deep system.   Continue conservative management with compression and elevation.  A referal to the wound center has been made.  History of  right SFA occlusion S/P right SFA stent by Dr. Trula Slade on 11/30/21 Her previous wounds have healed and she will continue current conservative treatment to heal the new medial malleolus wound.     F/U in 6 months for repeat right arterial duplex with ABI's.        Roxy Horseman PA-C [facility] Office: [phone]  MD in clinic [place]

## 2022-05-06 ENCOUNTER — Other Ambulatory Visit: Payer: Self-pay

## 2022-05-06 DIAGNOSIS — I739 Peripheral vascular disease, unspecified: Secondary | ICD-10-CM

## 2022-05-11 ENCOUNTER — Encounter (HOSPITAL_COMMUNITY): Payer: Medicare Other

## 2022-05-11 ENCOUNTER — Ambulatory Visit: Payer: Medicare Other | Admitting: Vascular Surgery

## 2022-12-07 ENCOUNTER — Other Ambulatory Visit: Payer: Self-pay | Admitting: Surgery

## 2023-08-01 IMAGING — DX DG ANKLE COMPLETE 3+V*R*
3 series · 3 of 3 positions shown · non-contrast
Comparison: None Available.

CLINICAL DATA: Right ankle ulcers.  Cellulitis

EXAM:
RIGHT ANKLE - COMPLETE 3+ VIEW

[ankle ap]
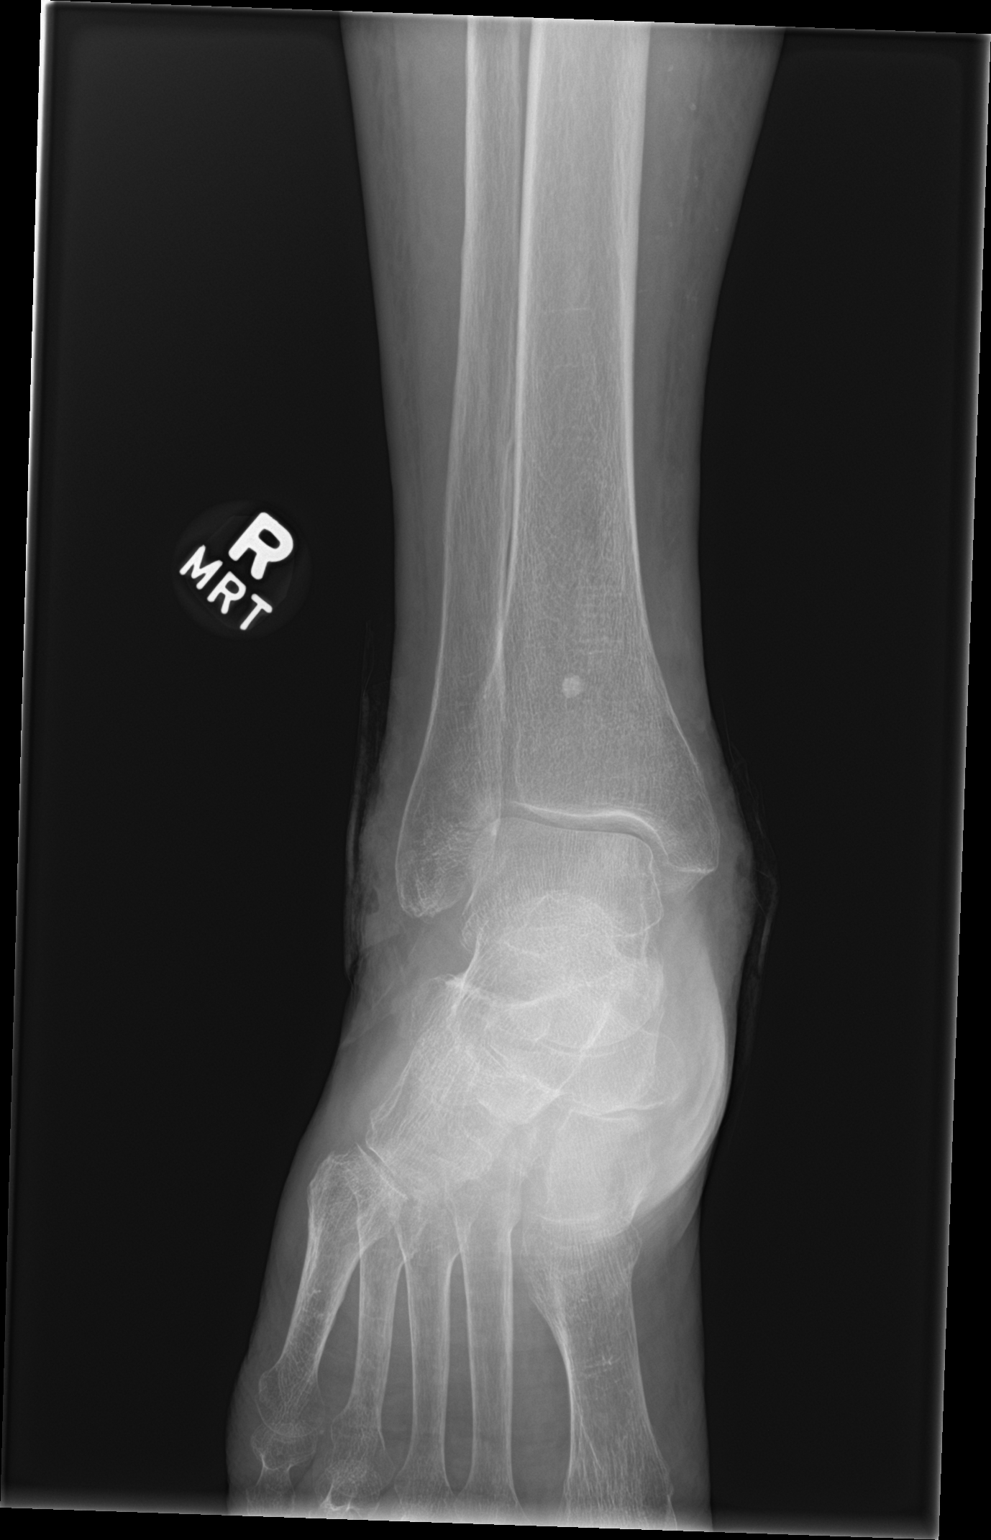

[ankle obl]
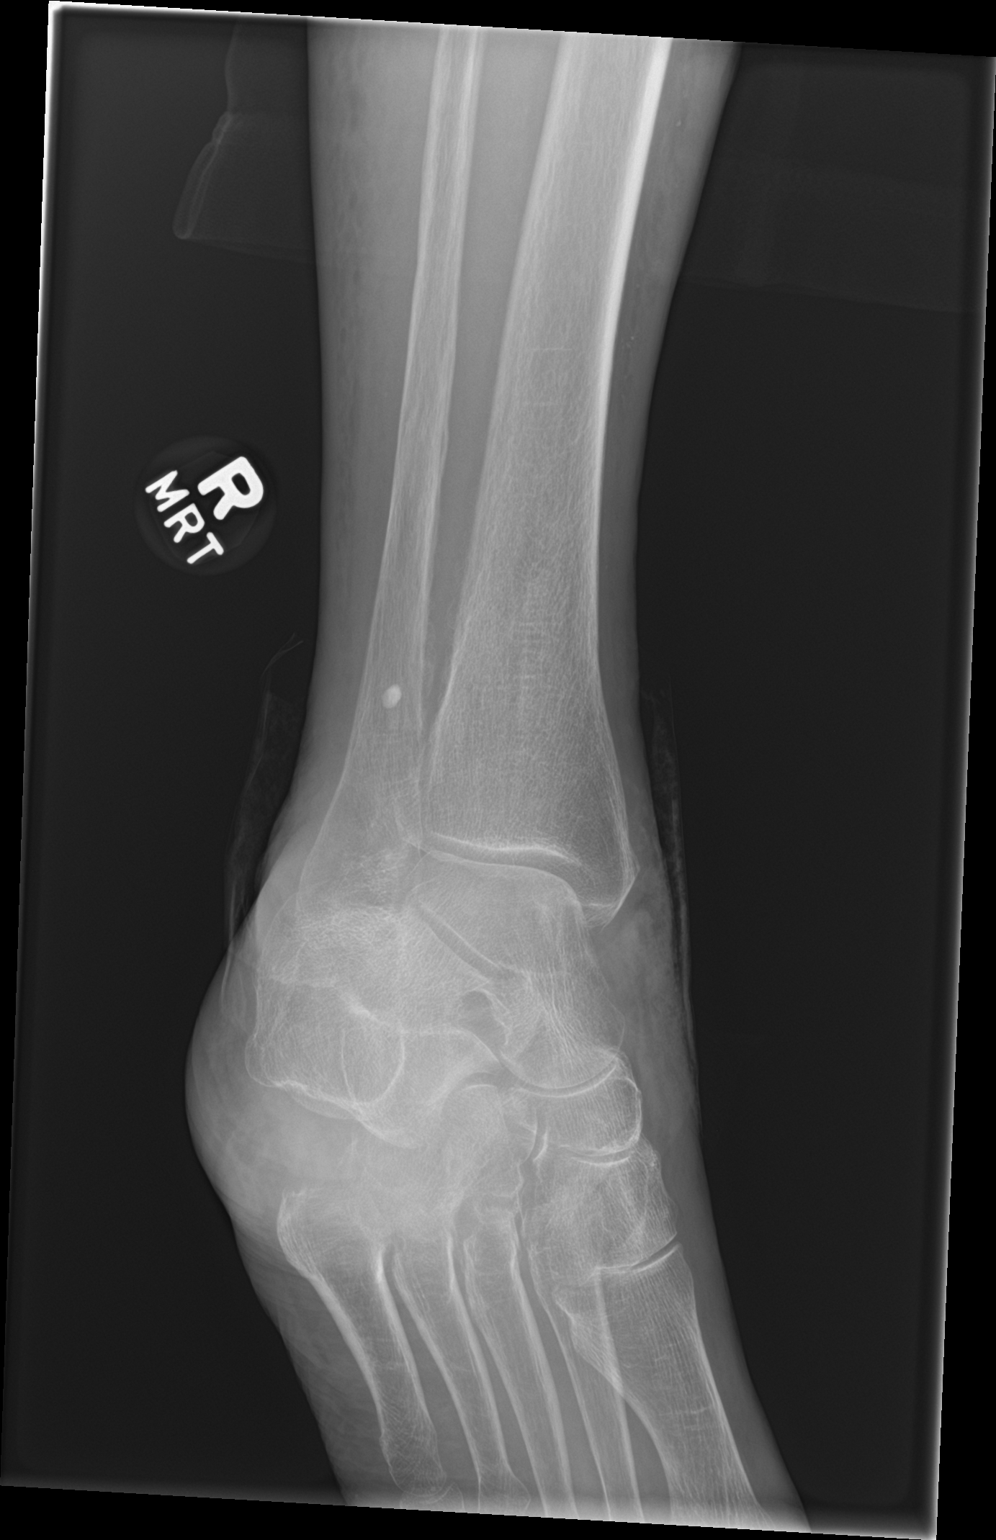

[ankle lat]
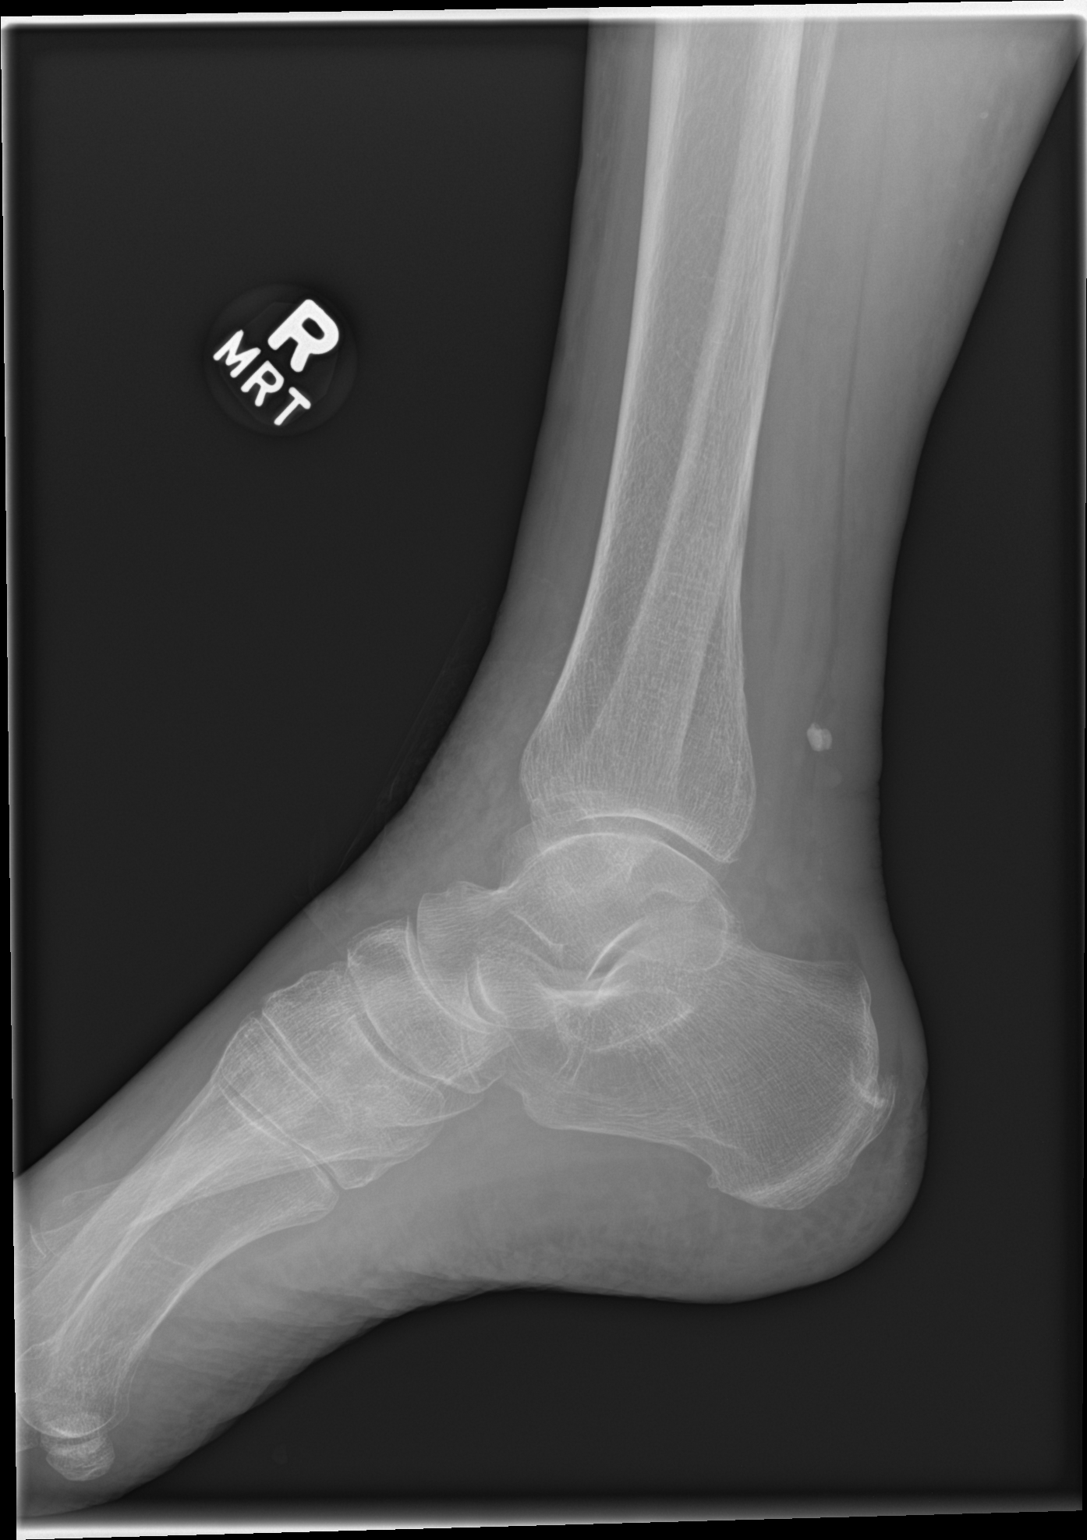

[3 of 3 positions shown; findings below may reference images not displayed]

FINDINGS: Diffuse osseous demineralization. Soft tissue wounds or ulcerations
overlies the medial and lateral malleoli. No evidence of underlying
bone erosion or periosteal elevation. No acute fracture or
dislocation.
IMPRESSION: 1. Soft tissue wounds or ulcerations overlies the medial and lateral
malleoli. No radiographic evidence of underlying acute
osteomyelitis.
2. Diffuse osseous demineralization.

## 2023-12-29 ENCOUNTER — Other Ambulatory Visit: Payer: Self-pay | Admitting: Vascular Surgery

## 2024-01-29 ENCOUNTER — Emergency Department (HOSPITAL_COMMUNITY)
Admission: EM | Admit: 2024-01-29 | Discharge: 2024-01-30 | Disposition: A | Discharge status: Home / Self Care | Attending: Emergency Medicine | Admitting: Emergency Medicine

## 2024-01-29 ENCOUNTER — Encounter (HOSPITAL_COMMUNITY): Payer: Self-pay | Admitting: *Deleted

## 2024-01-29 ENCOUNTER — Other Ambulatory Visit: Payer: Self-pay

## 2024-01-29 DIAGNOSIS — L89529 Pressure ulcer of left ankle, unspecified stage: Secondary | ICD-10-CM | POA: Insufficient documentation

## 2024-01-29 DIAGNOSIS — Z5189 Encounter for other specified aftercare: Secondary | ICD-10-CM | POA: Insufficient documentation

## 2024-01-29 DIAGNOSIS — L03116 Cellulitis of left lower limb: Secondary | ICD-10-CM | POA: Diagnosis not present

## 2024-01-29 LAB — BASIC METABOLIC PANEL WITH GFR
Anion gap: 11 (ref 5–15)
BUN: 19 mg/dL (ref 8–23)
CO2: 28 mmol/L (ref 22–32)
Calcium: 8.8 mg/dL — ABNORMAL LOW (ref 8.9–10.3)
Chloride: 103 mmol/L (ref 98–111)
Creatinine, Ser: 0.86 mg/dL (ref 0.44–1.00)
GFR, Estimated: >60 mL/min (ref >60)
Glucose, Bld: 110 mg/dL — ABNORMAL HIGH (ref 70–99)
Potassium: 4.6 mmol/L (ref 3.5–5.1)
Sodium: 142 mmol/L (ref 135–145)

## 2024-01-29 LAB — CBC WITH DIFFERENTIAL/PLATELET
Abs Immature Granulocytes: 0.02 10*3/uL (ref 0.00–0.07)
Basophils Absolute: 0 10*3/uL (ref 0.0–0.1)
Basophils Relative: 0 %
Eosinophils Absolute: 0 10*3/uL (ref 0.0–0.5)
Eosinophils Relative: 0 %
HCT: 34.6 % — ABNORMAL LOW (ref 36.0–46.0)
Hemoglobin: 10.7 g/dL — ABNORMAL LOW (ref 12.0–15.0)
Immature Granulocytes: 0 %
Lymphocytes Relative: 11 %
Lymphs Abs: 1 10*3/uL (ref 0.7–4.0)
MCH: 30.7 pg (ref 26.0–34.0)
MCHC: 30.9 g/dL (ref 30.0–36.0)
MCV: 99.4 fL (ref 80.0–100.0)
Monocytes Absolute: 0.8 10*3/uL (ref 0.1–1.0)
Monocytes Relative: 8 %
Neutro Abs: 7.4 10*3/uL (ref 1.7–7.7)
Neutrophils Relative %: 81 %
Platelets: 322 10*3/uL (ref 150–400)
RBC: 3.48 MIL/uL — ABNORMAL LOW (ref 3.87–5.11)
RDW: 14.6 % (ref 11.5–15.5)
WBC: 9.2 10*3/uL (ref 4.0–10.5)
nRBC: 0 % (ref 0.0–0.2)

## 2024-01-29 LAB — I-STAT CG4 LACTIC ACID, ED: Lactic Acid, Venous: 2.2 mmol/L (ref 0.5–1.9)

## 2024-01-29 MED ORDER — HYDROCODONE-ACETAMINOPHEN 5-325 MG PO TABS
2.0000 | ORAL_TABLET | Freq: Once | ORAL | Status: AC
  Administered 2024-01-29: 2 via ORAL
  Filled 2024-01-29: qty 2

## 2024-01-29 NOTE — ED Provider Triage Note (Signed)
 Emergency Medicine Provider Triage Evaluation Note  Tamara Johnson , a 88 y.o. female  was evaluated in triage.  Pt complains of wound on the medial aspect of the left ankle that has been present for the last week however has progressively worsened since Saturday this past weekend.  Increased swelling, redness, increased pain to the point where she was having difficulty sleeping last night.  Noted to have previous issues of cellulitis but in the right lower extremity.  Has had all of their chronic ulcers of the legs as well.  Has had venous bypass graft previously.  Previous medical history also includes peripheral artery disease, dyslipidemia, COPD.  Review of Systems  Positive: Wound to the right ankle, swelling and redness the same. Negative:   Physical Exam  BP (!) 143/68 (BP Location: Right Arm)   Pulse 79   Temp 99 F (37.2 C)   Resp 17   SpO2 94%  Gen:   Awake, no distress   Resp:  Normal effort  MSK:   Moves extremities without difficulty  Other:  Left ankle has a approximately 1 to 2 cm ulceration to the medial malleolus.  Surrounding periwound erythema extending up to the mid calf is noted.  Medical Decision Making  Medically screening exam initiated at 6:32 PM.  Appropriate orders placed.  DEMIAH GULLICKSON was informed that the remainder of the evaluation will be completed by another provider, this initial triage assessment does not replace that evaluation, and the importance of remaining in the ED until their evaluation is complete.  Suspect cellulitis, blood cultures and initial lab review has been obtained.   Myriam Dorn [place], [place] 01/29/24 [phone]

## 2024-01-29 NOTE — ED Triage Notes (Addendum)
 Pt is here with left medial ankle wound that has been having minimal drainage. She sees Dr. Sheree with vascular. Foot pink and warm, hard to feel pulses and awaiting doppler. Was able to doppler pulse in left foot

## 2024-01-30 ENCOUNTER — Emergency Department (HOSPITAL_COMMUNITY)

## 2024-01-30 LAB — I-STAT CG4 LACTIC ACID, ED: Lactic Acid, Venous: 0.9 mmol/L (ref 0.5–1.9)

## 2024-01-30 MED ORDER — CEPHALEXIN 500 MG PO CAPS: 500.0000 mg | ORAL_CAPSULE | Freq: Two times a day (BID) | ORAL | 0 refills | Status: AC

## 2024-01-30 MED ORDER — OXYCODONE-ACETAMINOPHEN 5-325 MG PO TABS: 1.0000 | ORAL_TABLET | Freq: Four times a day (QID) | ORAL | 0 refills | Status: AC | PRN

## 2024-01-30 MED ORDER — HYDROCODONE-ACETAMINOPHEN 5-325 MG PO TABS
2.0000 | ORAL_TABLET | Freq: Once | ORAL | Status: AC
  Administered 2024-01-30: 2 via ORAL
  Filled 2024-01-30: qty 2

## 2024-01-30 MED ORDER — DOXYCYCLINE HYCLATE 100 MG PO TABS
100.0000 mg | ORAL_TABLET | Freq: Once | ORAL | Status: AC
  Administered 2024-01-30: 100 mg via ORAL
  Filled 2024-01-30: qty 1

## 2024-01-30 MED ORDER — DOXYCYCLINE HYCLATE 100 MG PO CAPS
100.0000 mg | ORAL_CAPSULE | Freq: Two times a day (BID) | ORAL | 0 refills | Status: AC
Start: 2024-01-30 — End: ?

## 2024-01-30 NOTE — ED Provider Notes (Signed)
 [facility] Emergency Department Provider Note MRN:  [id number]  Arrival date & time: 01/30/24     Chief Complaint   Wound Check (Left medial ankle wound - been there for a couple of weeks)   History of Present Illness   Tamara Johnson is a 88 y.o. year-old female presents to the ED with chief complaint of left foot wound.  She noticed the wound a couple of days ago.  She states that she has had increased pain.  She isn't sure how it happened.  Denies fevers.  .  History provided by patient.   Review of Systems  Pertinent positive and negative review of systems noted in HPI.    Physical Exam   Vitals:   01/29/24 1817 01/29/24 2234  BP: (!) 143/68 (!) 151/63  Pulse: 79 85  Resp: 17 14  Temp: 99 F (37.2 C) 98.5 F (36.9 C)  SpO2: 94% 95%    CONSTITUTIONAL:  non toxic-appearing, NAD NEURO:  Alert and oriented x 3, CN 3-12 grossly intact EYES:  eyes equal and reactive ENT/NECK:  Supple, no stridor  CARDIO:  normal rate, regular rhythm, distal pulses are dopplerable, but no palpable. PULM:  No respiratory distress, CTAB GI/GU:  non-distended,  MSK/SPINE:  No gross deformities, no edema, moves all extremities  SKIN:  no rash, quarter sized ulceration to the left medial ankle with minimal erythema with some faint spread proximally, no discharge    *Additional and/or pertinent findings included in MDM below  Diagnostic and Interventional Summary    EKG Interpretation Date/Time:    Ventricular Rate:    PR Interval:    QRS Duration:    QT Interval:    QTC Calculation:   R Axis:      Text Interpretation:         Labs Reviewed  BASIC METABOLIC PANEL WITH GFR - Abnormal; Notable for the following components:      Result Value   Glucose, Bld 110 (*)    Calcium  8.8 (*)    All other components within normal limits  CBC WITH DIFFERENTIAL/PLATELET - Abnormal; Notable for the following components:   RBC 3.48 (*)    Hemoglobin 10.7 (*)     HCT 34.6 (*)    All other components within normal limits  I-STAT CG4 LACTIC ACID, ED - Abnormal; Notable for the following components:   Lactic Acid, Venous 2.2 (*)    All other components within normal limits  CULTURE, BLOOD (ROUTINE X 2)  CULTURE, BLOOD (ROUTINE X 2)  I-STAT CG4 LACTIC ACID, ED  I-STAT CG4 LACTIC ACID, ED    DG Ankle Complete Left  Final Result      Medications  HYDROcodone -acetaminophen  (NORCO/VICODIN) 5-325 MG per tablet 2 tablet (2 tablets Oral Given 01/29/24 1906)  HYDROcodone -acetaminophen  (NORCO/VICODIN) 5-325 MG per tablet 2 tablet (2 tablets Oral Given 01/30/24 0110)  doxycycline  (VIBRA -TABS) tablet 100 mg (100 mg Oral Given 01/30/24 0110)     Procedures  /  Critical Care Procedures  ED Course and Medical Decision Making  I have reviewed the triage vital signs, the nursing notes, and pertinent available records from the EMR.  Social Determinants Affecting Complexity of Care: Patient has no clinically significant social determinants affecting this chief complaint..   ED Course: Clinical Course as of 01/30/24 0225  Tue Jan 30, 2024  0210 Stable 17 YOF with a chief complaint of ankle ulcer Doxycycline /Keflex . [CC]    Clinical Course User Index [CC] Jerral Meth, MD  Medical Decision Making Patient here with left lower extremity wound and ulcer.  Plain films are without evidence of osteomyelitis.  She has some very mild erythema which could be early cellulitis.  Will treat with Doxy and Keflex .  Prior history of MRSA.  Distal pulses are dopplerable.    Amount and/or Complexity of Data Reviewed Radiology: ordered.  Risk Prescription drug management.         Consultants: No consultations were needed in caring for this patient.   Treatment and Plan: I considered admission due to patient's initial presentation, but after considering the examination and diagnostic results, patient will not require admission and can be discharged  with outpatient follow-up.  Patient seen by and discussed with attending physician, Dr. Jerral, who referred to home health and wound care..  Final Clinical Impressions(s) / ED Diagnoses     ICD-10-CM   1. Cellulitis of left lower extremity  L03.116     2. Visit for wound check  Z51.89     3. Pressure injury of skin of left ankle, unspecified injury stage  L89.529       ED Discharge Orders          Ordered    AMB referral to wound care center        01/30/24 0224    oxyCODONE -acetaminophen  (PERCOCET) 5-325 MG tablet  Every 6 hours PRN        01/30/24 0225    doxycycline  (VIBRAMYCIN ) 100 MG capsule  2 times daily        01/30/24 0225    cephALEXin  (KEFLEX ) 500 MG capsule  2 times daily        01/30/24 0225              Discharge Instructions Discussed with and Provided to Patient:   Discharge Instructions   None      Vicky Charleston, PA-C 01/30/24 9772    Jerral Meth, MD 01/30/24 [phone]

## 2024-02-03 LAB — CULTURE, BLOOD (ROUTINE X 2)
Culture: NO GROWTH
Special Requests: ADEQUATE

## 2024-05-02 ENCOUNTER — Other Ambulatory Visit: Payer: Self-pay

## 2024-05-02 ENCOUNTER — Telehealth: Payer: Self-pay

## 2024-05-02 DIAGNOSIS — I739 Peripheral vascular disease, unspecified: Secondary | ICD-10-CM

## 2024-05-02 NOTE — Telephone Encounter (Addendum)
 Advice/Triage: -Son Tamara Johnson called stating pt has been in and out of the hospital with a wound on her ankle and her doctor told him maybe the vascular dr can check her out.  -son states her foot is not cool but warm, it is not discolored except for the redness around the wound at the ankle.  He confirms she has been battling this cellulitis for months and she will take antibiotics and it will get better then worse again. -he was hoping to get appt for [facility] for a test because its a lot closer  -able to book her recommended f/u studies from 2 years ago for Monday but no provider appt available at that  time.  If abnormal findings, please notify triage nurse or MD.

## 2024-05-03 ENCOUNTER — Other Ambulatory Visit: Payer: Self-pay | Admitting: *Deleted

## 2024-05-03 NOTE — Progress Notes (Signed)
 Patient will need follow up appt with provider scheduled

## 2024-05-06 ENCOUNTER — Ambulatory Visit (HOSPITAL_BASED_OUTPATIENT_CLINIC_OR_DEPARTMENT_OTHER)
Admission: RE | Admit: 2024-05-06 | Discharge: 2024-05-06 | Disposition: A | Discharge status: Home / Self Care | Source: Ambulatory Visit | Attending: Surgery | Admitting: Surgery

## 2024-05-06 ENCOUNTER — Ambulatory Visit (HOSPITAL_COMMUNITY)
Admission: RE | Admit: 2024-05-06 | Discharge: 2024-05-06 | Disposition: A | Discharge status: Home / Self Care | Source: Ambulatory Visit | Attending: Surgery | Admitting: Surgery

## 2024-05-06 DIAGNOSIS — I739 Peripheral vascular disease, unspecified: Secondary | ICD-10-CM | POA: Insufficient documentation

## 2024-05-06 LAB — VAS US ABI WITH/WO TBI
Left ABI: 0.59
Right ABI: 1.01

## 2024-07-29 DIAGNOSIS — I493 Ventricular premature depolarization: Secondary | ICD-10-CM | POA: Diagnosis not present

## 2024-07-29 DIAGNOSIS — R000 Tachycardia, unspecified: Secondary | ICD-10-CM | POA: Diagnosis not present

## 2024-07-29 DIAGNOSIS — R9431 Abnormal electrocardiogram [ECG] [EKG]: Secondary | ICD-10-CM | POA: Diagnosis not present
